# Patient Record
Sex: Female | Born: 1945 | Race: White | Hispanic: No | Smoking: Former smoker
Health system: Southern US, Community
[De-identification: ages and names within clinical notes are randomized; demographics above are authoritative.]

## PROBLEM LIST (undated history)

## (undated) DIAGNOSIS — I10 Essential (primary) hypertension: Secondary | ICD-10-CM

## (undated) DIAGNOSIS — J309 Allergic rhinitis, unspecified: Secondary | ICD-10-CM

## (undated) DIAGNOSIS — Z9889 Other specified postprocedural states: Secondary | ICD-10-CM

## (undated) DIAGNOSIS — R112 Nausea with vomiting, unspecified: Secondary | ICD-10-CM

## (undated) DIAGNOSIS — E785 Hyperlipidemia, unspecified: Secondary | ICD-10-CM

## (undated) DIAGNOSIS — F419 Anxiety disorder, unspecified: Secondary | ICD-10-CM

## (undated) DIAGNOSIS — T8859XA Other complications of anesthesia, initial encounter: Secondary | ICD-10-CM

## (undated) DIAGNOSIS — G629 Polyneuropathy, unspecified: Secondary | ICD-10-CM

## (undated) DIAGNOSIS — M1990 Unspecified osteoarthritis, unspecified site: Secondary | ICD-10-CM

## (undated) DIAGNOSIS — T7840XA Allergy, unspecified, initial encounter: Secondary | ICD-10-CM

## (undated) DIAGNOSIS — T4145XA Adverse effect of unspecified anesthetic, initial encounter: Secondary | ICD-10-CM

## (undated) HISTORY — PX: COLONOSCOPY: SHX174

## (undated) HISTORY — PX: TUBAL LIGATION: SHX77

---

## 2005-09-07 ENCOUNTER — Ambulatory Visit: Payer: Self-pay | Admitting: Internal Medicine

## 2006-09-19 ENCOUNTER — Ambulatory Visit: Payer: Self-pay | Admitting: Internal Medicine

## 2009-01-05 ENCOUNTER — Ambulatory Visit: Payer: Self-pay | Admitting: Internal Medicine

## 2009-05-30 ENCOUNTER — Ambulatory Visit: Payer: Self-pay | Admitting: Gastroenterology

## 2010-01-06 ENCOUNTER — Ambulatory Visit: Payer: Self-pay | Admitting: Internal Medicine

## 2011-02-28 ENCOUNTER — Ambulatory Visit: Payer: Self-pay | Admitting: Internal Medicine

## 2012-03-03 ENCOUNTER — Ambulatory Visit: Payer: Self-pay | Admitting: Internal Medicine

## 2012-03-31 ENCOUNTER — Encounter: Payer: Self-pay | Admitting: Rheumatology

## 2012-04-12 ENCOUNTER — Encounter: Payer: Self-pay | Admitting: Rheumatology

## 2013-03-23 ENCOUNTER — Ambulatory Visit: Payer: Self-pay | Admitting: Internal Medicine

## 2014-04-16 DIAGNOSIS — F411 Generalized anxiety disorder: Secondary | ICD-10-CM | POA: Insufficient documentation

## 2014-04-16 DIAGNOSIS — I10 Essential (primary) hypertension: Secondary | ICD-10-CM | POA: Insufficient documentation

## 2014-04-16 DIAGNOSIS — J302 Other seasonal allergic rhinitis: Secondary | ICD-10-CM | POA: Insufficient documentation

## 2014-05-20 ENCOUNTER — Ambulatory Visit: Payer: Self-pay | Admitting: Family Medicine

## 2015-07-12 ENCOUNTER — Other Ambulatory Visit: Payer: Self-pay | Admitting: Family Medicine

## 2015-07-12 DIAGNOSIS — Z1231 Encounter for screening mammogram for malignant neoplasm of breast: Secondary | ICD-10-CM

## 2015-07-26 ENCOUNTER — Ambulatory Visit
Admission: RE | Admit: 2015-07-26 | Discharge: 2015-07-26 | Disposition: A | Discharge status: Home / Self Care | Payer: PPO | Source: Ambulatory Visit | Attending: Family Medicine | Admitting: Family Medicine

## 2015-07-26 DIAGNOSIS — Z1231 Encounter for screening mammogram for malignant neoplasm of breast: Secondary | ICD-10-CM | POA: Diagnosis not present

## 2015-10-25 ENCOUNTER — Other Ambulatory Visit: Payer: Self-pay | Admitting: Family Medicine

## 2015-10-25 DIAGNOSIS — R1011 Right upper quadrant pain: Secondary | ICD-10-CM

## 2015-10-26 ENCOUNTER — Ambulatory Visit
Admission: RE | Admit: 2015-10-26 | Discharge: 2015-10-26 | Disposition: A | Discharge status: Home / Self Care | Payer: PPO | Source: Ambulatory Visit | Attending: Family Medicine | Admitting: Family Medicine

## 2015-10-26 DIAGNOSIS — R1011 Right upper quadrant pain: Secondary | ICD-10-CM | POA: Diagnosis not present

## 2015-11-02 ENCOUNTER — Emergency Department: Payer: PPO

## 2015-11-02 ENCOUNTER — Encounter: Payer: Self-pay | Admitting: *Deleted

## 2015-11-02 ENCOUNTER — Inpatient Hospital Stay
Admission: EM | Admit: 2015-11-02 | Discharge: 2015-11-04 | DRG: 641 | Disposition: A | Discharge status: Home / Self Care | Payer: PPO | Attending: Specialist | Admitting: Specialist

## 2015-11-02 DIAGNOSIS — E869 Volume depletion, unspecified: Secondary | ICD-10-CM | POA: Diagnosis present

## 2015-11-02 DIAGNOSIS — R634 Abnormal weight loss: Secondary | ICD-10-CM | POA: Diagnosis present

## 2015-11-02 DIAGNOSIS — R1084 Generalized abdominal pain: Secondary | ICD-10-CM

## 2015-11-02 DIAGNOSIS — J309 Allergic rhinitis, unspecified: Secondary | ICD-10-CM | POA: Diagnosis present

## 2015-11-02 DIAGNOSIS — E876 Hypokalemia: Secondary | ICD-10-CM | POA: Diagnosis present

## 2015-11-02 DIAGNOSIS — E871 Hypo-osmolality and hyponatremia: Secondary | ICD-10-CM | POA: Diagnosis present

## 2015-11-02 DIAGNOSIS — F419 Anxiety disorder, unspecified: Secondary | ICD-10-CM | POA: Diagnosis present

## 2015-11-02 DIAGNOSIS — R197 Diarrhea, unspecified: Secondary | ICD-10-CM

## 2015-11-02 DIAGNOSIS — I10 Essential (primary) hypertension: Secondary | ICD-10-CM | POA: Diagnosis present

## 2015-11-02 DIAGNOSIS — F329 Major depressive disorder, single episode, unspecified: Secondary | ICD-10-CM | POA: Diagnosis present

## 2015-11-02 DIAGNOSIS — Z881 Allergy status to other antibiotic agents status: Secondary | ICD-10-CM | POA: Diagnosis not present

## 2015-11-02 DIAGNOSIS — R6881 Early satiety: Secondary | ICD-10-CM | POA: Diagnosis present

## 2015-11-02 DIAGNOSIS — E785 Hyperlipidemia, unspecified: Secondary | ICD-10-CM | POA: Diagnosis present

## 2015-11-02 DIAGNOSIS — Z87891 Personal history of nicotine dependence: Secondary | ICD-10-CM

## 2015-11-02 DIAGNOSIS — T3695XA Adverse effect of unspecified systemic antibiotic, initial encounter: Secondary | ICD-10-CM | POA: Diagnosis present

## 2015-11-02 DIAGNOSIS — K219 Gastro-esophageal reflux disease without esophagitis: Secondary | ICD-10-CM | POA: Diagnosis present

## 2015-11-02 DIAGNOSIS — R112 Nausea with vomiting, unspecified: Secondary | ICD-10-CM | POA: Diagnosis present

## 2015-11-02 DIAGNOSIS — Z79899 Other long term (current) drug therapy: Secondary | ICD-10-CM

## 2015-11-02 DIAGNOSIS — R1110 Vomiting, unspecified: Secondary | ICD-10-CM

## 2015-11-02 DIAGNOSIS — Z6821 Body mass index (BMI) 21.0-21.9, adult: Secondary | ICD-10-CM

## 2015-11-02 DIAGNOSIS — R630 Anorexia: Secondary | ICD-10-CM | POA: Diagnosis present

## 2015-11-02 DIAGNOSIS — R1310 Dysphagia, unspecified: Secondary | ICD-10-CM | POA: Diagnosis present

## 2015-11-02 HISTORY — DX: Hyperlipidemia, unspecified: E78.5

## 2015-11-02 HISTORY — DX: Essential (primary) hypertension: I10

## 2015-11-02 HISTORY — DX: Allergy, unspecified, initial encounter: T78.40XA

## 2015-11-02 HISTORY — DX: Anxiety disorder, unspecified: F41.9

## 2015-11-02 HISTORY — DX: Unspecified osteoarthritis, unspecified site: M19.90

## 2015-11-02 HISTORY — DX: Allergic rhinitis, unspecified: J30.9

## 2015-11-02 LAB — URINALYSIS COMPLETE WITH MICROSCOPIC (ARMC ONLY)
Bilirubin Urine: NEGATIVE
Glucose, UA: NEGATIVE mg/dL
Ketones, ur: NEGATIVE mg/dL
NITRITE: NEGATIVE
PH: 6 (ref 5.0–8.0)
Protein, ur: NEGATIVE mg/dL
Specific Gravity, Urine: 1.008 (ref 1.005–1.030)

## 2015-11-02 LAB — COMPREHENSIVE METABOLIC PANEL
ALBUMIN: 4.2 g/dL (ref 3.5–5.0)
ALT: 26 U/L (ref 14–54)
ANION GAP: 10 (ref 5–15)
AST: 29 U/L (ref 15–41)
Alkaline Phosphatase: 43 U/L (ref 38–126)
BUN: 9 mg/dL (ref 6–20)
CHLORIDE: 87 mmol/L — AB (ref 101–111)
CO2: 23 mmol/L (ref 22–32)
Calcium: 8.7 mg/dL — ABNORMAL LOW (ref 8.9–10.3)
Creatinine, Ser: 0.78 mg/dL (ref 0.44–1.00)
GFR calc non Af Amer: >60 mL/min (ref >60)
GLUCOSE: 144 mg/dL — AB (ref 65–99)
POTASSIUM: 3 mmol/L — AB (ref 3.5–5.1)
SODIUM: 120 mmol/L — AB (ref 135–145)
Total Bilirubin: 0.5 mg/dL (ref 0.3–1.2)
Total Protein: 6.8 g/dL (ref 6.5–8.1)

## 2015-11-02 LAB — CBC WITH DIFFERENTIAL/PLATELET
BASOS PCT: 0 %
Basophils Absolute: 0 10*3/uL (ref 0–0.1)
EOS ABS: 0 10*3/uL (ref 0–0.7)
EOS PCT: 0 %
HCT: 36.6 % (ref 35.0–47.0)
HEMOGLOBIN: 12.4 g/dL (ref 12.0–16.0)
Lymphocytes Relative: 22 %
Lymphs Abs: 1 10*3/uL (ref 1.0–3.6)
MCH: 31.4 pg (ref 26.0–34.0)
MCHC: 33.9 g/dL (ref 32.0–36.0)
MCV: 92.6 fL (ref 80.0–100.0)
MONO ABS: 0.5 10*3/uL (ref 0.2–0.9)
MONOS PCT: 11 %
NEUTROS PCT: 67 %
Neutro Abs: 3.2 10*3/uL (ref 1.4–6.5)
PLATELETS: 335 10*3/uL (ref 150–440)
RBC: 3.95 MIL/uL (ref 3.80–5.20)
RDW: 13.2 % (ref 11.5–14.5)
WBC: 4.8 10*3/uL (ref 3.6–11.0)

## 2015-11-02 LAB — LIPASE, BLOOD: Lipase: 51 U/L (ref 11–51)

## 2015-11-02 LAB — MAGNESIUM: MAGNESIUM: 1.8 mg/dL (ref 1.7–2.4)

## 2015-11-02 LAB — TROPONIN I

## 2015-11-02 MED ORDER — FLUTICASONE PROPIONATE 50 MCG/ACT NA SUSP
1.0000 | Freq: Every day | NASAL | Status: DC
  Administered 2015-11-03: 21:00:00 2 via NASAL
  Filled 2015-11-02 (×2): qty 16

## 2015-11-02 MED ORDER — TRAMADOL HCL 50 MG PO TABS
50.0000 mg | ORAL_TABLET | Freq: Two times a day (BID) | ORAL | Status: DC | PRN
  Administered 2015-11-03: 50 mg via ORAL
  Filled 2015-11-02: qty 1

## 2015-11-02 MED ORDER — ONDANSETRON HCL 4 MG/2ML IJ SOLN: 4.0000 mg | Freq: Four times a day (QID) | INTRAMUSCULAR | Status: DC | PRN

## 2015-11-02 MED ORDER — ONDANSETRON HCL 4 MG PO TABS: 4.0000 mg | ORAL_TABLET | Freq: Four times a day (QID) | ORAL | Status: DC | PRN

## 2015-11-02 MED ORDER — LORAZEPAM 0.5 MG PO TABS: 0.5000 mg | ORAL_TABLET | Freq: Every day | ORAL | Status: DC

## 2015-11-02 MED ORDER — LORAZEPAM 0.5 MG PO TABS
0.5000 mg | ORAL_TABLET | Freq: Every day | ORAL | Status: DC
  Administered 2015-11-02 – 2015-11-03 (×2): 0.5 mg via ORAL
  Filled 2015-11-02 (×2): qty 1

## 2015-11-02 MED ORDER — ONDANSETRON HCL 4 MG/2ML IJ SOLN: 4.0000 mg | Freq: Once | INTRAMUSCULAR | Status: DC

## 2015-11-02 MED ORDER — SIMVASTATIN 20 MG PO TABS: 20.0000 mg | ORAL_TABLET | Freq: Every day | ORAL | Status: DC

## 2015-11-02 MED ORDER — CITALOPRAM HYDROBROMIDE 20 MG PO TABS
20.0000 mg | ORAL_TABLET | Freq: Every day | ORAL | Status: DC
  Administered 2015-11-03: 09:00:00 20 mg via ORAL
  Filled 2015-11-02: qty 1

## 2015-11-02 MED ORDER — ENOXAPARIN SODIUM 40 MG/0.4ML ~~LOC~~ SOLN
40.0000 mg | SUBCUTANEOUS | Status: DC
  Administered 2015-11-02: 22:00:00 40 mg via SUBCUTANEOUS
  Filled 2015-11-02: qty 0.4

## 2015-11-02 MED ORDER — ACETAMINOPHEN 650 MG RE SUPP: 650.0000 mg | Freq: Four times a day (QID) | RECTAL | Status: DC | PRN

## 2015-11-02 MED ORDER — LOSARTAN POTASSIUM 50 MG PO TABS
50.0000 mg | ORAL_TABLET | Freq: Every day | ORAL | Status: DC
  Administered 2015-11-02 – 2015-11-03 (×2): 50 mg via ORAL
  Filled 2015-11-02: qty 1
  Filled 2015-11-02: qty 2

## 2015-11-02 MED ORDER — ACETAMINOPHEN 325 MG PO TABS: 650.0000 mg | ORAL_TABLET | Freq: Four times a day (QID) | ORAL | Status: DC | PRN

## 2015-11-02 MED ORDER — SIMVASTATIN 20 MG PO TABS
20.0000 mg | ORAL_TABLET | Freq: Every day | ORAL | Status: DC
  Administered 2015-11-02 – 2015-11-03 (×2): 20 mg via ORAL
  Filled 2015-11-02 (×2): qty 1

## 2015-11-02 MED ORDER — POTASSIUM CHLORIDE IN NACL 40-0.9 MEQ/L-% IV SOLN
INTRAVENOUS | Status: DC
  Administered 2015-11-02 – 2015-11-04 (×4): 100 mL/h via INTRAVENOUS
  Filled 2015-11-02 (×6): qty 1000

## 2015-11-02 MED ORDER — PANTOPRAZOLE SODIUM 40 MG PO TBEC
40.0000 mg | DELAYED_RELEASE_TABLET | Freq: Two times a day (BID) | ORAL | Status: DC
  Administered 2015-11-02 – 2015-11-03 (×3): 40 mg via ORAL
  Filled 2015-11-02 (×3): qty 1

## 2015-11-02 MED ORDER — MONTELUKAST SODIUM 10 MG PO TABS
10.0000 mg | ORAL_TABLET | Freq: Every day | ORAL | Status: DC
  Administered 2015-11-03: 10 mg via ORAL
  Filled 2015-11-02: qty 1

## 2015-11-02 MED ORDER — SODIUM CHLORIDE 0.9 % IV BOLUS (SEPSIS)
1000.0000 mL | Freq: Once | INTRAVENOUS | Status: AC
  Administered 2015-11-02: 1000 mL via INTRAVENOUS

## 2015-11-02 NOTE — ED Notes (Signed)
Pt arrives via EMS with nausea, vomiting, and diaherra, states last Tuesday she was diagnoised with h.pylori at [facility] clinic, EMS gave 8 mg IV zofran in route

## 2015-11-02 NOTE — H&P (Signed)
[facility] at [place] NAME: Brittney Ramsey    MR#:  [id number]  DATE OF BIRTH:  [birth year]  DATE OF ADMISSION:  11/02/2015  PRIMARY CARE PHYSICIAN: BABAOFF, Caryl Bis, MD   REQUESTING/REFERRING PHYSICIAN: Dr Clearnce Hasten  CHIEF COMPLAINT:  Nausea vomiting diarrhea for 3-4 days.  HISTORY OF PRESENT ILLNESS:  Brittney Ramsey  is a 69 y.o. female with a known history of hyperlipidemia, anxiety, hypertension, allergic rhinitis who was recently diagnosed with H. pylori as outpatient. Patient was started on amoxicillin, Flagyl and Prevacid by her primary care physician on 10/25/2015. Patient thereafter started having some nausea vomiting intermittently along with diarrhea. She came to the emergency room feeling very weak was found to have sodium of 120 and potassium of 3.0. She clinically appears dehydrated she is being admitted for further evaluation and management  PAST MEDICAL HISTORY:  Hypertension History of hemorrhoids Hyperlipidemia Osteoarthritis  PAST SURGICAL HISTOIRY:  History reviewed. No pertinent past surgical history.  SOCIAL HISTORY:   Social History  Substance Use Topics  . Smoking status: Not on file  . Smokeless tobacco: Not on file  . Alcohol Use: Not on file    FAMILY HISTORY:  Hypertension  DRUG ALLERGIES:   Allergies  Allergen Reactions  . Biaxin [Clarithromycin]     Reaction: stomach cramps  . Erythromycin     Reaction: stomach cramps    REVIEW OF SYSTEMS:  Review of Systems  Constitutional: Positive for malaise/fatigue. Negative for fever, chills and weight loss.  HENT: Negative for ear discharge, ear pain and nosebleeds.   Eyes: Negative for blurred vision, pain and discharge.  Respiratory: Negative for sputum production, shortness of breath, wheezing and stridor.   Cardiovascular: Negative for chest pain, palpitations, orthopnea and PND.  Gastrointestinal: Positive for nausea, vomiting and diarrhea.  Negative for abdominal pain.  Genitourinary: Negative for urgency and frequency.  Musculoskeletal: Negative for back pain and joint pain.  Neurological: Positive for weakness. Negative for sensory change, speech change and focal weakness.  Psychiatric/Behavioral: Negative for depression and hallucinations. The patient is not nervous/anxious.   All other systems reviewed and are negative.    MEDICATIONS AT HOME:   Prior to Admission medications   Medication Sig Start Date End Date Taking? Authorizing Provider  amoxicillin (AMOXIL) 500 MG capsule Take 1,000 mg by mouth 2 (two) times daily. For 14 days 10/26/15  Yes Historical Provider, MD  citalopram (CELEXA) 20 MG tablet Take 20 mg by mouth daily.   Yes Historical Provider, MD  fluticasone (FLONASE) 50 MCG/ACT nasal spray Place 1-2 sprays into both nostrils daily at 8 pm.   Yes Historical Provider, MD  LORazepam (ATIVAN) 0.5 MG tablet Take 0.5 mg by mouth daily.   Yes Historical Provider, MD  losartan-hydrochlorothiazide (HYZAAR) 50-12.5 MG tablet Take 1 tablet by mouth daily.   Yes Historical Provider, MD  meloxicam (MOBIC) 7.5 MG tablet Take 7.5 mg by mouth daily at 8 pm.   Yes Historical Provider, MD  metroNIDAZOLE (FLAGYL) 500 MG tablet Take 500 mg by mouth 2 (two) times daily. For 14 days 10/26/15  Yes Historical Provider, MD  montelukast (SINGULAIR) 10 MG tablet Take 10 mg by mouth daily.   Yes Historical Provider, MD  pantoprazole (PROTONIX) 40 MG tablet Take 40 mg by mouth 2 (two) times daily.   Yes Historical Provider, MD  simvastatin (ZOCOR) 20 MG tablet Take 20 mg by mouth daily at 6 PM.   Yes Historical Provider, MD  traMADol (ULTRAM) 50 MG tablet Take 50 mg by mouth 2 (two) times daily as needed.   Yes Historical Provider, MD      VITAL SIGNS:  Blood pressure 156/61, pulse 78, temperature 97.7 F (36.5 C), temperature source Oral, resp. rate 12, height 5\' 3"  (1.6 m), weight 62.596 kg (138 lb), SpO2 97 %.  PHYSICAL  EXAMINATION:  GENERAL:  69 y.o.-year-old patient lying in the bed with no acute distress.  EYES: Pupils equal, round, reactive to light and accommodation. No scleral icterus. Extraocular muscles intact. Dry oral mucosa HEENT: Head atraumatic, normocephalic. Oropharynx and nasopharynx clear.  NECK:  Supple, no jugular venous distention. No thyroid enlargement, no tenderness.  LUNGS: Normal breath sounds bilaterally, no wheezing, rales,rhonchi or crepitation. No use of accessory muscles of respiration.  CARDIOVASCULAR: S1, S2 normal. No murmurs, rubs, or gallops.  ABDOMEN: Soft, nontender, nondistended. Bowel sounds present. No organomegaly or mass.  EXTREMITIES: No pedal edema, cyanosis, or clubbing.  NEUROLOGIC: Cranial nerves II through XII are intact. Muscle strength 5/5 in all extremities. Sensation intact. Gait not checked. Appears weak but PSYCHIATRIC: The patient is alert and oriented x 3.  SKIN: No obvious rash, lesion, or ulcer.   LABORATORY PANEL:   CBC  Recent Labs Lab 11/02/15 1456  WBC 4.8  HGB 12.4  HCT 36.6  PLT 335   ------------------------------------------------------------------------------------------------------------------  Chemistries   Recent Labs Lab 11/02/15 1456  NA 120*  K 3.0*  CL 87*  CO2 23  GLUCOSE 144*  BUN 9  CREATININE 0.78  CALCIUM 8.7*  MG 1.8  AST 29  ALT 26  ALKPHOS 43  BILITOT 0.5   ------------------------------------------------------------------------------------------------------------------  Cardiac Enzymes  Recent Labs Lab 11/02/15 1456  TROPONINI <0.03   ------------------------------------------------------------------------------------------------------------------  RADIOLOGY:  Dg Chest 1 View  11/02/2015  CLINICAL DATA:  Nausea vomiting and diarrhea; history of recent diagnosis of Helicobacter pylori. EXAM: CHEST 1 VIEW COMPARISON:  None in PACs FINDINGS: The lungs are well-expanded and clear. The heart and  pulmonary vascularity are normal. The mediastinum is normal in width. There is no pleural effusion. The bony thorax exhibits no acute abnormality. IMPRESSION: There is no active cardiopulmonary disease. Electronically Signed   By: David  Martinique M.D.   On: 11/02/2015 16:02    EKG:   Sinus rhythm IMPRESSION AND PLAN:  Brittney Ramsey  is a 69 y.o. female with a known history of hyperlipidemia, anxiety, hypertension, allergic rhinitis who was recently diagnosed with H. pylori as outpatient. Patient was started on amoxicillin, Flagyl and Prevacid by her primary care physician on 10/25/2015 Patient is being admitted with  1. Acute hyponatremia, hypokalemia, hypochloremia secondary to GI losses likely from side effect of treatment of H. pylori medications -Patient was started on amoxicillin, Flagyl, Prevacid 10/25/2015 by PCP. Patient internally started having nausea vomiting diarrhea. -Admit to medical floor -Clear liquid diet -IV normal saline with KCl. Check labs in the morning. Check magnesium. -Advance diet as tolerated  2. Hyperlipidemia continue simvastatin  3. Hypertension We'll continue losartan. I'll hold off on hydrochlorothiazide given dehydration and hyponatremia for now  4. H. pylori recent diagnosis Consider prescribing different regimen prior to discharge.   5. DVT prophylaxis Lovenox    Above was discussed with patient and patient's daughter in the ER.   All the records are reviewed and case discussed with ED provider. Management plans discussed with the patient, family and they are in agreement.  CODE STATUS: Full code   TOTAL TIME TAKING CARE OF THIS PATIENT: 68*  minutes.    Brittney Ramsey M.D on 11/02/2015 at 5:57 PM  Between 7am to 6pm - Pager - [phone]  After 6pm go to [email or web address] - password [email or web address] [place]  Office  [phone]  CC: Primary care physician; BABAOFF, Caryl Bis, MD

## 2015-11-02 NOTE — ED Provider Notes (Signed)
[facility] Emergency Department Provider Note  ____________________________________________  Time seen: Approximately 315 PM  I have reviewed the triage vital signs and the nursing notes.   HISTORY  Chief Complaint Nausea; Emesis; and Diarrhea    HPI Brittney Ramsey is a 69 y.o. female with a recent diagnosis of H. pylori and is presenting today with nausea vomiting and diarrhea. She says that she started taking amoxicillin and Flagyl last Tuesday and then almost immediately began having the nausea vomiting and diarrhea. She says she may have up to 4 episodes of diarrhea per day. Says she had none yesterday. However, resumed today. Denies any blood in the stool or vomitus. Denies any bilious vomiting. Says that she also has diffuse abdominal cramping. Denies any shortness of breath or chest pain. She also had a recent, and negative, right upper quadrant ultrasound.Says she decided to come in to the emergency department day because she became so weak over the last 2 weeks that she wasn't able to get up from her commode today without assistance. Also notes 17 pound weight loss over the past 4 months that was unintentional.   History reviewed. No pertinent past medical history.  There are no active problems to display for this patient.   History reviewed. No pertinent past surgical history.  Current Outpatient Rx  Name  Route  Sig  Dispense  Refill  . amoxicillin (AMOXIL) 500 MG capsule   Oral   Take 1,000 mg by mouth 2 (two) times daily. For 14 days      0   . citalopram (CELEXA) 20 MG tablet   Oral   Take 20 mg by mouth daily.         . fluticasone (FLONASE) 50 MCG/ACT nasal spray   Each Nare   Place 1-2 sprays into both nostrils daily at 8 pm.      5   . LORazepam (ATIVAN) 0.5 MG tablet   Oral   Take 0.5 mg by mouth daily.      5   . losartan-hydrochlorothiazide (HYZAAR) 50-12.5 MG tablet   Oral   Take 1 tablet by mouth daily.      1   . meloxicam (MOBIC) 7.5 MG tablet   Oral   Take 7.5 mg by mouth daily at 8 pm.      5   . metroNIDAZOLE (FLAGYL) 500 MG tablet   Oral   Take 500 mg by mouth 2 (two) times daily. For 14 days      0   . montelukast (SINGULAIR) 10 MG tablet   Oral   Take 10 mg by mouth daily.      3   . pantoprazole (PROTONIX) 40 MG tablet   Oral   Take 40 mg by mouth 2 (two) times daily.      3   . simvastatin (ZOCOR) 20 MG tablet   Oral   Take 20 mg by mouth daily at 6 PM.      3   . traMADol (ULTRAM) 50 MG tablet   Oral   Take 50 mg by mouth 2 (two) times daily as needed.      0     Allergies Biaxin and Erythromycin  History reviewed. No pertinent family history.  Social History Social History  Substance Use Topics  . Smoking status: None  . Smokeless tobacco: None  . Alcohol Use: None    Review of Systems Constitutional: No fever/chills Eyes: No visual changes. ENT: No sore throat.  Cardiovascular: Denies chest pain. Respiratory: Denies shortness of breath. Gastrointestinal: No constipation. Genitourinary: Negative for dysuria. Musculoskeletal: Negative for back pain. Skin: Negative for rash. Neurological: Negative for headaches, focal weakness or numbness.  10-point ROS otherwise negative.  ____________________________________________   PHYSICAL EXAM:  VITAL SIGNS: ED Triage Vitals  Enc Vitals Group     BP 11/02/15 1454 199/77 mmHg     Pulse Rate 11/02/15 1454 74     Resp 11/02/15 1454 18     Temp 11/02/15 1454 97.7 F (36.5 C)     Temp Source 11/02/15 1454 Oral     SpO2 11/02/15 1451 98 %     Weight 11/02/15 1454 138 lb (62.596 kg)     Height 11/02/15 1454 5\' 3"  (1.6 m)     Head Cir --      Peak Flow --      Pain Score 11/02/15 1454 9     Pain Loc --      Pain Edu? --      Excl. in [place]? --     Constitutional: Alert and oriented. Well appearing and in no acute distress. Eyes: Conjunctivae are normal. PERRL. EOMI. Head: Atraumatic. Nose: No  congestion/rhinnorhea. Mouth/Throat: Mucous membranes are moist.  Oropharynx non-erythematous. Neck: No stridor.   Cardiovascular: Normal rate, regular rhythm. Grossly normal heart sounds.  Good peripheral circulation. Respiratory: Normal respiratory effort.  No retractions. Lungs CTAB. Gastrointestinal: Soft with diffuse tenderness palpation. No distention. No CVA tenderness. Musculoskeletal: No lower extremity tenderness nor edema.  No joint effusions. Neurologic:  Normal speech and language. No gross focal neurologic deficits are appreciated. Patient is tremulous to her bilateral upper extremities. Skin:  Skin is warm, dry and intact. No rash noted. Psychiatric: Mood and affect are normal. Speech and behavior are normal.  ____________________________________________   LABS (all labs ordered are listed, but only abnormal results are displayed)  Labs Reviewed  COMPREHENSIVE METABOLIC PANEL - Abnormal; Notable for the following:    Sodium 120 (*)    Potassium 3.0 (*)    Chloride 87 (*)    Glucose, Bld 144 (*)    Calcium 8.7 (*)    All other components within normal limits  URINALYSIS COMPLETEWITH MICROSCOPIC ([facility] ONLY) - Abnormal; Notable for the following:    Color, Urine YELLOW (*)    APPearance HAZY (*)    Hgb urine dipstick 2+ (*)    Leukocytes, UA TRACE (*)    Bacteria, UA MANY (*)    Squamous Epithelial / LPF 0-5 (*)    All other components within normal limits  CBC WITH DIFFERENTIAL/PLATELET  LIPASE, BLOOD  TROPONIN I  MAGNESIUM   ____________________________________________  EKG  ED ECG REPORT I, Doran Stabler, the attending physician, personally viewed and interpreted this ECG.   Date: 11/02/2015  EKG Time: 1454  Rate: 73  Rhythm: normal EKG, normal sinus rhythm  Axis: Normal axis  Intervals:none  ST&T Change: No ST segment elevation or depression. No abnormal T-wave inversion.  ____________________________________________  RADIOLOGY  No acute  findings on the chest x-ray. ____________________________________________   PROCEDURES  CRITICAL CARE Performed by: Doran Stabler   Total critical care time: 35 minutes  Critical care time was exclusive of separately billable procedures and treating other patients.  Critical care was necessary to treat or prevent imminent or life-threatening deterioration.  Critical care was time spent personally by me on the following activities: development of treatment plan with patient and/or surrogate as well as nursing, discussions  with consultants, evaluation of patient's response to treatment, examination of patient, obtaining history from patient or surrogate, ordering and performing treatments and interventions, ordering and review of laboratory studies, ordering and review of radiographic studies, pulse oximetry and re-evaluation of patient's condition.   ____________________________________________   INITIAL IMPRESSION / ASSESSMENT AND PLAN / ED COURSE  Pertinent labs & imaging results that were available during my care of the patient were reviewed by me and considered in my medical decision making (see chart for details).  ----------------------------------------- 5:32 PM on 11/02/2015 -----------------------------------------  Patient without any vomiting throughout the ED visit after Zofran. Found to have sodium of 120 likely secondary to the vomiting and diarrhea. We'll admit to the hospital for further electrolyte correction. Feel that the diarrhea and vomiting are likely secondary to antibiotic side effects. The time when does not fit with C. difficile. Patient and family aware of need for admission. Signed out to Dr. Posey Pronto. ____________________________________________   FINAL CLINICAL IMPRESSION(S) / ED DIAGNOSES  Nausea vomiting and diarrhea with diffuse abdominal pain. Hyponatremia.    Orbie Pyo, MD 11/02/15 [phone]

## 2015-11-02 NOTE — ED Notes (Signed)
Admitting at bedside 

## 2015-11-03 LAB — TSH: TSH: 1.258 u[IU]/mL (ref 0.350–4.500)

## 2015-11-03 LAB — BASIC METABOLIC PANEL
Anion gap: 8 (ref 5–15)
BUN: 6 mg/dL (ref 6–20)
CO2: 26 mmol/L (ref 22–32)
CREATININE: 0.7 mg/dL (ref 0.44–1.00)
Calcium: 8.6 mg/dL — ABNORMAL LOW (ref 8.9–10.3)
Chloride: 94 mmol/L — ABNORMAL LOW (ref 101–111)
GFR calc Af Amer: >60 mL/min (ref >60)
Glucose, Bld: 106 mg/dL — ABNORMAL HIGH (ref 65–99)
POTASSIUM: 3.5 mmol/L (ref 3.5–5.1)
SODIUM: 128 mmol/L — AB (ref 135–145)

## 2015-11-03 MED ORDER — BOOST / RESOURCE BREEZE PO LIQD
1.0000 | Freq: Three times a day (TID) | ORAL | Status: DC
  Administered 2015-11-03 (×2): 1 via ORAL

## 2015-11-03 NOTE — Progress Notes (Signed)
[facility] at [place] NAME: Marnee Wilshusen    MR#:  [id number]  DATE OF BIRTH:  06/02/1946  SUBJECTIVE:   Patient here due to nausea and vomiting and noted to be hypokalemic and also hyponatremic. Feels a bit better today and electrolytes have improved.  REVIEW OF SYSTEMS:    Review of Systems  Constitutional: Negative for fever and chills.  HENT: Negative for congestion and tinnitus.   Eyes: Negative for blurred vision and double vision.  Respiratory: Negative for cough, shortness of breath and wheezing.   Cardiovascular: Negative for chest pain, orthopnea and PND.  Gastrointestinal: Negative for nausea, vomiting, abdominal pain and diarrhea.  Genitourinary: Negative for dysuria and hematuria.  Neurological: Negative for dizziness, sensory change and focal weakness.  All other systems reviewed and are negative.   Nutrition: Clear liquids Tolerating Diet: Yes Tolerating PT: Ambulatory.      DRUG ALLERGIES:   Allergies  Allergen Reactions  . Biaxin [Clarithromycin]     Reaction: stomach cramps  . Erythromycin     Reaction: stomach cramps    VITALS:  Blood pressure 156/60, pulse 69, temperature 98.5 F (36.9 C), temperature source Oral, resp. rate 20, height 5\' 3"  (1.6 m), weight 55.475 kg (122 lb 4.8 oz), SpO2 100 %.  PHYSICAL EXAMINATION:   Physical Exam  GENERAL:  69 y.o.-year-old patient lying in the bed with no acute distress.  EYES: Pupils equal, round, reactive to light and accommodation. No scleral icterus. Extraocular muscles intact.  HEENT: Head atraumatic, normocephalic. Oropharynx and nasopharynx clear.  NECK:  Supple, no jugular venous distention. No thyroid enlargement, no tenderness.  LUNGS: Normal breath sounds bilaterally, no wheezing, rales, rhonchi. No use of accessory muscles of respiration.  CARDIOVASCULAR: S1, S2 normal. No murmurs, rubs, or gallops.  ABDOMEN: Soft, nontender, nondistended.  Bowel sounds present. No organomegaly or mass.  EXTREMITIES: No cyanosis, clubbing or edema b/l.    NEUROLOGIC: Cranial nerves II through XII are intact. No focal Motor or sensory deficits b/l.   PSYCHIATRIC: The patient is alert and oriented x 3.  SKIN: No obvious rash, lesion, or ulcer.    LABORATORY PANEL:   CBC  Recent Labs Lab 11/02/15 1456  WBC 4.8  HGB 12.4  HCT 36.6  PLT 335   ------------------------------------------------------------------------------------------------------------------  Chemistries   Recent Labs Lab 11/02/15 1456 11/03/15 0729  NA 120* 128*  K 3.0* 3.5  CL 87* 94*  CO2 23 26  GLUCOSE 144* 106*  BUN 9 6  CREATININE 0.78 0.70  CALCIUM 8.7* 8.6*  MG 1.8  --   AST 29  --   ALT 26  --   ALKPHOS 43  --   BILITOT 0.5  --    ------------------------------------------------------------------------------------------------------------------  Cardiac Enzymes  Recent Labs Lab 11/02/15 1456  TROPONINI <0.03   ------------------------------------------------------------------------------------------------------------------  RADIOLOGY:  Dg Chest 1 View  11/02/2015  CLINICAL DATA:  Nausea vomiting and diarrhea; history of recent diagnosis of Helicobacter pylori. EXAM: CHEST 1 VIEW COMPARISON:  None in PACs FINDINGS: The lungs are well-expanded and clear. The heart and pulmonary vascularity are normal. The mediastinum is normal in width. There is no pleural effusion. The bony thorax exhibits no acute abnormality. IMPRESSION: There is no active cardiopulmonary disease. Electronically Signed   By: David  Martinique M.D.   On: 11/02/2015 16:02     ASSESSMENT AND PLAN:   69 year old female with past medical history of hypertension, anxiety, hyperlipidemia, history of allergic rhinitis who presented  to the hospital with nausea vomiting and noted to be hypokalemic and hyponatremic.  #1 hyponatremia-this was likely secondary to volume loss from nausea  and vomiting. -Continue IV fluids and it has improved.  #2 hypokalemia-also due to the intractable nausea vomiting. -It has been supplemented and is improving and we will monitor.  #3 intractable nausea and vomiting-due to the antibiotics she was taking to treat her H. pylori infection -hold Flagyl and amoxicillin. Continue supportive care with IV fluids, antiemetics. -Tolerating clear liquid diet well. We'll advance to soft diet. -I will get a gastroenterology consult and discussed the case with Dr. Candace Cruise and he plans on possibly doing an endoscopy tomorrow. I will make her nothing by mouth after midnight.  #4 hyperlipidemia-continue simvastatin.  #5 depression-continue Celexa.  #6 anxiety-continue Ativan.  #7 hypertension-continue losartan.   All the records are reviewed and case discussed with Care Management/Social Workerr. Management plans discussed with the patient, family and they are in agreement.  CODE STATUS: Full  DVT Prophylaxis: Lovenox  TOTAL TIME TAKING CARE OF THIS PATIENT: 30 minutes.   POSSIBLE D/C IN 1-2 DAYS, DEPENDING ON CLINICAL CONDITION.   Henreitta Leber M.D on 11/03/2015 at 2:28 PM  Between 7am to 6pm - Pager - [phone]  After 6pm go to [email or web address] - password [email or web address] [place]  Office  [phone]  CC: Primary care physician; BABAOFF, Caryl Bis, MD

## 2015-11-03 NOTE — Progress Notes (Signed)
Initial Nutrition Assessment   INTERVENTION:   Coordination of Care: await diet progression as medically able Medical Food Supplement Therapy: will recommend Boost Breeze po TID, each supplement provides 250 kcal and 9 grams of protein and will advance to chocolate Ensure as pt prefers once diet order advanced   NUTRITION DIAGNOSIS:   Inadequate oral intake related to poor appetite as evidenced by per patient/family report.  GOAL:   Patient will meet greater than or equal to 90% of their needs  MONITOR:    (Energy Intake, Electrolyte and Renal Profile, Digestive System,  Anthropometrics)  REASON FOR ASSESSMENT:   Malnutrition Screening Tool    ASSESSMENT:   Pt admitted with 3-4 days n/v and intermittent diarrhea per MD note with recent h. pylori. Pt with hyponatremia and hypokalemia likely secondary to GI losses from h.pylori medications per MD note.  Past Medical History  Diagnosis Date  . Anxiety   . Hyperlipemia   . Hypertension   . Allergic rhinitis   . Allergy   . Arthritis      Diet Order:  Diet clear liquid Room service appropriate?: Yes; Fluid consistency:: Thin   Current Nutrition: Pt ate 50-70% of CL this am and tolerated well.   Food/Nutrition-Related History: Pt reports poor appetite slowly started back in June 2016. More recently pt c/o dry mouth and reports was not able to even get a cracker down yesterday. Pt reports trying to eat a couple of meals a day usually bland foods. Pt gave the example of 1/2 sandwich and a few chips for lunch or a bowl of potato soup. Pt reports drinking Ensure daily however an outpatient RN recommended stopping while pt was having n/v/d.    Scheduled Medications:  . citalopram  20 mg Oral Daily  . enoxaparin (LOVENOX) injection  40 mg Subcutaneous Q24H  . feeding supplement  1 Container Oral TID WC  . fluticasone  1-2 spray Each Nare Q2000  . LORazepam  0.5 mg Oral QHS  . losartan  50 mg Oral Daily  . montelukast  10  mg Oral Daily  . pantoprazole  40 mg Oral BID  . simvastatin  20 mg Oral QHS    Continuous Medications:  . 0.9 % NaCl with KCl 40 mEq / L 100 mL/hr (11/03/15 0839)     Electrolyte/Renal Profile and Glucose Profile:   Recent Labs Lab 11/02/15 1456 11/03/15 0729  NA 120* 128*  K 3.0* 3.5  CL 87* 94*  CO2 23 26  BUN 9 6  CREATININE 0.78 0.70  CALCIUM 8.7* 8.6*  MG 1.8  --   GLUCOSE 144* 106*   Protein Profile:  Recent Labs Lab 11/02/15 1456  ALBUMIN 4.2    Gastrointestinal Profile: Last BM:  11/02/2015   Nutrition-Focused Physical Exam Findings: Nutrition-Focused physical exam completed. Findings are WDL for fat depletion, muscle depletion, and edema.    Weight Change: Pt reports weight of 138lbs last February-March (11% weight loss in 9-10 months)   Skin:  Reviewed, no issues   Height:   Ht Readings from Last 1 Encounters:  11/02/15 5\' 3"  (1.6 m)    Weight:   Wt Readings from Last 1 Encounters:  11/02/15 122 lb 4.8 oz (55.475 kg)    Wt Readings from Last 10 Encounters:  11/02/15 122 lb 4.8 oz (55.475 kg)    BMI:  Body mass index is 21.67 kg/(m^2).  Estimated Nutritional Needs:   Kcal:  BEE: 1040kcals, TEE: (IF 1.0-1.2)(AF 1.3) 1352-1623kcals  Protein:  44-55g protein (0.8-1.0g/kg)  Fluid:  1388-1658mL of fluid (25-12mL/kg)  EDUCATION NEEDS:   No education needs identified at this time   Merkel, RD, LDN Pager [phone] Weekend/On-Call Pager [phone]

## 2015-11-03 NOTE — Plan of Care (Signed)
Problem: Safety: Goal: Ability to remain free from injury will improve Outcome: Progressing Ambulates with assist. Call bell within reach.  Problem: Pain Managment: Goal: General experience of comfort will improve Outcome: Progressing No complaints of pain.

## 2015-11-03 NOTE — Plan of Care (Signed)
Problem: Education: Goal: Knowledge of [facility] Education information/materials will improve Outcome: Progressing Has received General Education Handout. Oriented to Oncology unit. Instructed and Demonstrated how to use phone to call RN and CNA for Assistance. Verbalized and Demonstrated Understanding.  Problem: Safety: Goal: Ability to remain free from injury will improve Outcome: Progressing Moderate fall risk. Instructed and Demonstrated how to use phone to call RN and CNA for Assistance. Verbalized and Demonstrated Understanding. Remained free of injury of fall this shift.  Problem: Pain Managment: Goal: General experience of comfort will improve Outcome: Progressing Denies Pain.  Problem: Physical Regulation: Goal: Ability to maintain clinical measurements within normal limits will improve Outcome: Progressing Remains on 0.9 % NaCl with KCl 40 mEq / L  infusion at 166mL/hr. Potassium WDL's. Sodium improving. AM Labs.  Problem: Tissue Perfusion: Goal: Risk factors for ineffective tissue perfusion will decrease Outcome: Progressing Refuses Teds. Refuases SCD's. Encouraged Ambulation.  Problem: Activity: Goal: Risk for activity intolerance will decrease Outcome: Progressing Activity as Tolerated.  Problem: Fluid Volume: Goal: Ability to maintain a balanced intake and output will improve Outcome: Progressing Remains on 0.9 % NaCl with KCl 40 mEq / L infusion at 181mL/hr. Encouraged Oral intake

## 2015-11-03 NOTE — Consult Note (Signed)
GI Inpatient Consult Note  Reason for Consult: + H. Pylori, N/V    Attending Requesting Consult: Dr. Verdell Carmine   History of Present Illness:  Brittney Ramsey is a 69 y.o. female seen for evaluation of + h. Pylori, nausea, vomiting at the request of Dr. Verdell Carmine. PMhx of HTN, HLD, anxiety. Daughter-in-law at bedside and helps w/ history.   She reports about 4 months ago developing anorexia and epigastric pain. Describes lack of tastes with foods, no appetite. She has burning epigastric pain not clearly worse w/ PO intake. She feels foods, pills, etc sit in lower esophagus will eventually pass. Also having early satiety. Weight is down total of #15lbs unintentionally. She has reflux and frequent belching despite protonix, takes qd some days and BID other days. She takes mobic and aspirin 81mg  but denies use of other NSAIDs.   Chronic history of alternating bowel habits, more predominately constipation with up to 3-4 days between BM. Will take stool softeners & miralax PRN. Has trouble with laxatives cause diarrhea at times.   Work up for UGI symptoms at PCP included RUQ US unremarkable. H. Pylori AB positive and started on amoxicillin, flagyl, and PPI. Nausea worse w/ ABX and developed severe vomiting and diarrhea after 4-5 days of ABX which prompted admission. Last dose of ABX yesterday AM. Vomiting resolved since admission. 2 soft BM today.    Last Colonoscopy: Dr. Gustavo Lah - 05/2009 - normal   Last Endoscopy: None prior.    Past Medical History:  Past Medical History  Diagnosis Date  . Anxiety   . Hyperlipemia   . Hypertension   . Allergic rhinitis   . Allergy   . Arthritis     Problem List: Patient Active Problem List   Diagnosis Date Noted  . Hyponatremia 11/02/2015    Past Surgical History: History reviewed. No pertinent past surgical history.  Allergies: Allergies  Allergen Reactions  . Biaxin [Clarithromycin]     Reaction: stomach cramps  . Erythromycin     Reaction:  stomach cramps    Home Medications: Prescriptions prior to admission  Medication Sig Dispense Refill Last Dose  . amoxicillin (AMOXIL) 500 MG capsule Take 1,000 mg by mouth 2 (two) times daily. For 14 days  0 11/02/2015 at Unknown time  . citalopram (CELEXA) 20 MG tablet Take 20 mg by mouth daily.   11/02/2015 at Unknown time  . fluticasone (FLONASE) 50 MCG/ACT nasal spray Place 1-2 sprays into both nostrils daily at 8 pm.  5 11/01/2015 at Unknown time  . LORazepam (ATIVAN) 0.5 MG tablet Take 0.5 mg by mouth daily.  5 11/01/2015 at Unknown time  . losartan-hydrochlorothiazide (HYZAAR) 50-12.5 MG tablet Take 1 tablet by mouth daily.  1 11/01/2015 at Unknown time  . meloxicam (MOBIC) 7.5 MG tablet Take 7.5 mg by mouth daily at 8 pm.  5 11/02/2015 at Unknown time  . metroNIDAZOLE (FLAGYL) 500 MG tablet Take 500 mg by mouth 2 (two) times daily. For 14 days  0 11/02/2015 at Unknown time  . montelukast (SINGULAIR) 10 MG tablet Take 10 mg by mouth daily.  3 11/01/2015 at Unknown time  . pantoprazole (PROTONIX) 40 MG tablet Take 40 mg by mouth 2 (two) times daily.  3 11/02/2015 at Unknown time  . simvastatin (ZOCOR) 20 MG tablet Take 20 mg by mouth daily at 6 PM.  3 11/01/2015 at Unknown time  . traMADol (ULTRAM) 50 MG tablet Take 50 mg by mouth 2 (two) times daily as needed.  0  11/02/2015 at Unknown time   Home medication reconciliation was completed with the patient.   Scheduled Inpatient Medications:   . citalopram  20 mg Oral Daily  . feeding supplement  1 Container Oral TID WC  . fluticasone  1-2 spray Each Nare Q2000  . LORazepam  0.5 mg Oral QHS  . losartan  50 mg Oral Daily  . montelukast  10 mg Oral Daily  . pantoprazole  40 mg Oral BID  . simvastatin  20 mg Oral QHS    Continuous Inpatient Infusions:   . 0.9 % NaCl with KCl 40 mEq / L 100 mL/hr at 11/03/15 1354    PRN Inpatient Medications:  acetaminophen **OR** acetaminophen, ondansetron **OR** ondansetron (ZOFRAN) IV,  traMADol  Family History: Denies family history of colon polyps, CRC, ulcers.   Social History:  Former smoker years ago, no alcohol or illicit drug use.   Review of Systems: Constitutional: + weight loss   Eyes: No changes in vision. ENT: No oral lesions, sore throat. + dry mouth and altered taste GI: see HPI.  Heme/Lymph: No easy bruising.  CV: No chest pain.  GU: No hematuria.  Integumentary: No rashes.  Neuro: No headaches.  Psych: + depression/anxiety. (controlled w/ meds) Endocrine: No heat/cold intolerance.  Allergic/Immunologic: No urticaria.  Resp: No cough, SOB.  Musculoskeletal: No joint swelling.    Physical Examination: BP 156/60 mmHg  Pulse 69  Temp(Src) 98.5 F (36.9 C) (Oral)  Resp 20  Ht 5\' 3"  (1.6 m)  Wt 122 lb 4.8 oz (55.475 kg)  BMI 21.67 kg/m2  SpO2 100% Gen: NAD, alert and oriented x 4 HEENT: PEERLA, EOMI, Neck: supple, no JVD or thyromegaly Chest: CTA bilaterally, no wheezes, crackles, or other adventitious sounds CV: RRR, no m/g/c/r Abd: soft, NT, ND, +BS in all four quadrants; no HSM, guarding, ridigity, or rebound tenderness Ext: no edema, well perfused with 2+ pulses, Skin: no rash or lesions noted Lymph: no LAD  Data: Lab Results  Component Value Date   WBC 4.8 11/02/2015   HGB 12.4 11/02/2015   HCT 36.6 11/02/2015   MCV 92.6 11/02/2015   PLT 335 11/02/2015    Recent Labs Lab 11/02/15 1456  HGB 12.4   Lab Results  Component Value Date   NA 128* 11/03/2015   K 3.5 11/03/2015   CL 94* 11/03/2015   CO2 26 11/03/2015   BUN 6 11/03/2015   CREATININE 0.70 11/03/2015   Lab Results  Component Value Date   ALT 26 11/02/2015   AST 29 11/02/2015   ALKPHOS 43 11/02/2015   BILITOT 0.5 11/02/2015   No results for input(s): APTT, INR, PTT in the last 168 hours.   Normal RUQ [place] on 10/26/15.  10/2015: + H. Pylori Ab, normal lipase, CMP normal (except Na 133)   Assessment/Plan: Brittney Ramsey is a 69 y.o. female with acute N/V  diarrhea while on treatment for HP.   1. Epigastric pain - x 4 months, work up with normal RUQ [place]. On mobic and ASA. Consider PUD, gastritis. She is on PPI BID. Needs EGD for evaluation and to rule out malignancy particularly given her unintentional weight loss. H. Pylori serologies may have been positive due to prior exposure-will bx for H. Pylori tomorrow on EGD and can treat if active infection.   2. Dysphagia - solids, pills. EGD with possible dilation.   3. Weight loss - unintentional #15lbs. EGD to exclude UGI abnormalities. Check TSH. If EGD negative consider repeat colonoscopy as outpatient  or imaging.   4. N/V/D - acutely exacerbated by ABX (flagyl and amoxicillin), improved since stopped ABX. Continue anti-emetics PRN. Electrolytes improving.   I reviewed the risks (including bleeding, perforation, infection, anesthesia complications, cardiac/respiratory complications), benefits and alternatives of EGD. Patient consents to proceed. No CP/SOB within past 6 months.    Recommendations: 1. EGD 2. Clear liquids today, NPO at midnight  3. PPI BID 4. Check TSH.   Case discussed w/ Dr. Candace Cruise. Thank you for the consult. Please call with questions or concerns.  Ronney Asters, PA-C [place]

## 2015-11-04 ENCOUNTER — Inpatient Hospital Stay: Payer: PPO | Admitting: Anesthesiology

## 2015-11-04 ENCOUNTER — Encounter
Admission: EM | Disposition: A | Discharge status: Home / Self Care | Payer: Self-pay | Source: Home / Self Care | Attending: Specialist

## 2015-11-04 ENCOUNTER — Encounter: Payer: Self-pay | Admitting: *Deleted

## 2015-11-04 HISTORY — PX: ESOPHAGOGASTRODUODENOSCOPY (EGD) WITH PROPOFOL: SHX5813

## 2015-11-04 LAB — BASIC METABOLIC PANEL
Anion gap: 4 — ABNORMAL LOW (ref 5–15)
BUN: 8 mg/dL (ref 6–20)
CHLORIDE: 103 mmol/L (ref 101–111)
CO2: 25 mmol/L (ref 22–32)
CREATININE: 0.65 mg/dL (ref 0.44–1.00)
Calcium: 8.6 mg/dL — ABNORMAL LOW (ref 8.9–10.3)
GFR calc Af Amer: >60 mL/min (ref >60)
Glucose, Bld: 92 mg/dL (ref 65–99)
Potassium: 4.6 mmol/L (ref 3.5–5.1)
SODIUM: 132 mmol/L — AB (ref 135–145)

## 2015-11-04 SURGERY — ESOPHAGOGASTRODUODENOSCOPY (EGD) WITH PROPOFOL
Anesthesia: General

## 2015-11-04 MED ORDER — PROPOFOL 500 MG/50ML IV EMUL
INTRAVENOUS | Status: DC | PRN
  Administered 2015-11-04: 125 ug/kg/min via INTRAVENOUS

## 2015-11-04 MED ORDER — PROPOFOL 10 MG/ML IV BOLUS
INTRAVENOUS | Status: DC | PRN
  Administered 2015-11-04: 40 mg via INTRAVENOUS

## 2015-11-04 MED ORDER — SODIUM CHLORIDE 0.9 % IV SOLN
INTRAVENOUS | Status: DC
  Administered 2015-11-04: 1000 mL via INTRAVENOUS

## 2015-11-04 MED ORDER — PANTOPRAZOLE SODIUM 40 MG PO TBEC: 40.0000 mg | DELAYED_RELEASE_TABLET | Freq: Every day | ORAL | Status: DC

## 2015-11-04 NOTE — Transfer of Care (Signed)
Immediate Anesthesia Transfer of Care Note  Patient: Brittney Ramsey  Procedure(s) Performed: Procedure(s): ESOPHAGOGASTRODUODENOSCOPY (EGD) WITH PROPOFOL (N/A)  Patient Location: PACU  Anesthesia Type:General  Level of Consciousness: awake, alert  and oriented  Airway & Oxygen Therapy: Patient Spontanous Breathing and Patient connected to nasal cannula oxygen  Post-op Assessment: Report given to RN and Post -op Vital signs reviewed and stable  Post vital signs: Reviewed and stable  Last Vitals:  Filed Vitals:   11/04/15 0517 11/04/15 1123  BP: 137/57 171/63  Pulse: 69 75  Temp: 36.7 C 37.4 C  Resp: 18 20    Complications: No apparent anesthesia complications

## 2015-11-04 NOTE — Plan of Care (Signed)
Problem: Education: Goal: Knowledge of [facility] Education information/materials will improve Outcome: Progressing Pt is alert and oriented. C/o pain in bilateral hands. Tramadol given with improvement. Pt is NPO after midnight for procedure.  Problem: Safety: Goal: Ability to remain free from injury will improve Outcome: Progressing Moderate fall risk, steady gait.  Standby assist, calls for assistance when needed.

## 2015-11-04 NOTE — Discharge Summary (Signed)
[facility] NAME: Catessa Knox    MR#:  [id number]  DATE OF BIRTH:  11-17-1945  DATE OF ADMISSION:  11/02/2015 ADMITTING PHYSICIAN: Fritzi Mandes, MD  DATE OF DISCHARGE: 11/04/2015  3:33 PM  PRIMARY CARE PHYSICIAN: BABAOFF, MARC E, MD    ADMISSION DIAGNOSIS:  Hyponatremia [E87.1] Generalized abdominal pain [R10.84] Vomiting and diarrhea [R11.10, R19.7]  DISCHARGE DIAGNOSIS:  Active Problems:   Hyponatremia   SECONDARY DIAGNOSIS:   Past Medical History  Diagnosis Date  . Anxiety   . Hyperlipemia   . Hypertension   . Allergic rhinitis   . Allergy   . Arthritis     HOSPITAL COURSE:   69 year old female with past medical history of hypertension, anxiety, hyperlipidemia, history of allergic rhinitis who presented to the hospital with nausea vomiting and noted to be hypokalemic and hyponatremic.  #1 hyponatremia-this was secondary to volume loss from nausea and vomiting. -Patient was given IV fluids and her sodium has normalized now.  #2 hypokalemia-also due to the intractable nausea vomiting. -It was supplemented and has resolved now.   #3 intractable nausea and vomiting-due to the antibiotics she was taking to treat her H. pylori infection -A gastroenterology consult was obtained. Patient was seen by Dr. Candace Cruise. She underwent a upper GI endoscopy which did not show any evidence of acute ulcers. She had H pylori biopsies done. -As per gastroenterology she is going to be discharged on PPI daily and hold off on taking her amoxicillin and Flagyl until the biopsy results come back. -She will follow-up with gastroenterology next 1-2 weeks. - she was tolerating a soft diet prior to discharge.   #4 hyperlipidemia- she will continue simvastatin.  #5 depression- she will continue Celexa.  #6 anxiety- she will continue Ativan.  #7 hypertension- she will continue losartan.  DISCHARGE CONDITIONS:   Stable  CONSULTS  OBTAINED:  Treatment Team:  Hulen Luster, MD  DRUG ALLERGIES:   Allergies  Allergen Reactions  . Biaxin [Clarithromycin]     Reaction: stomach cramps  . Erythromycin     Reaction: stomach cramps    DISCHARGE MEDICATIONS:   Discharge Medication List as of 11/04/2015  3:30 PM    CONTINUE these medications which have CHANGED   Details  pantoprazole (PROTONIX) 40 MG tablet Take 1 tablet (40 mg total) by mouth daily., Starting 11/04/2015, Until Discontinued, No Print      CONTINUE these medications which have NOT CHANGED   Details  citalopram (CELEXA) 20 MG tablet Take 20 mg by mouth daily., Until Discontinued, Historical Med    fluticasone (FLONASE) 50 MCG/ACT nasal spray Place 1-2 sprays into both nostrils daily at 8 pm., Until Discontinued, Historical Med    LORazepam (ATIVAN) 0.5 MG tablet Take 0.5 mg by mouth daily., Until Discontinued, Historical Med    losartan-hydrochlorothiazide (HYZAAR) 50-12.5 MG tablet Take 1 tablet by mouth daily., Until Discontinued, Historical Med    meloxicam (MOBIC) 7.5 MG tablet Take 7.5 mg by mouth daily at 8 pm., Until Discontinued, Historical Med    montelukast (SINGULAIR) 10 MG tablet Take 10 mg by mouth daily., Until Discontinued, Historical Med    simvastatin (ZOCOR) 20 MG tablet Take 20 mg by mouth daily at 6 PM., Until Discontinued, Historical Med    traMADol (ULTRAM) 50 MG tablet Take 50 mg by mouth 2 (two) times daily as needed., Until Discontinued, Historical Med      STOP taking these medications  amoxicillin (AMOXIL) 500 MG capsule      metroNIDAZOLE (FLAGYL) 500 MG tablet          DISCHARGE INSTRUCTIONS:   DIET:  Cardiac diet  DISCHARGE CONDITION:  Stable  ACTIVITY:  Activity as tolerated  OXYGEN:  Home Oxygen: No.   Oxygen Delivery: room air  DISCHARGE LOCATION:  home   If you experience worsening of your admission symptoms, develop shortness of breath, life threatening emergency, suicidal or homicidal  thoughts you must seek medical attention immediately by calling 911 or calling your MD immediately  if symptoms less severe.  You Must read complete instructions/literature along with all the possible adverse reactions/side effects for all the Medicines you take and that have been prescribed to you. Take any new Medicines after you have completely understood and accpet all the possible adverse reactions/side effects.   Please note  You were cared for by a hospitalist during your hospital stay. If you have any questions about your discharge medications or the care you received while you were in the hospital after you are discharged, you can call the unit and asked to speak with the hospitalist on call if the hospitalist that took care of you is not available. Once you are discharged, your primary care physician will handle any further medical issues. Please note that NO REFILLS for any discharge medications will be authorized once you are discharged, as it is imperative that you return to your primary care physician (or establish a relationship with a primary care physician if you do not have one) for your aftercare needs so that they can reassess your need for medications and monitor your lab values.     Today   No abdominal pain, nausea, vomiting overnight. Tolerated a soft diet well. Going for endoscopy this morning.  VITAL SIGNS:  Blood pressure 143/56, pulse 70, temperature 98.1 F (36.7 C), temperature source Tympanic, resp. rate 17, height 5\' 3"  (1.6 m), weight 55.475 kg (122 lb 4.8 oz), SpO2 100 %.  I/O:   Intake/Output Summary (Last 24 hours) at 11/04/15 1621 Last data filed at 11/03/15 1756  Gross per 24 hour  Intake 643.33 ml  Output      0 ml  Net 643.33 ml    PHYSICAL EXAMINATION:  GENERAL:  69 y.o.-year-old patient lying in the bed in no acute distress.  EYES: Pupils equal, round, reactive to light and accommodation. No scleral icterus. Extraocular muscles intact.  HEENT:  Head atraumatic, normocephalic. Oropharynx and nasopharynx clear.  NECK:  Supple, no jugular venous distention. No thyroid enlargement, no tenderness.  LUNGS: Normal breath sounds bilaterally, no wheezing, rales,rhonchi. No use of accessory muscles of respiration.  CARDIOVASCULAR: S1, S2 normal. No murmurs, rubs, or gallops.  ABDOMEN: Soft, non-tender, non-distended. Bowel sounds present. No organomegaly or mass.  EXTREMITIES: No pedal edema, cyanosis, or clubbing.  NEUROLOGIC: Cranial nerves II through XII are intact. No focal motor or sensory defecits b/l.  PSYCHIATRIC: The patient is alert and oriented x 3. Good affect.  SKIN: No obvious rash, lesion, or ulcer.   DATA REVIEW:   CBC  Recent Labs Lab 11/02/15 1456  WBC 4.8  HGB 12.4  HCT 36.6  PLT 335    Chemistries   Recent Labs Lab 11/02/15 1456  11/04/15 0608  NA 120*  < > 132*  K 3.0*  < > 4.6  CL 87*  < > 103  CO2 23  < > 25  GLUCOSE 144*  < > 92  BUN 9  < >  8  CREATININE 0.78  < > 0.65  CALCIUM 8.7*  < > 8.6*  MG 1.8  --   --   AST 29  --   --   ALT 26  --   --   ALKPHOS 43  --   --   BILITOT 0.5  --   --   < > = values in this interval not displayed.  Cardiac Enzymes  Recent Labs Lab 11/02/15 1456  TROPONINI <0.03    Microbiology Results  No results found for this or any previous visit.  RADIOLOGY:  No results found.    Management plans discussed with the patient, family and they are in agreement.  CODE STATUS:     Code Status Orders        Start     Ordered   11/02/15 2045  Full code   Continuous     11/02/15 2044      TOTAL TIME TAKING CARE OF THIS PATIENT: 40 minutes.    Henreitta Leber M.D on 11/04/2015 at 4:21 PM  Between 7am to 6pm - Pager - [phone]  After 6pm go to [email or web address] - password [email or web address] [facility]  Office  [phone]  CC: Primary care physician; BABAOFF, Caryl Bis, MD

## 2015-11-04 NOTE — Progress Notes (Signed)
Pt discharged, discharge instructions reviewed with patient and family, they verified understanding. IV removed, pt rolled out in wheelchair by staff.

## 2015-11-04 NOTE — Anesthesia Preprocedure Evaluation (Signed)
Anesthesia Evaluation  Patient identified by MRN, date of birth, ID band Patient awake    Reviewed: Allergy & Precautions, NPO status , Patient's Chart, lab work & pertinent test results, reviewed documented beta blocker date and time   Airway Mallampati: II  TM Distance: >3 FB     Dental  (+) Chipped   Pulmonary former smoker,           Cardiovascular hypertension,      Neuro/Psych Anxiety    GI/Hepatic   Endo/Other    Renal/GU      Musculoskeletal  (+) Arthritis ,   Abdominal   Peds  Hematology   Anesthesia Other Findings   Reproductive/Obstetrics                             Anesthesia Physical Anesthesia Plan  ASA: II  Anesthesia Plan: General   Post-op Pain Management:    Induction: Intravenous  Airway Management Planned: Nasal Cannula  Additional Equipment:   Intra-op Plan:   Post-operative Plan:   Informed Consent: I have reviewed the patients History and Physical, chart, labs and discussed the procedure including the risks, benefits and alternatives for the proposed anesthesia with the patient or authorized representative who has indicated his/her understanding and acceptance.     Plan Discussed with: CRNA  Anesthesia Plan Comments:         Anesthesia Quick Evaluation

## 2015-11-04 NOTE — Anesthesia Postprocedure Evaluation (Signed)
Anesthesia Post Note  Patient: Brittney Ramsey  Procedure(s) Performed: Procedure(s) (LRB): ESOPHAGOGASTRODUODENOSCOPY (EGD) WITH PROPOFOL (N/A)  Patient location during evaluation: Endoscopy Anesthesia Type: General Pain management: pain level controlled Vital Signs Assessment: post-procedure vital signs reviewed and stable Respiratory status: spontaneous breathing Cardiovascular status: blood pressure returned to baseline Anesthetic complications: no    Last Vitals:  Filed Vitals:   11/04/15 1123 11/04/15 1226  BP: 171/63 143/56  Pulse: 75 70  Temp: 37.4 C 36.7 C  Resp: 20 17    Last Pain:  Filed Vitals:   11/04/15 1252  PainSc: 0-No pain                 Santanna Olenik S

## 2015-11-04 NOTE — Care Management Important Message (Signed)
Important Message  Patient Details  Name: Brittney Ramsey MRN: [id number] Date of Birth: 1946-09-26   Medicare Important Message Given:  Yes    Juliann Pulse A Dominyk Law 11/04/2015, 9:28 AM

## 2015-11-04 NOTE — Consult Note (Signed)
  GI Inpatient Follow-up Note  Patient Identification: Brittney Ramsey is a 69 y.o. female with epigastric pain, nausea, and vomiting. Please see Rushie Chestnut' notes from yesterday.  Subjective: Feels much better since admission. H.pylori regimen made her sicker. Some "dark" stools last night. Scheduled Inpatient Medications:  . citalopram  20 mg Oral Daily  . feeding supplement  1 Container Oral TID WC  . fluticasone  1-2 spray Each Nare Q2000  . LORazepam  0.5 mg Oral QHS  . losartan  50 mg Oral Daily  . montelukast  10 mg Oral Daily  . pantoprazole  40 mg Oral BID  . simvastatin  20 mg Oral QHS    Continuous Inpatient Infusions:   . 0.9 % NaCl with KCl 40 mEq / L 100 mL/hr (11/04/15 0239)    PRN Inpatient Medications:  acetaminophen **OR** acetaminophen, ondansetron **OR** ondansetron (ZOFRAN) IV, traMADol  Review of Systems: Constitutional: Weight is stable.  Eyes: No changes in vision. ENT: No oral lesions, sore throat.  GI: see HPI.  Heme/Lymph: No easy bruising.  CV: No chest pain.  GU: No hematuria.  Integumentary: No rashes.  Neuro: No headaches.  Psych: No depression/anxiety.  Endocrine: No heat/cold intolerance.  Allergic/Immunologic: No urticaria.  Resp: No cough, SOB.  Musculoskeletal: No joint swelling.    Physical Examination: BP 137/57 mmHg  Pulse 69  Temp(Src) 98.1 F (36.7 C) (Oral)  Resp 18  Ht 5\' 3"  (1.6 m)  Wt 55.475 kg (122 lb 4.8 oz)  BMI 21.67 kg/m2  SpO2 99% Gen: NAD, alert and oriented x 4 HEENT: PEERLA, EOMI, Neck: supple, no JVD or thyromegaly Chest: CTA bilaterally, no wheezes, crackles, or other adventitious sounds CV: RRR, no m/g/c/r Abd: soft, mild epigastric tenderness, ND, +BS in all four quadrants; no HSM, guarding, ridigity, or rebound tenderness Ext: no edema, well perfused with 2+ pulses, Skin: no rash or lesions noted Lymph: no LAD  Data: Lab Results  Component Value Date   WBC 4.8 11/02/2015   HGB 12.4 11/02/2015    HCT 36.6 11/02/2015   MCV 92.6 11/02/2015   PLT 335 11/02/2015    Recent Labs Lab 11/02/15 1456  HGB 12.4   Lab Results  Component Value Date   NA 132* 11/04/2015   K 4.6 11/04/2015   CL 103 11/04/2015   CO2 25 11/04/2015   BUN 8 11/04/2015   CREATININE 0.65 11/04/2015   Lab Results  Component Value Date   ALT 26 11/02/2015   AST 29 11/02/2015   ALKPHOS 43 11/02/2015   BILITOT 0.5 11/02/2015   No results for input(s): APTT, INR, PTT in the last 168 hours. Assessment/Plan: Ms. Adi is a 69 y.o. female with likely PUD.  Recommendations: Continue PPI BID. Plan for EGD later today. Bx for H.pylori. Thanks. Please call with questions or concerns.  Kameko Hukill, Lupita Dawn, MD

## 2015-11-04 NOTE — Op Note (Signed)
EGD looks normal. Bx taken for H.pylori. Do not resume H.pylori treatment until H. Pylori bx come back. Reg diet ordered. Discharge pt home on daily PPI. Have pt f/u in GI in 1-2 weeks.

## 2015-11-04 NOTE — Op Note (Signed)
[facility] Patient Name: Brittney Ramsey Procedure Date: 11/04/2015 12:12 PM MRN: [id number] Account #: [id number] Date of Birth: 04-Jul-1946 Admit Type: Inpatient Age: 69 Room: [facility] ENDO ROOM 4 Gender: Female Note Status: Finalized Procedure:         Upper GI endoscopy Indications:       Epigastric abdominal pain, Nausea Providers:         Lupita Dawn. Candace Cruise, MD Referring MD:      Caprice Renshaw (Referring MD) Medicines:         Monitored Anesthesia Care Complications:     No immediate complications. Procedure:         Pre-Anesthesia Assessment:                    - Prior to the procedure, a History and Physical was                     performed, and patient medications, allergies and                     sensitivities were reviewed. The patient's tolerance of                     previous anesthesia was reviewed.                    - The risks and benefits of the procedure and the sedation                     options and risks were discussed with the patient. All                     questions were answered and informed consent was obtained.                    - After reviewing the risks and benefits, the patient was                     deemed in satisfactory condition to undergo the procedure.                    After obtaining informed consent, the endoscope was passed                     under direct vision. Throughout the procedure, the                     patient's blood pressure, pulse, and oxygen saturations                     were monitored continuously. The Endoscope was introduced                     through the mouth, and advanced to the second part of                     duodenum. The upper GI endoscopy was accomplished without                     difficulty. The patient tolerated the procedure well. Findings:      The examined esophagus was normal.      The entire examined stomach was normal. Biopsies were taken with a cold       forceps  for Helicobacter pylori testing.  The examined duodenum was normal. Impression:        - Normal esophagus.                    - Normal stomach. Biopsied.                    - Normal examined duodenum. Recommendation:    - Observe patient's clinical course.                    - Continue present medications.                    - Await pathology results.                    - The findings and recommendations were discussed with the                     patient. Procedure Code(s): --- Professional ---                    [phone], Esophagogastroduodenoscopy, flexible, transoral;                     with biopsy, single or multiple Diagnosis Code(s): --- Professional ---                    R10.13, Epigastric pain                    R11.0, Nausea CPT copyright 2014 American Medical Association. All rights reserved. The codes documented in this report are preliminary and upon coder review may  be revised to meet current compliance requirements. Hulen Luster, MD 11/04/2015 12:22:36 PM This report has been signed electronically. Number of Addenda: 0 Note Initiated On: 11/04/2015 12:12 PM      [facility]

## 2015-11-08 ENCOUNTER — Encounter: Payer: Self-pay | Admitting: Gastroenterology

## 2015-11-08 LAB — SURGICAL PATHOLOGY

## 2015-11-15 DIAGNOSIS — R1310 Dysphagia, unspecified: Secondary | ICD-10-CM | POA: Diagnosis not present

## 2015-11-15 DIAGNOSIS — R112 Nausea with vomiting, unspecified: Secondary | ICD-10-CM | POA: Diagnosis not present

## 2015-11-15 DIAGNOSIS — K2950 Unspecified chronic gastritis without bleeding: Secondary | ICD-10-CM | POA: Diagnosis not present

## 2015-11-15 DIAGNOSIS — A048 Other specified bacterial intestinal infections: Secondary | ICD-10-CM | POA: Diagnosis not present

## 2015-11-15 DIAGNOSIS — R197 Diarrhea, unspecified: Secondary | ICD-10-CM | POA: Diagnosis not present

## 2015-11-15 DIAGNOSIS — K5900 Constipation, unspecified: Secondary | ICD-10-CM | POA: Diagnosis not present

## 2015-12-13 DIAGNOSIS — R1310 Dysphagia, unspecified: Secondary | ICD-10-CM | POA: Diagnosis not present

## 2015-12-13 DIAGNOSIS — A048 Other specified bacterial intestinal infections: Secondary | ICD-10-CM | POA: Diagnosis not present

## 2015-12-13 DIAGNOSIS — K219 Gastro-esophageal reflux disease without esophagitis: Secondary | ICD-10-CM | POA: Diagnosis not present

## 2015-12-13 DIAGNOSIS — K5900 Constipation, unspecified: Secondary | ICD-10-CM | POA: Diagnosis not present

## 2015-12-21 ENCOUNTER — Ambulatory Visit
Admission: RE | Admit: 2015-12-21 | Discharge: 2015-12-21 | Disposition: A | Discharge status: Home / Self Care | Payer: PPO | Source: Ambulatory Visit | Attending: Gastroenterology | Admitting: Gastroenterology

## 2015-12-21 ENCOUNTER — Encounter
Admission: RE | Disposition: A | Discharge status: Home / Self Care | Payer: Self-pay | Source: Ambulatory Visit | Attending: Gastroenterology

## 2015-12-21 DIAGNOSIS — R1310 Dysphagia, unspecified: Secondary | ICD-10-CM | POA: Diagnosis not present

## 2015-12-21 DIAGNOSIS — K222 Esophageal obstruction: Secondary | ICD-10-CM | POA: Insufficient documentation

## 2015-12-21 DIAGNOSIS — Z791 Long term (current) use of non-steroidal anti-inflammatories (NSAID): Secondary | ICD-10-CM | POA: Insufficient documentation

## 2015-12-21 DIAGNOSIS — Z7951 Long term (current) use of inhaled steroids: Secondary | ICD-10-CM | POA: Insufficient documentation

## 2015-12-21 DIAGNOSIS — Z79899 Other long term (current) drug therapy: Secondary | ICD-10-CM | POA: Insufficient documentation

## 2015-12-21 HISTORY — PX: ESOPHAGEAL MANOMETRY: SHX5429

## 2015-12-21 SURGERY — MANOMETRY, ESOPHAGUS

## 2015-12-21 MED ORDER — BUTAMBEN-TETRACAINE-BENZOCAINE 2-2-14 % EX AERO
1.0000 | INHALATION_SPRAY | Freq: Once | CUTANEOUS | Status: AC
  Administered 2015-12-21: 1 via TOPICAL
  Filled 2015-12-21: qty 20

## 2015-12-21 MED ORDER — LIDOCAINE HCL 2 % EX GEL
1.0000 "application " | Freq: Once | CUTANEOUS | Status: AC
  Administered 2015-12-21: 3
  Filled 2015-12-21: qty 5

## 2015-12-21 SURGICAL SUPPLY — 2 items
FACESHIELD LNG OPTICON STERILE (SAFETY) IMPLANT
GLOVE BIO SURGEON STRL SZ8 (GLOVE) ×4 IMPLANT

## 2015-12-22 ENCOUNTER — Encounter: Payer: Self-pay | Admitting: Gastroenterology

## 2015-12-29 ENCOUNTER — Other Ambulatory Visit: Payer: Self-pay | Admitting: Gastroenterology

## 2015-12-29 DIAGNOSIS — R933 Abnormal findings on diagnostic imaging of other parts of digestive tract: Secondary | ICD-10-CM

## 2016-01-03 ENCOUNTER — Ambulatory Visit: Admission: RE | Admit: 2016-01-03 | Payer: PPO | Source: Ambulatory Visit

## 2016-01-25 DIAGNOSIS — R5381 Other malaise: Secondary | ICD-10-CM | POA: Diagnosis not present

## 2016-01-25 DIAGNOSIS — M25511 Pain in right shoulder: Secondary | ICD-10-CM | POA: Diagnosis not present

## 2016-01-25 DIAGNOSIS — M25512 Pain in left shoulder: Secondary | ICD-10-CM | POA: Diagnosis not present

## 2016-01-25 DIAGNOSIS — M25551 Pain in right hip: Secondary | ICD-10-CM | POA: Insufficient documentation

## 2016-01-25 DIAGNOSIS — I10 Essential (primary) hypertension: Secondary | ICD-10-CM | POA: Diagnosis not present

## 2016-01-25 DIAGNOSIS — R5383 Other fatigue: Secondary | ICD-10-CM | POA: Diagnosis not present

## 2016-01-25 DIAGNOSIS — G8929 Other chronic pain: Secondary | ICD-10-CM | POA: Insufficient documentation

## 2016-01-25 DIAGNOSIS — M25552 Pain in left hip: Secondary | ICD-10-CM | POA: Diagnosis not present

## 2016-01-25 DIAGNOSIS — M1990 Unspecified osteoarthritis, unspecified site: Secondary | ICD-10-CM | POA: Insufficient documentation

## 2016-01-25 DIAGNOSIS — M810 Age-related osteoporosis without current pathological fracture: Secondary | ICD-10-CM | POA: Diagnosis not present

## 2016-01-25 DIAGNOSIS — M150 Primary generalized (osteo)arthritis: Secondary | ICD-10-CM | POA: Diagnosis not present

## 2016-01-27 DIAGNOSIS — A048 Other specified bacterial intestinal infections: Secondary | ICD-10-CM | POA: Diagnosis not present

## 2016-02-06 ENCOUNTER — Ambulatory Visit
Admission: RE | Admit: 2016-02-06 | Discharge: 2016-02-06 | Disposition: A | Discharge status: Home / Self Care | Payer: PPO | Source: Ambulatory Visit | Attending: Gastroenterology | Admitting: Gastroenterology

## 2016-02-06 DIAGNOSIS — K449 Diaphragmatic hernia without obstruction or gangrene: Secondary | ICD-10-CM | POA: Insufficient documentation

## 2016-02-06 DIAGNOSIS — I700 Atherosclerosis of aorta: Secondary | ICD-10-CM | POA: Diagnosis not present

## 2016-02-06 DIAGNOSIS — R933 Abnormal findings on diagnostic imaging of other parts of digestive tract: Secondary | ICD-10-CM

## 2016-02-06 LAB — POCT I-STAT CREATININE: Creatinine, Ser: 1 mg/dL (ref 0.44–1.00)

## 2016-02-06 MED ORDER — IOPAMIDOL (ISOVUE-300) INJECTION 61%
75.0000 mL | Freq: Once | INTRAVENOUS | Status: AC | PRN
  Administered 2016-02-06: 75 mL via INTRAVENOUS

## 2016-02-08 DIAGNOSIS — M25512 Pain in left shoulder: Secondary | ICD-10-CM | POA: Diagnosis not present

## 2016-02-08 DIAGNOSIS — M25551 Pain in right hip: Secondary | ICD-10-CM | POA: Diagnosis not present

## 2016-02-08 DIAGNOSIS — M150 Primary generalized (osteo)arthritis: Secondary | ICD-10-CM | POA: Diagnosis not present

## 2016-02-08 DIAGNOSIS — M25511 Pain in right shoulder: Secondary | ICD-10-CM | POA: Diagnosis not present

## 2016-02-08 DIAGNOSIS — M25552 Pain in left hip: Secondary | ICD-10-CM | POA: Diagnosis not present

## 2016-02-08 DIAGNOSIS — G8929 Other chronic pain: Secondary | ICD-10-CM | POA: Diagnosis not present

## 2016-02-15 DIAGNOSIS — J019 Acute sinusitis, unspecified: Secondary | ICD-10-CM | POA: Diagnosis not present

## 2016-02-15 DIAGNOSIS — B9689 Other specified bacterial agents as the cause of diseases classified elsewhere: Secondary | ICD-10-CM | POA: Diagnosis not present

## 2016-02-20 DIAGNOSIS — B0230 Zoster ocular disease, unspecified: Secondary | ICD-10-CM | POA: Diagnosis not present

## 2016-03-05 DIAGNOSIS — B0230 Zoster ocular disease, unspecified: Secondary | ICD-10-CM | POA: Diagnosis not present

## 2016-04-12 DIAGNOSIS — Z79899 Other long term (current) drug therapy: Secondary | ICD-10-CM | POA: Diagnosis not present

## 2016-04-12 DIAGNOSIS — E7800 Pure hypercholesterolemia, unspecified: Secondary | ICD-10-CM | POA: Diagnosis not present

## 2016-04-19 DIAGNOSIS — E7800 Pure hypercholesterolemia, unspecified: Secondary | ICD-10-CM | POA: Diagnosis not present

## 2016-04-19 DIAGNOSIS — G894 Chronic pain syndrome: Secondary | ICD-10-CM | POA: Diagnosis not present

## 2016-04-19 DIAGNOSIS — I10 Essential (primary) hypertension: Secondary | ICD-10-CM | POA: Diagnosis not present

## 2016-04-19 DIAGNOSIS — F419 Anxiety disorder, unspecified: Secondary | ICD-10-CM | POA: Diagnosis not present

## 2016-04-19 DIAGNOSIS — Z79899 Other long term (current) drug therapy: Secondary | ICD-10-CM | POA: Diagnosis not present

## 2016-06-08 DIAGNOSIS — K219 Gastro-esophageal reflux disease without esophagitis: Secondary | ICD-10-CM | POA: Diagnosis not present

## 2016-06-08 DIAGNOSIS — R1319 Other dysphagia: Secondary | ICD-10-CM | POA: Diagnosis not present

## 2016-06-08 DIAGNOSIS — K5900 Constipation, unspecified: Secondary | ICD-10-CM | POA: Diagnosis not present

## 2016-07-03 DIAGNOSIS — G894 Chronic pain syndrome: Secondary | ICD-10-CM | POA: Diagnosis not present

## 2016-07-10 DIAGNOSIS — G8929 Other chronic pain: Secondary | ICD-10-CM | POA: Insufficient documentation

## 2016-07-10 DIAGNOSIS — M545 Low back pain: Secondary | ICD-10-CM | POA: Diagnosis not present

## 2016-07-10 DIAGNOSIS — I10 Essential (primary) hypertension: Secondary | ICD-10-CM | POA: Diagnosis not present

## 2016-07-10 DIAGNOSIS — F411 Generalized anxiety disorder: Secondary | ICD-10-CM | POA: Diagnosis not present

## 2016-07-10 DIAGNOSIS — M150 Primary generalized (osteo)arthritis: Secondary | ICD-10-CM | POA: Diagnosis not present

## 2016-08-15 DIAGNOSIS — M7062 Trochanteric bursitis, left hip: Secondary | ICD-10-CM | POA: Diagnosis not present

## 2016-08-15 DIAGNOSIS — M1612 Unilateral primary osteoarthritis, left hip: Secondary | ICD-10-CM | POA: Diagnosis not present

## 2016-08-28 ENCOUNTER — Emergency Department
Admission: EM | Admit: 2016-08-28 | Discharge: 2016-08-29 | Disposition: A | Discharge status: Home / Self Care | Payer: PPO | Attending: Emergency Medicine | Admitting: Emergency Medicine

## 2016-08-28 ENCOUNTER — Encounter: Payer: Self-pay | Admitting: Emergency Medicine

## 2016-08-28 DIAGNOSIS — I10 Essential (primary) hypertension: Secondary | ICD-10-CM | POA: Insufficient documentation

## 2016-08-28 DIAGNOSIS — Z23 Encounter for immunization: Secondary | ICD-10-CM | POA: Diagnosis not present

## 2016-08-28 DIAGNOSIS — Z87891 Personal history of nicotine dependence: Secondary | ICD-10-CM | POA: Diagnosis not present

## 2016-08-28 DIAGNOSIS — Y999 Unspecified external cause status: Secondary | ICD-10-CM | POA: Diagnosis not present

## 2016-08-28 DIAGNOSIS — Y929 Unspecified place or not applicable: Secondary | ICD-10-CM | POA: Diagnosis not present

## 2016-08-28 DIAGNOSIS — Y9389 Activity, other specified: Secondary | ICD-10-CM | POA: Insufficient documentation

## 2016-08-28 DIAGNOSIS — S61211A Laceration without foreign body of left index finger without damage to nail, initial encounter: Secondary | ICD-10-CM

## 2016-08-28 DIAGNOSIS — Z79899 Other long term (current) drug therapy: Secondary | ICD-10-CM | POA: Insufficient documentation

## 2016-08-28 DIAGNOSIS — W25XXXA Contact with sharp glass, initial encounter: Secondary | ICD-10-CM | POA: Diagnosis not present

## 2016-08-28 DIAGNOSIS — Z791 Long term (current) use of non-steroidal anti-inflammatories (NSAID): Secondary | ICD-10-CM | POA: Insufficient documentation

## 2016-08-28 MED ORDER — TETANUS-DIPHTH-ACELL PERTUSSIS 5-2.5-18.5 LF-MCG/0.5 IM SUSP
0.5000 mL | Freq: Once | INTRAMUSCULAR | Status: AC
  Administered 2016-08-29: 0.5 mL via INTRAMUSCULAR
  Filled 2016-08-28: qty 0.5

## 2016-08-28 MED ORDER — LIDOCAINE HCL (PF) 1 % IJ SOLN
10.0000 mL | Freq: Once | INTRAMUSCULAR | Status: DC
  Filled 2016-08-28: qty 10

## 2016-08-28 NOTE — ED Provider Notes (Signed)
[facility] Emergency Department Provider Note  ____________________________________________  Time seen: Approximately 11:27 PM  I have reviewed the triage vital signs and the nursing notes.   HISTORY  Chief Complaint Extremity Laceration    HPI Brittney Ramsey is a 70 y.o. female who presents emergency department complaining of a laceration to the base of the second digit left hand. Patient states that she was going outside when she tripped over her steps falling and cutting her hand. Patient states that she was carrying a glass vase when she fell and this lacerated her finger. Patient reports having full range of motion of her finger. She was out of control bleeding with direct pressure. She is unsure of her last tetanus shot. No other injury or complaint. No medications prior to arrival.   Past Medical History:  Diagnosis Date  . Allergic rhinitis   . Allergy   . Anxiety   . Arthritis   . Hyperlipemia   . Hypertension     Patient Active Problem List   Diagnosis Date Noted  . Hyponatremia 11/02/2015    Past Surgical History:  Procedure Laterality Date  . ESOPHAGEAL MANOMETRY N/A 12/21/2015   Procedure: ESOPHAGEAL MANOMETRY (EM);  Surgeon: Josefine Class, MD;  Location: [facility] ENDOSCOPY;  Service: Endoscopy;  Laterality: N/A;  . ESOPHAGOGASTRODUODENOSCOPY (EGD) WITH PROPOFOL N/A 11/04/2015   Procedure: ESOPHAGOGASTRODUODENOSCOPY (EGD) WITH PROPOFOL;  Surgeon: Hulen Luster, MD;  Location: [facility] ENDOSCOPY;  Service: Endoscopy;  Laterality: N/A;    Prior to Admission medications   Medication Sig Start Date End Date Taking? Authorizing Provider  citalopram (CELEXA) 20 MG tablet Take 20 mg by mouth daily.    Historical Provider, MD  fluticasone (FLONASE) 50 MCG/ACT nasal spray Place 1-2 sprays into both nostrils daily at 8 pm.    Historical Provider, MD  LORazepam (ATIVAN) 0.5 MG tablet Take 0.5 mg by mouth daily.    Historical Provider, MD   losartan-hydrochlorothiazide (HYZAAR) 50-12.5 MG tablet Take 1 tablet by mouth daily.    Historical Provider, MD  meloxicam (MOBIC) 7.5 MG tablet Take 7.5 mg by mouth daily at 8 pm.    Historical Provider, MD  montelukast (SINGULAIR) 10 MG tablet Take 10 mg by mouth daily.    Historical Provider, MD  pantoprazole (PROTONIX) 40 MG tablet Take 1 tablet (40 mg total) by mouth daily. 11/04/15   Henreitta Leber, MD  simvastatin (ZOCOR) 20 MG tablet Take 20 mg by mouth daily at 6 PM.    Historical Provider, MD  traMADol (ULTRAM) 50 MG tablet Take 50 mg by mouth 2 (two) times daily as needed.    Historical Provider, MD    Allergies Biaxin [clarithromycin] and Erythromycin  History reviewed. No pertinent family history.  Social History Social History  Substance Use Topics  . Smoking status: Former Smoker    Types: Cigarettes  . Smokeless tobacco: Never Used  . Alcohol use No     Review of Systems  Constitutional: No fever/chills Cardiovascular: no chest pain. Respiratory: no cough. No SOB. Musculoskeletal: Negative for musculoskeletal pain. Skin: Laceration to the second digit of the left hand Neurological: Negative for headaches, focal weakness or numbness. 10-point ROS otherwise negative.  ____________________________________________   PHYSICAL EXAM:  VITAL SIGNS: ED Triage Vitals  Enc Vitals Group     BP 08/28/16 2116 (!) 169/69     Pulse Rate 08/28/16 2116 72     Resp 08/28/16 2116 18     Temp 08/28/16 2116 98.1 F (  36.7 C)     Temp Source 08/28/16 2116 Oral     SpO2 08/28/16 2116 99 %     Weight --      Height --      Head Circumference --      Peak Flow --      Pain Score 08/28/16 2247 4     Pain Loc --      Pain Edu? --      Excl. in [place]? --      Constitutional: Alert and oriented. Well appearing and in no acute distress. Eyes: Conjunctivae are normal. PERRL. EOMI. Head: Atraumatic Neck: No stridor.    Cardiovascular: Normal rate, regular rhythm. Normal  S1 and S2.  Good peripheral circulation. Respiratory: Normal respiratory effort without tachypnea or retractions. Lungs CTAB. Good air entry to the bases with no decreased or absent breath sounds. Musculoskeletal: Full range of motion to all extremities. No gross deformities appreciated. Neurologic:  Normal speech and language. No gross focal neurologic deficits are appreciated.  Skin:  Laceration is noted to the base of the second digit left hand. This is on the palmar aspect right over the MCP joint. Full range of motion finger. Full laceration is explored with no foreign body. Bleeding is controlled. Sensation and cap refill intact distally Psychiatric: Mood and affect are normal. Speech and behavior are normal. Patient exhibits appropriate insight and judgement.   ____________________________________________   LABS (all labs ordered are listed, but only abnormal results are displayed)  Labs Reviewed - No data to display ____________________________________________  EKG   ____________________________________________  RADIOLOGY   No results found.  ____________________________________________    PROCEDURES  Procedure(s) performed:    Marland KitchenMarland KitchenLaceration Repair Date/Time: 08/29/2016 12:12 AM Performed by: Betha Loa D Authorized by: Betha Loa D   Consent:    Consent obtained:  Verbal   Consent given by:  Patient   Risks discussed:  Pain Anesthesia (see MAR for exact dosages):    Anesthesia method:  Local infiltration   Local anesthetic:  Lidocaine 1% w/o epi Laceration details:    Location:  Finger   Finger location:  L index finger   Length (cm):  2.5 Repair type:    Repair type:  Simple Exploration:    Hemostasis achieved with:  Direct pressure   Wound exploration: wound explored through full range of motion and entire depth of wound probed and visualized     Contaminated: no   Treatment:    Area cleansed with:  Betadine   Amount of cleaning:   Standard   Irrigation solution:  Sterile saline   Irrigation method:  Syringe Skin repair:    Repair method:  Sutures   Suture size:  4-0   Suture material:  Nylon   Number of sutures:  9 Approximation:    Approximation:  Close Post-procedure details:    Dressing:  Open (no dressing)   Patient tolerance of procedure:  Tolerated well, no immediate complications      Medications  lidocaine (PF) (XYLOCAINE) 1 % injection 10 mL (not administered)  Tdap (BOOSTRIX) injection 0.5 mL (0.5 mLs Intramuscular Given 08/29/16 0006)     ____________________________________________   INITIAL IMPRESSION / ASSESSMENT AND PLAN / ED COURSE  Pertinent labs & imaging results that were available during my care of the patient were reviewed by me and considered in my medical decision making (see chart for details).  Review of the [place] CSRS was performed in accordance of the [place] prior to dispensing any controlled  drugs.  Clinical Course    Patient's diagnosis is consistent with Finger laceration. This is close as described above. Patient is given wound care structures. She will follow up with primary care in 7 days for suture removal. Patient to take Tylenol or Motrin as needed for any pain. Patient is given ED precautions to return to the ED for any worsening or new symptoms.     ____________________________________________  FINAL CLINICAL IMPRESSION(S) / ED DIAGNOSES  Final diagnoses:  Laceration of left index finger without foreign body without damage to nail, initial encounter      NEW MEDICATIONS STARTED DURING THIS VISIT:  Discharge Medication List as of 08/29/2016 12:02 AM          This chart was dictated using voice recognition software/Dragon. Despite best efforts to proofread, errors can occur which can change the meaning. Any change was purely unintentional.    Darletta Moll, PA-C 08/29/16 0013    Rudene Re, MD 08/29/16 [phone]

## 2016-08-28 NOTE — ED Triage Notes (Addendum)
Pt ambulatory to triage with steady gait, no distress noted. Pt reports falling while carrying a glass vase. Pt cut the base of the index finger with broken glass vase. Bleeding controled at this time. On assessment there is a 1 inch laceration noted at the base of the index finger. Denies numbness.

## 2016-08-28 NOTE — ED Notes (Addendum)
Pt. States broken vase cut finger when fell carrying vase

## 2016-08-29 NOTE — ED Notes (Signed)
Pt. Verbalizes understanding of d/c instructions, and follow-up. VS stable and pain controlled per pt.  Pt. In NAD at time of d/c and denies further concerns regarding this visit. Pt. Stable at the time of departure from the unit, departing unit by the safest and most appropriate manner per that pt condition and limitations. Pt advised to return to the ED at any time for emergent concerns, or for new/worsening symptoms.   

## 2016-09-05 DIAGNOSIS — L03012 Cellulitis of left finger: Secondary | ICD-10-CM | POA: Diagnosis not present

## 2016-09-05 DIAGNOSIS — S61211D Laceration without foreign body of left index finger without damage to nail, subsequent encounter: Secondary | ICD-10-CM | POA: Diagnosis not present

## 2016-09-10 DIAGNOSIS — Z4802 Encounter for removal of sutures: Secondary | ICD-10-CM | POA: Diagnosis not present

## 2016-09-10 DIAGNOSIS — S61211D Laceration without foreign body of left index finger without damage to nail, subsequent encounter: Secondary | ICD-10-CM | POA: Diagnosis not present

## 2016-09-26 DIAGNOSIS — H2513 Age-related nuclear cataract, bilateral: Secondary | ICD-10-CM | POA: Diagnosis not present

## 2016-10-09 ENCOUNTER — Other Ambulatory Visit: Payer: Self-pay | Admitting: Family Medicine

## 2016-10-09 DIAGNOSIS — Z1231 Encounter for screening mammogram for malignant neoplasm of breast: Secondary | ICD-10-CM

## 2016-10-19 DIAGNOSIS — E7800 Pure hypercholesterolemia, unspecified: Secondary | ICD-10-CM | POA: Diagnosis not present

## 2016-10-19 DIAGNOSIS — Z79899 Other long term (current) drug therapy: Secondary | ICD-10-CM | POA: Diagnosis not present

## 2016-10-26 DIAGNOSIS — Z0000 Encounter for general adult medical examination without abnormal findings: Secondary | ICD-10-CM | POA: Diagnosis not present

## 2016-11-15 ENCOUNTER — Ambulatory Visit: Payer: PPO

## 2016-11-16 DIAGNOSIS — M1612 Unilateral primary osteoarthritis, left hip: Secondary | ICD-10-CM | POA: Diagnosis not present

## 2016-11-16 DIAGNOSIS — M7062 Trochanteric bursitis, left hip: Secondary | ICD-10-CM | POA: Diagnosis not present

## 2016-12-10 DIAGNOSIS — K5909 Other constipation: Secondary | ICD-10-CM | POA: Diagnosis not present

## 2016-12-10 DIAGNOSIS — K219 Gastro-esophageal reflux disease without esophagitis: Secondary | ICD-10-CM | POA: Diagnosis not present

## 2016-12-14 ENCOUNTER — Ambulatory Visit: Payer: PPO

## 2016-12-27 DIAGNOSIS — M1612 Unilateral primary osteoarthritis, left hip: Secondary | ICD-10-CM | POA: Diagnosis not present

## 2017-01-01 ENCOUNTER — Ambulatory Visit
Admission: RE | Admit: 2017-01-01 | Discharge: 2017-01-01 | Disposition: A | Discharge status: Home / Self Care | Payer: PPO | Source: Ambulatory Visit | Attending: Family Medicine | Admitting: Family Medicine

## 2017-01-01 DIAGNOSIS — Z1231 Encounter for screening mammogram for malignant neoplasm of breast: Secondary | ICD-10-CM

## 2017-01-08 DIAGNOSIS — M150 Primary generalized (osteo)arthritis: Secondary | ICD-10-CM | POA: Diagnosis not present

## 2017-01-08 DIAGNOSIS — M545 Low back pain: Secondary | ICD-10-CM | POA: Diagnosis not present

## 2017-01-08 DIAGNOSIS — M1612 Unilateral primary osteoarthritis, left hip: Secondary | ICD-10-CM | POA: Diagnosis not present

## 2017-01-08 DIAGNOSIS — G8929 Other chronic pain: Secondary | ICD-10-CM | POA: Diagnosis not present

## 2017-01-23 DIAGNOSIS — M1612 Unilateral primary osteoarthritis, left hip: Secondary | ICD-10-CM | POA: Diagnosis not present

## 2017-01-23 DIAGNOSIS — M25552 Pain in left hip: Secondary | ICD-10-CM | POA: Diagnosis not present

## 2017-02-28 DIAGNOSIS — M79675 Pain in left toe(s): Secondary | ICD-10-CM | POA: Diagnosis not present

## 2017-02-28 DIAGNOSIS — M2041 Other hammer toe(s) (acquired), right foot: Secondary | ICD-10-CM | POA: Diagnosis not present

## 2017-02-28 DIAGNOSIS — B351 Tinea unguium: Secondary | ICD-10-CM | POA: Diagnosis not present

## 2017-02-28 DIAGNOSIS — M79674 Pain in right toe(s): Secondary | ICD-10-CM | POA: Diagnosis not present

## 2017-02-28 DIAGNOSIS — M2011 Hallux valgus (acquired), right foot: Secondary | ICD-10-CM | POA: Diagnosis not present

## 2017-03-20 ENCOUNTER — Other Ambulatory Visit: Payer: Self-pay | Admitting: Orthopedic Surgery

## 2017-03-20 DIAGNOSIS — M1612 Unilateral primary osteoarthritis, left hip: Secondary | ICD-10-CM

## 2017-04-03 ENCOUNTER — Ambulatory Visit
Admission: RE | Admit: 2017-04-03 | Discharge: 2017-04-03 | Disposition: A | Discharge status: Home / Self Care | Payer: PPO | Source: Ambulatory Visit | Attending: Orthopedic Surgery | Admitting: Orthopedic Surgery

## 2017-04-03 DIAGNOSIS — S76012A Strain of muscle, fascia and tendon of left hip, initial encounter: Secondary | ICD-10-CM | POA: Diagnosis not present

## 2017-04-03 DIAGNOSIS — S76812A Strain of other specified muscles, fascia and tendons at thigh level, left thigh, initial encounter: Secondary | ICD-10-CM | POA: Diagnosis not present

## 2017-04-03 DIAGNOSIS — X58XXXA Exposure to other specified factors, initial encounter: Secondary | ICD-10-CM | POA: Diagnosis not present

## 2017-04-03 DIAGNOSIS — M47816 Spondylosis without myelopathy or radiculopathy, lumbar region: Secondary | ICD-10-CM | POA: Insufficient documentation

## 2017-04-03 DIAGNOSIS — M1612 Unilateral primary osteoarthritis, left hip: Secondary | ICD-10-CM

## 2017-04-03 DIAGNOSIS — M25552 Pain in left hip: Secondary | ICD-10-CM | POA: Diagnosis not present

## 2017-04-03 DIAGNOSIS — M160 Bilateral primary osteoarthritis of hip: Secondary | ICD-10-CM | POA: Diagnosis not present

## 2017-04-22 DIAGNOSIS — M5136 Other intervertebral disc degeneration, lumbar region: Secondary | ICD-10-CM | POA: Diagnosis not present

## 2017-04-22 DIAGNOSIS — M1612 Unilateral primary osteoarthritis, left hip: Secondary | ICD-10-CM | POA: Diagnosis not present

## 2017-04-26 DIAGNOSIS — Z79899 Other long term (current) drug therapy: Secondary | ICD-10-CM | POA: Diagnosis not present

## 2017-04-26 DIAGNOSIS — G8929 Other chronic pain: Secondary | ICD-10-CM | POA: Diagnosis not present

## 2017-04-26 DIAGNOSIS — M5136 Other intervertebral disc degeneration, lumbar region: Secondary | ICD-10-CM | POA: Diagnosis not present

## 2017-04-26 DIAGNOSIS — M25551 Pain in right hip: Secondary | ICD-10-CM | POA: Diagnosis not present

## 2017-04-26 DIAGNOSIS — M25552 Pain in left hip: Secondary | ICD-10-CM | POA: Diagnosis not present

## 2017-04-26 DIAGNOSIS — E7800 Pure hypercholesterolemia, unspecified: Secondary | ICD-10-CM | POA: Diagnosis not present

## 2017-04-26 DIAGNOSIS — M545 Low back pain: Secondary | ICD-10-CM | POA: Diagnosis not present

## 2017-04-26 DIAGNOSIS — R531 Weakness: Secondary | ICD-10-CM | POA: Diagnosis not present

## 2017-04-29 DIAGNOSIS — M5136 Other intervertebral disc degeneration, lumbar region: Secondary | ICD-10-CM | POA: Diagnosis not present

## 2017-04-29 DIAGNOSIS — M25551 Pain in right hip: Secondary | ICD-10-CM | POA: Diagnosis not present

## 2017-04-29 DIAGNOSIS — R531 Weakness: Secondary | ICD-10-CM | POA: Diagnosis not present

## 2017-04-29 DIAGNOSIS — M25552 Pain in left hip: Secondary | ICD-10-CM | POA: Diagnosis not present

## 2017-05-01 DIAGNOSIS — R531 Weakness: Secondary | ICD-10-CM | POA: Diagnosis not present

## 2017-05-01 DIAGNOSIS — M5136 Other intervertebral disc degeneration, lumbar region: Secondary | ICD-10-CM | POA: Diagnosis not present

## 2017-05-01 DIAGNOSIS — M25551 Pain in right hip: Secondary | ICD-10-CM | POA: Diagnosis not present

## 2017-05-01 DIAGNOSIS — M25552 Pain in left hip: Secondary | ICD-10-CM | POA: Diagnosis not present

## 2017-05-03 DIAGNOSIS — Z79899 Other long term (current) drug therapy: Secondary | ICD-10-CM | POA: Diagnosis not present

## 2017-05-03 DIAGNOSIS — G894 Chronic pain syndrome: Secondary | ICD-10-CM | POA: Diagnosis not present

## 2017-05-03 DIAGNOSIS — I10 Essential (primary) hypertension: Secondary | ICD-10-CM | POA: Diagnosis not present

## 2017-05-03 DIAGNOSIS — E7800 Pure hypercholesterolemia, unspecified: Secondary | ICD-10-CM | POA: Diagnosis not present

## 2017-05-03 DIAGNOSIS — F411 Generalized anxiety disorder: Secondary | ICD-10-CM | POA: Diagnosis not present

## 2017-05-06 DIAGNOSIS — R531 Weakness: Secondary | ICD-10-CM | POA: Diagnosis not present

## 2017-05-06 DIAGNOSIS — M5136 Other intervertebral disc degeneration, lumbar region: Secondary | ICD-10-CM | POA: Diagnosis not present

## 2017-05-06 DIAGNOSIS — M1612 Unilateral primary osteoarthritis, left hip: Secondary | ICD-10-CM | POA: Diagnosis not present

## 2017-05-08 DIAGNOSIS — M5136 Other intervertebral disc degeneration, lumbar region: Secondary | ICD-10-CM | POA: Diagnosis not present

## 2017-05-08 DIAGNOSIS — R531 Weakness: Secondary | ICD-10-CM | POA: Diagnosis not present

## 2017-05-13 DIAGNOSIS — M5136 Other intervertebral disc degeneration, lumbar region: Secondary | ICD-10-CM | POA: Diagnosis not present

## 2017-05-13 DIAGNOSIS — R531 Weakness: Secondary | ICD-10-CM | POA: Diagnosis not present

## 2017-05-17 DIAGNOSIS — M25552 Pain in left hip: Secondary | ICD-10-CM | POA: Diagnosis not present

## 2017-05-17 DIAGNOSIS — M5136 Other intervertebral disc degeneration, lumbar region: Secondary | ICD-10-CM | POA: Diagnosis not present

## 2017-05-17 DIAGNOSIS — M25551 Pain in right hip: Secondary | ICD-10-CM | POA: Diagnosis not present

## 2017-05-17 DIAGNOSIS — M545 Low back pain: Secondary | ICD-10-CM | POA: Diagnosis not present

## 2017-05-17 DIAGNOSIS — R531 Weakness: Secondary | ICD-10-CM | POA: Diagnosis not present

## 2017-05-17 DIAGNOSIS — G8929 Other chronic pain: Secondary | ICD-10-CM | POA: Diagnosis not present

## 2017-05-27 DIAGNOSIS — M545 Low back pain: Secondary | ICD-10-CM | POA: Diagnosis not present

## 2017-05-27 DIAGNOSIS — R531 Weakness: Secondary | ICD-10-CM | POA: Diagnosis not present

## 2017-05-27 DIAGNOSIS — M25552 Pain in left hip: Secondary | ICD-10-CM | POA: Diagnosis not present

## 2017-05-27 DIAGNOSIS — M5136 Other intervertebral disc degeneration, lumbar region: Secondary | ICD-10-CM | POA: Diagnosis not present

## 2017-05-27 DIAGNOSIS — M25551 Pain in right hip: Secondary | ICD-10-CM | POA: Diagnosis not present

## 2017-05-27 DIAGNOSIS — G8929 Other chronic pain: Secondary | ICD-10-CM | POA: Diagnosis not present

## 2017-05-29 DIAGNOSIS — M25551 Pain in right hip: Secondary | ICD-10-CM | POA: Diagnosis not present

## 2017-05-29 DIAGNOSIS — M5136 Other intervertebral disc degeneration, lumbar region: Secondary | ICD-10-CM | POA: Diagnosis not present

## 2017-05-29 DIAGNOSIS — R531 Weakness: Secondary | ICD-10-CM | POA: Diagnosis not present

## 2017-05-29 DIAGNOSIS — M25552 Pain in left hip: Secondary | ICD-10-CM | POA: Diagnosis not present

## 2017-06-03 DIAGNOSIS — M25552 Pain in left hip: Secondary | ICD-10-CM | POA: Diagnosis not present

## 2017-06-03 DIAGNOSIS — M25551 Pain in right hip: Secondary | ICD-10-CM | POA: Diagnosis not present

## 2017-06-03 DIAGNOSIS — M1612 Unilateral primary osteoarthritis, left hip: Secondary | ICD-10-CM | POA: Diagnosis not present

## 2017-06-03 DIAGNOSIS — R531 Weakness: Secondary | ICD-10-CM | POA: Diagnosis not present

## 2017-06-03 DIAGNOSIS — M5136 Other intervertebral disc degeneration, lumbar region: Secondary | ICD-10-CM | POA: Diagnosis not present

## 2017-06-05 DIAGNOSIS — M25552 Pain in left hip: Secondary | ICD-10-CM | POA: Diagnosis not present

## 2017-06-05 DIAGNOSIS — R531 Weakness: Secondary | ICD-10-CM | POA: Diagnosis not present

## 2017-06-05 DIAGNOSIS — M25551 Pain in right hip: Secondary | ICD-10-CM | POA: Diagnosis not present

## 2017-06-05 DIAGNOSIS — M5136 Other intervertebral disc degeneration, lumbar region: Secondary | ICD-10-CM | POA: Diagnosis not present

## 2017-07-05 ENCOUNTER — Other Ambulatory Visit: Payer: Self-pay | Admitting: Orthopedic Surgery

## 2017-07-05 DIAGNOSIS — M9963 Osseous and subluxation stenosis of intervertebral foramina of lumbar region: Secondary | ICD-10-CM

## 2017-07-16 ENCOUNTER — Ambulatory Visit
Admission: RE | Admit: 2017-07-16 | Discharge: 2017-07-16 | Disposition: A | Discharge status: Home / Self Care | Payer: PPO | Source: Ambulatory Visit | Attending: Orthopedic Surgery | Admitting: Orthopedic Surgery

## 2017-07-16 DIAGNOSIS — M5126 Other intervertebral disc displacement, lumbar region: Secondary | ICD-10-CM | POA: Insufficient documentation

## 2017-07-16 DIAGNOSIS — M7138 Other bursal cyst, other site: Secondary | ICD-10-CM | POA: Insufficient documentation

## 2017-07-16 DIAGNOSIS — M5137 Other intervertebral disc degeneration, lumbosacral region: Secondary | ICD-10-CM | POA: Insufficient documentation

## 2017-07-16 DIAGNOSIS — M47816 Spondylosis without myelopathy or radiculopathy, lumbar region: Secondary | ICD-10-CM | POA: Diagnosis not present

## 2017-07-16 DIAGNOSIS — D1809 Hemangioma of other sites: Secondary | ICD-10-CM | POA: Diagnosis not present

## 2017-07-16 DIAGNOSIS — M545 Low back pain: Secondary | ICD-10-CM | POA: Diagnosis not present

## 2017-07-16 DIAGNOSIS — M4826 Kissing spine, lumbar region: Secondary | ICD-10-CM | POA: Diagnosis not present

## 2017-07-16 DIAGNOSIS — M48061 Spinal stenosis, lumbar region without neurogenic claudication: Secondary | ICD-10-CM | POA: Diagnosis not present

## 2017-07-16 DIAGNOSIS — M5136 Other intervertebral disc degeneration, lumbar region: Secondary | ICD-10-CM | POA: Insufficient documentation

## 2017-07-16 DIAGNOSIS — M9963 Osseous and subluxation stenosis of intervertebral foramina of lumbar region: Secondary | ICD-10-CM | POA: Insufficient documentation

## 2017-09-13 DIAGNOSIS — M5136 Other intervertebral disc degeneration, lumbar region: Secondary | ICD-10-CM | POA: Diagnosis not present

## 2017-09-13 DIAGNOSIS — M5416 Radiculopathy, lumbar region: Secondary | ICD-10-CM | POA: Diagnosis not present

## 2017-09-13 DIAGNOSIS — M48062 Spinal stenosis, lumbar region with neurogenic claudication: Secondary | ICD-10-CM | POA: Diagnosis not present

## 2017-11-01 DIAGNOSIS — M5416 Radiculopathy, lumbar region: Secondary | ICD-10-CM | POA: Diagnosis not present

## 2017-11-01 DIAGNOSIS — M5136 Other intervertebral disc degeneration, lumbar region: Secondary | ICD-10-CM | POA: Diagnosis not present

## 2017-11-01 DIAGNOSIS — M48062 Spinal stenosis, lumbar region with neurogenic claudication: Secondary | ICD-10-CM | POA: Diagnosis not present

## 2017-11-08 DIAGNOSIS — Z79899 Other long term (current) drug therapy: Secondary | ICD-10-CM | POA: Diagnosis not present

## 2017-11-08 DIAGNOSIS — E7800 Pure hypercholesterolemia, unspecified: Secondary | ICD-10-CM | POA: Diagnosis not present

## 2017-11-14 DIAGNOSIS — Z79899 Other long term (current) drug therapy: Secondary | ICD-10-CM | POA: Diagnosis not present

## 2017-11-14 DIAGNOSIS — M5441 Lumbago with sciatica, right side: Secondary | ICD-10-CM | POA: Diagnosis not present

## 2017-11-14 DIAGNOSIS — G8929 Other chronic pain: Secondary | ICD-10-CM | POA: Diagnosis not present

## 2017-11-14 DIAGNOSIS — Z0000 Encounter for general adult medical examination without abnormal findings: Secondary | ICD-10-CM | POA: Diagnosis not present

## 2017-11-14 DIAGNOSIS — I10 Essential (primary) hypertension: Secondary | ICD-10-CM | POA: Diagnosis not present

## 2017-11-14 DIAGNOSIS — E7800 Pure hypercholesterolemia, unspecified: Secondary | ICD-10-CM | POA: Diagnosis not present

## 2017-12-09 DIAGNOSIS — K5909 Other constipation: Secondary | ICD-10-CM | POA: Diagnosis not present

## 2017-12-09 DIAGNOSIS — K219 Gastro-esophageal reflux disease without esophagitis: Secondary | ICD-10-CM | POA: Diagnosis not present

## 2017-12-09 DIAGNOSIS — Z1211 Encounter for screening for malignant neoplasm of colon: Secondary | ICD-10-CM | POA: Diagnosis not present

## 2018-01-06 ENCOUNTER — Other Ambulatory Visit: Payer: Self-pay | Admitting: Family Medicine

## 2018-01-06 DIAGNOSIS — Z1231 Encounter for screening mammogram for malignant neoplasm of breast: Secondary | ICD-10-CM

## 2018-01-17 ENCOUNTER — Ambulatory Visit
Admission: RE | Admit: 2018-01-17 | Discharge: 2018-01-17 | Disposition: A | Discharge status: Home / Self Care | Payer: PPO | Source: Ambulatory Visit | Attending: Family Medicine | Admitting: Family Medicine

## 2018-01-17 DIAGNOSIS — Z1231 Encounter for screening mammogram for malignant neoplasm of breast: Secondary | ICD-10-CM | POA: Diagnosis not present

## 2018-05-09 DIAGNOSIS — Z79899 Other long term (current) drug therapy: Secondary | ICD-10-CM | POA: Diagnosis not present

## 2018-05-09 DIAGNOSIS — E7800 Pure hypercholesterolemia, unspecified: Secondary | ICD-10-CM | POA: Diagnosis not present

## 2018-05-16 DIAGNOSIS — K219 Gastro-esophageal reflux disease without esophagitis: Secondary | ICD-10-CM | POA: Diagnosis not present

## 2018-05-16 DIAGNOSIS — M545 Low back pain: Secondary | ICD-10-CM | POA: Diagnosis not present

## 2018-05-16 DIAGNOSIS — I10 Essential (primary) hypertension: Secondary | ICD-10-CM | POA: Diagnosis not present

## 2018-05-16 DIAGNOSIS — J309 Allergic rhinitis, unspecified: Secondary | ICD-10-CM | POA: Diagnosis not present

## 2018-05-16 DIAGNOSIS — Z79899 Other long term (current) drug therapy: Secondary | ICD-10-CM | POA: Diagnosis not present

## 2018-05-16 DIAGNOSIS — E7800 Pure hypercholesterolemia, unspecified: Secondary | ICD-10-CM | POA: Diagnosis not present

## 2018-05-16 DIAGNOSIS — G8929 Other chronic pain: Secondary | ICD-10-CM | POA: Diagnosis not present

## 2018-05-19 DIAGNOSIS — M9963 Osseous and subluxation stenosis of intervertebral foramina of lumbar region: Secondary | ICD-10-CM | POA: Diagnosis not present

## 2018-05-23 DIAGNOSIS — M48062 Spinal stenosis, lumbar region with neurogenic claudication: Secondary | ICD-10-CM | POA: Diagnosis not present

## 2018-05-23 DIAGNOSIS — M5416 Radiculopathy, lumbar region: Secondary | ICD-10-CM | POA: Diagnosis not present

## 2018-05-23 DIAGNOSIS — M5136 Other intervertebral disc degeneration, lumbar region: Secondary | ICD-10-CM | POA: Diagnosis not present

## 2018-06-20 DIAGNOSIS — M5416 Radiculopathy, lumbar region: Secondary | ICD-10-CM | POA: Diagnosis not present

## 2018-06-20 DIAGNOSIS — M48062 Spinal stenosis, lumbar region with neurogenic claudication: Secondary | ICD-10-CM | POA: Diagnosis not present

## 2018-06-20 DIAGNOSIS — M5136 Other intervertebral disc degeneration, lumbar region: Secondary | ICD-10-CM | POA: Diagnosis not present

## 2018-07-25 DIAGNOSIS — M5441 Lumbago with sciatica, right side: Secondary | ICD-10-CM | POA: Diagnosis not present

## 2018-07-25 DIAGNOSIS — M542 Cervicalgia: Secondary | ICD-10-CM | POA: Diagnosis not present

## 2018-07-25 DIAGNOSIS — M5442 Lumbago with sciatica, left side: Secondary | ICD-10-CM | POA: Diagnosis not present

## 2018-07-25 DIAGNOSIS — G8929 Other chronic pain: Secondary | ICD-10-CM | POA: Diagnosis not present

## 2018-07-25 DIAGNOSIS — M4316 Spondylolisthesis, lumbar region: Secondary | ICD-10-CM | POA: Diagnosis not present

## 2018-07-29 ENCOUNTER — Other Ambulatory Visit: Payer: Self-pay | Admitting: Neurosurgery

## 2018-07-29 DIAGNOSIS — M5441 Lumbago with sciatica, right side: Secondary | ICD-10-CM

## 2018-07-29 DIAGNOSIS — R292 Abnormal reflex: Secondary | ICD-10-CM

## 2018-07-29 DIAGNOSIS — M5442 Lumbago with sciatica, left side: Principal | ICD-10-CM

## 2018-08-10 ENCOUNTER — Ambulatory Visit
Admission: RE | Admit: 2018-08-10 | Discharge: 2018-08-10 | Disposition: A | Discharge status: Home / Self Care | Payer: PPO | Source: Ambulatory Visit | Attending: Neurosurgery | Admitting: Neurosurgery

## 2018-08-10 DIAGNOSIS — M5441 Lumbago with sciatica, right side: Secondary | ICD-10-CM | POA: Diagnosis not present

## 2018-08-10 DIAGNOSIS — M4802 Spinal stenosis, cervical region: Secondary | ICD-10-CM | POA: Insufficient documentation

## 2018-08-10 DIAGNOSIS — M48061 Spinal stenosis, lumbar region without neurogenic claudication: Secondary | ICD-10-CM | POA: Insufficient documentation

## 2018-08-10 DIAGNOSIS — M5030 Other cervical disc degeneration, unspecified cervical region: Secondary | ICD-10-CM | POA: Insufficient documentation

## 2018-08-10 DIAGNOSIS — M5442 Lumbago with sciatica, left side: Secondary | ICD-10-CM | POA: Insufficient documentation

## 2018-08-10 DIAGNOSIS — M7138 Other bursal cyst, other site: Secondary | ICD-10-CM | POA: Diagnosis not present

## 2018-08-10 DIAGNOSIS — M4316 Spondylolisthesis, lumbar region: Secondary | ICD-10-CM | POA: Insufficient documentation

## 2018-08-10 DIAGNOSIS — R292 Abnormal reflex: Secondary | ICD-10-CM | POA: Insufficient documentation

## 2018-08-19 DIAGNOSIS — M4316 Spondylolisthesis, lumbar region: Secondary | ICD-10-CM | POA: Diagnosis not present

## 2018-08-19 DIAGNOSIS — Z6823 Body mass index (BMI) 23.0-23.9, adult: Secondary | ICD-10-CM | POA: Diagnosis not present

## 2018-08-25 ENCOUNTER — Other Ambulatory Visit: Payer: Self-pay | Admitting: Neurosurgery

## 2018-09-15 DIAGNOSIS — J0100 Acute maxillary sinusitis, unspecified: Secondary | ICD-10-CM | POA: Diagnosis not present

## 2018-10-10 NOTE — Pre-Procedure Instructions (Signed)
Brittney Ramsey  10/10/2018      [facility] - [place], [place] - [address] [place] [address] Phone: [phone] Fax: [phone]    Your procedure is scheduled on Mon., Dec. 9, 2019 from 7:30AM-12:41PM  Report to [facility] Admitting Entrance "A" at 5:30AM  Call this number if you have problems the morning of surgery:  [phone]   Remember:  Do not eat or drink after midnight on Dec. 8th    Take these medicines the morning of surgery with A SIP OF WATER: DULoxetine (CYMBALTA), Montelukast (SINGULAIR), and Pantoprazole (PROTONIX)  If needed: Acetaminophen (TYLENOL), Fluticasone (FLONASE), and TraMADol (ULTRAM)   Follow your surgeon's instructions on when to stop Aspirin.  If no instructions were given by your surgeon then you will need to call the office to get those instructions.    As of today, stop taking all Other Aspirin Products, Vitamins, Fish oils, and Herbal medications. Also stop all NSAIDS i.e. Advil, Ibuprofen, Motrin, Aleve, Anaprox, Naproxen, BC, Goody Powders, and all Supplements. Including: Meloxicam (MOBIC) and Misc Natural Products (OSTEO BI-FLEX ADV TRIPLE ST)   Do not wear jewelry, make-up or nail polish.  Do not wear lotions, powders, or perfumes, or deodorant.  Do not shave 48 hours prior to surgery.    Do not bring valuables to the hospital.  [facility] is not responsible for any belongings or valuables.  Contacts, dentures or bridgework may not be worn into surgery.  Leave your suitcase in the car.  After surgery it may be brought to your room.  For patients admitted to the hospital, discharge time will be determined by your treatment team.  Patients discharged the day of surgery will not be allowed to drive home.   Special instructions:  [facility]- Preparing For Surgery  Before surgery, you can play an important role. Because skin is not sterile, your skin needs to be as free of germs  as possible. You can reduce the number of germs on your skin by washing with CHG (chlorahexidine gluconate) Soap before surgery.  CHG is an antiseptic cleaner which kills germs and bonds with the skin to continue killing germs even after washing.    Oral Hygiene is also important to reduce your risk of infection.  Remember - BRUSH YOUR TEETH THE MORNING OF SURGERY WITH YOUR REGULAR TOOTHPASTE  Please do not use if you have an allergy to CHG or antibacterial soaps. If your skin becomes reddened/irritated stop using the CHG.  Do not shave (including legs and underarms) for at least 48 hours prior to first CHG shower. It is OK to shave your face.  Please follow these instructions carefully.   1. Shower the NIGHT BEFORE SURGERY and the MORNING OF SURGERY with CHG.   2. If you chose to wash your hair, wash your hair first as usual with your normal shampoo.  3. After you shampoo, rinse your hair and body thoroughly to remove the shampoo.  4. Use CHG as you would any other liquid soap. You can apply CHG directly to the skin and wash gently with a scrungie or a clean washcloth.   5. Apply the CHG Soap to your body ONLY FROM THE NECK DOWN.  Do not use on open wounds or open sores. Avoid contact with your eyes, ears, mouth and genitals (private parts). Wash Face and genitals (private parts)  with your normal soap.  6. Wash thoroughly, paying special attention to the area where  your surgery will be performed.  7. Thoroughly rinse your body with warm water from the neck down.  8. DO NOT shower/wash with your normal soap after using and rinsing off the CHG Soap.  9. Pat yourself dry with a CLEAN TOWEL.  10. Wear CLEAN PAJAMAS to bed the night before surgery, wear comfortable clothes the morning of surgery  11. Place CLEAN SHEETS on your bed the night of your first shower and DO NOT SLEEP WITH PETS.  Day of Surgery:  Do not apply any deodorants/lotions.  Please wear clean clothes to the  hospital/surgery center.   Remember to brush your teeth WITH YOUR REGULAR TOOTHPASTE.  Please read over the following fact sheets that you were given. Pain Booklet, Coughing and Deep Breathing, MRSA Information and Surgical Site Infection Prevention

## 2018-10-13 ENCOUNTER — Encounter (HOSPITAL_COMMUNITY): Payer: Self-pay

## 2018-10-13 ENCOUNTER — Other Ambulatory Visit: Payer: Self-pay

## 2018-10-13 ENCOUNTER — Encounter (HOSPITAL_COMMUNITY)
Admission: RE | Admit: 2018-10-13 | Discharge: 2018-10-13 | Disposition: A | Discharge status: Home / Self Care | Payer: PPO | Source: Ambulatory Visit | Attending: Neurosurgery | Admitting: Neurosurgery

## 2018-10-13 DIAGNOSIS — Z01818 Encounter for other preprocedural examination: Secondary | ICD-10-CM | POA: Diagnosis not present

## 2018-10-13 HISTORY — DX: Adverse effect of unspecified anesthetic, initial encounter: T41.45XA

## 2018-10-13 HISTORY — DX: Other specified postprocedural states: R11.2

## 2018-10-13 HISTORY — DX: Nausea with vomiting, unspecified: Z98.890

## 2018-10-13 HISTORY — DX: Other complications of anesthesia, initial encounter: T88.59XA

## 2018-10-13 LAB — CBC
HCT: 35.4 % — ABNORMAL LOW (ref 36.0–46.0)
Hemoglobin: 11 g/dL — ABNORMAL LOW (ref 12.0–15.0)
MCH: 30.6 pg (ref 26.0–34.0)
MCHC: 31.1 g/dL (ref 30.0–36.0)
MCV: 98.6 fL (ref 80.0–100.0)
NRBC: 0 % (ref 0.0–0.2)
PLATELETS: 322 10*3/uL (ref 150–400)
RBC: 3.59 MIL/uL — AB (ref 3.87–5.11)
RDW: 13.7 % (ref 11.5–15.5)
WBC: 5.4 10*3/uL (ref 4.0–10.5)

## 2018-10-13 LAB — SURGICAL PCR SCREEN
MRSA, PCR: NEGATIVE
Staphylococcus aureus: NEGATIVE

## 2018-10-13 LAB — TYPE AND SCREEN
ABO/RH(D): A POS
Antibody Screen: NEGATIVE

## 2018-10-13 LAB — BASIC METABOLIC PANEL
ANION GAP: 10 (ref 5–15)
BUN: 18 mg/dL (ref 8–23)
CALCIUM: 9.5 mg/dL (ref 8.9–10.3)
CO2: 28 mmol/L (ref 22–32)
CREATININE: 1.05 mg/dL — AB (ref 0.44–1.00)
Chloride: 99 mmol/L (ref 98–111)
GFR calc non Af Amer: 52 mL/min — ABNORMAL LOW (ref >60)
Glucose, Bld: 111 mg/dL — ABNORMAL HIGH (ref 70–99)
Potassium: 3.5 mmol/L (ref 3.5–5.1)
Sodium: 137 mmol/L (ref 135–145)

## 2018-10-13 LAB — ABO/RH: ABO/RH(D): A POS

## 2018-10-13 NOTE — Progress Notes (Signed)
PCP - Dr. Doug Sou   Cardiologist - Denies  Chest x-ray - Deneis  EKG - 10/13/18  Stress Test - Denies  ECHO - Denies  Cardiac Cath - Denies  AICD- na PM- na LOOP- na  Sleep Study - Denies CPAP - None  LABS- 10/13/18: CBC, BMP, T/S, PCR  ASA- LD- 11/23  Anesthesia- No  Pt denies having chest pain, sob, or fever at this time. All instructions explained to the pt, with a verbal understanding of the material. Pt agrees to go over the instructions while at home for a better understanding. The opportunity to ask questions was provided.

## 2018-10-15 ENCOUNTER — Other Ambulatory Visit: Payer: Self-pay | Admitting: Neurosurgery

## 2018-10-19 NOTE — Anesthesia Preprocedure Evaluation (Addendum)
Anesthesia Evaluation  Patient identified by MRN, date of birth, ID band Patient awake    Reviewed: Allergy & Precautions, NPO status , Patient's Chart, lab work & pertinent test results  History of Anesthesia Complications (+) PONV  Airway Mallampati: II  TM Distance: >3 FB     Dental  (+) Teeth Intact, Dental Advisory Given,    Pulmonary former smoker,    breath sounds clear to auscultation       Cardiovascular hypertension,  Rhythm:Regular Rate:Normal     Neuro/Psych    GI/Hepatic   Endo/Other    Renal/GU      Musculoskeletal  (+) Arthritis ,   Abdominal   Peds  Hematology   Anesthesia Other Findings   Reproductive/Obstetrics                           Anesthesia Physical Anesthesia Plan  ASA: III  Anesthesia Plan: General   Post-op Pain Management:    Induction: Intravenous  PONV Risk Score and Plan: Treatment may vary due to age or medical condition, Ondansetron, Dexamethasone and Midazolam  Airway Management Planned: Oral ETT  Additional Equipment:   Intra-op Plan:   Post-operative Plan: Possible Post-op intubation/ventilation  Informed Consent: I have reviewed the patients History and Physical, chart, labs and discussed the procedure including the risks, benefits and alternatives for the proposed anesthesia with the patient or authorized representative who has indicated his/her understanding and acceptance.   Dental advisory given  Plan Discussed with: CRNA and Anesthesiologist  Anesthesia Plan Comments:         Anesthesia Quick Evaluation

## 2018-10-20 ENCOUNTER — Encounter (HOSPITAL_COMMUNITY)
Admission: RE | Disposition: A | Discharge status: Home / Self Care | Payer: Self-pay | Source: Home / Self Care | Attending: Neurosurgery

## 2018-10-20 ENCOUNTER — Inpatient Hospital Stay (HOSPITAL_COMMUNITY)
Admission: RE | Admit: 2018-10-20 | Discharge: 2018-10-22 | DRG: 455 | Disposition: A | Discharge status: Home / Self Care | Payer: PPO | Attending: Neurosurgery | Admitting: Neurosurgery

## 2018-10-20 ENCOUNTER — Inpatient Hospital Stay (HOSPITAL_COMMUNITY): Payer: PPO | Admitting: Anesthesiology

## 2018-10-20 ENCOUNTER — Inpatient Hospital Stay (HOSPITAL_COMMUNITY): Payer: PPO

## 2018-10-20 ENCOUNTER — Encounter (HOSPITAL_COMMUNITY): Payer: Self-pay | Admitting: Certified Registered"

## 2018-10-20 DIAGNOSIS — Z79891 Long term (current) use of opiate analgesic: Secondary | ICD-10-CM | POA: Diagnosis not present

## 2018-10-20 DIAGNOSIS — I10 Essential (primary) hypertension: Secondary | ICD-10-CM | POA: Diagnosis not present

## 2018-10-20 DIAGNOSIS — Z7982 Long term (current) use of aspirin: Secondary | ICD-10-CM | POA: Diagnosis not present

## 2018-10-20 DIAGNOSIS — M48062 Spinal stenosis, lumbar region with neurogenic claudication: Secondary | ICD-10-CM | POA: Diagnosis not present

## 2018-10-20 DIAGNOSIS — M4316 Spondylolisthesis, lumbar region: Secondary | ICD-10-CM | POA: Diagnosis present

## 2018-10-20 DIAGNOSIS — Z881 Allergy status to other antibiotic agents status: Secondary | ICD-10-CM

## 2018-10-20 DIAGNOSIS — Z981 Arthrodesis status: Secondary | ICD-10-CM | POA: Diagnosis not present

## 2018-10-20 DIAGNOSIS — E785 Hyperlipidemia, unspecified: Secondary | ICD-10-CM | POA: Diagnosis not present

## 2018-10-20 DIAGNOSIS — Z791 Long term (current) use of non-steroidal anti-inflammatories (NSAID): Secondary | ICD-10-CM

## 2018-10-20 DIAGNOSIS — M1990 Unspecified osteoarthritis, unspecified site: Secondary | ICD-10-CM | POA: Diagnosis not present

## 2018-10-20 DIAGNOSIS — M5116 Intervertebral disc disorders with radiculopathy, lumbar region: Secondary | ICD-10-CM | POA: Diagnosis present

## 2018-10-20 DIAGNOSIS — Z87891 Personal history of nicotine dependence: Secondary | ICD-10-CM

## 2018-10-20 DIAGNOSIS — Z79899 Other long term (current) drug therapy: Secondary | ICD-10-CM

## 2018-10-20 DIAGNOSIS — M4326 Fusion of spine, lumbar region: Secondary | ICD-10-CM | POA: Diagnosis not present

## 2018-10-20 DIAGNOSIS — Z419 Encounter for procedure for purposes other than remedying health state, unspecified: Secondary | ICD-10-CM

## 2018-10-20 HISTORY — PX: OTHER SURGICAL HISTORY: SHX169

## 2018-10-20 SURGERY — POSTERIOR LUMBAR FUSION 2 LEVEL
Anesthesia: General | Site: Spine Lumbar

## 2018-10-20 MED ORDER — LOSARTAN POTASSIUM-HCTZ 50-12.5 MG PO TABS: 1.0000 | ORAL_TABLET | Freq: Every day | ORAL | Status: DC

## 2018-10-20 MED ORDER — LIDOCAINE 2% (20 MG/ML) 5 ML SYRINGE
INTRAMUSCULAR | Status: DC | PRN
  Administered 2018-10-20: 40 mg via INTRAVENOUS

## 2018-10-20 MED ORDER — SODIUM CHLORIDE 0.9 % IV SOLN: 250.0000 mL | INTRAVENOUS | Status: DC

## 2018-10-20 MED ORDER — LIDOCAINE 2% (20 MG/ML) 5 ML SYRINGE
INTRAMUSCULAR | Status: AC
  Filled 2018-10-20: qty 10

## 2018-10-20 MED ORDER — MORPHINE SULFATE (PF) 4 MG/ML IV SOLN: 4.0000 mg | INTRAVENOUS | Status: DC | PRN

## 2018-10-20 MED ORDER — LOSARTAN POTASSIUM 50 MG PO TABS
50.0000 mg | ORAL_TABLET | Freq: Every day | ORAL | Status: DC
  Administered 2018-10-21 – 2018-10-22 (×2): 50 mg via ORAL
  Filled 2018-10-20 (×2): qty 1

## 2018-10-20 MED ORDER — BUPIVACAINE-EPINEPHRINE (PF) 0.5% -1:200000 IJ SOLN: INTRAMUSCULAR | Status: DC | PRN

## 2018-10-20 MED ORDER — ZOLPIDEM TARTRATE 5 MG PO TABS: 5.0000 mg | ORAL_TABLET | Freq: Every evening | ORAL | Status: DC | PRN

## 2018-10-20 MED ORDER — MIDAZOLAM HCL 2 MG/2ML IJ SOLN
INTRAMUSCULAR | Status: AC
  Filled 2018-10-20: qty 2

## 2018-10-20 MED ORDER — BUPIVACAINE LIPOSOME 1.3 % IJ SUSP
20.0000 mL | INTRAMUSCULAR | Status: AC
  Administered 2018-10-20: 20 mL
  Filled 2018-10-20: qty 20

## 2018-10-20 MED ORDER — VANCOMYCIN HCL 1000 MG IV SOLR
INTRAVENOUS | Status: AC
  Filled 2018-10-20: qty 1000

## 2018-10-20 MED ORDER — FENTANYL CITRATE (PF) 100 MCG/2ML IJ SOLN: 25.0000 ug | INTRAMUSCULAR | Status: DC | PRN

## 2018-10-20 MED ORDER — ACETAMINOPHEN 650 MG RE SUPP: 650.0000 mg | RECTAL | Status: DC | PRN

## 2018-10-20 MED ORDER — ONDANSETRON HCL 4 MG PO TABS: 4.0000 mg | ORAL_TABLET | Freq: Four times a day (QID) | ORAL | Status: DC | PRN

## 2018-10-20 MED ORDER — ONDANSETRON HCL 4 MG/2ML IJ SOLN
INTRAMUSCULAR | Status: AC
  Filled 2018-10-20: qty 4

## 2018-10-20 MED ORDER — THROMBIN 5000 UNITS EX SOLR
OROMUCOSAL | Status: DC | PRN
  Administered 2018-10-20 (×2): via TOPICAL

## 2018-10-20 MED ORDER — BISACODYL 10 MG RE SUPP: 10.0000 mg | Freq: Every day | RECTAL | Status: DC | PRN

## 2018-10-20 MED ORDER — ACETAMINOPHEN 500 MG PO TABS
1000.0000 mg | ORAL_TABLET | Freq: Four times a day (QID) | ORAL | Status: AC
  Administered 2018-10-20 – 2018-10-21 (×3): 1000 mg via ORAL
  Filled 2018-10-20 (×3): qty 2

## 2018-10-20 MED ORDER — FENTANYL CITRATE (PF) 100 MCG/2ML IJ SOLN
INTRAMUSCULAR | Status: DC | PRN
  Administered 2018-10-20 (×5): 50 ug via INTRAVENOUS

## 2018-10-20 MED ORDER — PROPOFOL 10 MG/ML IV BOLUS
INTRAVENOUS | Status: DC | PRN
  Administered 2018-10-20: 150 mg via INTRAVENOUS

## 2018-10-20 MED ORDER — BACITRACIN ZINC 500 UNIT/GM EX OINT
TOPICAL_OINTMENT | CUTANEOUS | Status: DC | PRN
  Administered 2018-10-20: 1 via TOPICAL

## 2018-10-20 MED ORDER — CARBOXYMETHYLCELLUL-GLYCERIN 0.5-0.9 % OP SOLN: 1.0000 [drp] | OPHTHALMIC | Status: DC | PRN

## 2018-10-20 MED ORDER — TRAMADOL HCL 50 MG PO TABS
100.0000 mg | ORAL_TABLET | Freq: Four times a day (QID) | ORAL | Status: DC | PRN
  Administered 2018-10-21: 100 mg via ORAL
  Filled 2018-10-20: qty 2

## 2018-10-20 MED ORDER — VANCOMYCIN HCL 1 G IV SOLR
INTRAVENOUS | Status: DC | PRN
  Administered 2018-10-20: 1000 mg via TOPICAL

## 2018-10-20 MED ORDER — FENTANYL CITRATE (PF) 100 MCG/2ML IJ SOLN
INTRAMUSCULAR | Status: AC
  Administered 2018-10-20: 25 ug via INTRAVENOUS
  Filled 2018-10-20: qty 2

## 2018-10-20 MED ORDER — ONDANSETRON HCL 4 MG/2ML IJ SOLN
INTRAMUSCULAR | Status: DC | PRN
  Administered 2018-10-20: 4 mg via INTRAVENOUS

## 2018-10-20 MED ORDER — CYCLOBENZAPRINE HCL 5 MG PO TABS
5.0000 mg | ORAL_TABLET | Freq: Three times a day (TID) | ORAL | Status: DC | PRN
  Administered 2018-10-21 – 2018-10-22 (×3): 5 mg via ORAL
  Filled 2018-10-20 (×3): qty 1

## 2018-10-20 MED ORDER — MENTHOL 3 MG MT LOZG: 1.0000 | LOZENGE | OROMUCOSAL | Status: DC | PRN

## 2018-10-20 MED ORDER — FENTANYL CITRATE (PF) 250 MCG/5ML IJ SOLN
INTRAMUSCULAR | Status: AC
  Filled 2018-10-20: qty 5

## 2018-10-20 MED ORDER — DULOXETINE HCL 30 MG PO CPEP
60.0000 mg | ORAL_CAPSULE | Freq: Two times a day (BID) | ORAL | Status: DC
  Administered 2018-10-20 – 2018-10-22 (×4): 60 mg via ORAL
  Filled 2018-10-20 (×4): qty 2

## 2018-10-20 MED ORDER — DOCUSATE SODIUM 100 MG PO CAPS
100.0000 mg | ORAL_CAPSULE | Freq: Two times a day (BID) | ORAL | Status: DC
  Administered 2018-10-20 – 2018-10-22 (×4): 100 mg via ORAL
  Filled 2018-10-20 (×4): qty 1

## 2018-10-20 MED ORDER — DEXAMETHASONE SODIUM PHOSPHATE 10 MG/ML IJ SOLN
INTRAMUSCULAR | Status: AC
  Filled 2018-10-20: qty 2

## 2018-10-20 MED ORDER — BUPIVACAINE-EPINEPHRINE (PF) 0.25% -1:200000 IJ SOLN
INTRAMUSCULAR | Status: DC | PRN
  Administered 2018-10-20: 10 mL

## 2018-10-20 MED ORDER — FLUTICASONE PROPIONATE 50 MCG/ACT NA SUSP: 2.0000 | Freq: Every day | NASAL | Status: DC | PRN

## 2018-10-20 MED ORDER — HYDROCHLOROTHIAZIDE 12.5 MG PO CAPS
12.5000 mg | ORAL_CAPSULE | Freq: Every day | ORAL | Status: DC
  Administered 2018-10-21 – 2018-10-22 (×2): 12.5 mg via ORAL
  Filled 2018-10-20 (×2): qty 1

## 2018-10-20 MED ORDER — CYCLOBENZAPRINE HCL 10 MG PO TABS
10.0000 mg | ORAL_TABLET | Freq: Three times a day (TID) | ORAL | Status: DC | PRN
  Administered 2018-10-20: 10 mg via ORAL
  Filled 2018-10-20: qty 1

## 2018-10-20 MED ORDER — CHLORHEXIDINE GLUCONATE CLOTH 2 % EX PADS: 6.0000 | MEDICATED_PAD | Freq: Once | CUTANEOUS | Status: DC

## 2018-10-20 MED ORDER — PHENOL 1.4 % MT LIQD: 1.0000 | OROMUCOSAL | Status: DC | PRN

## 2018-10-20 MED ORDER — MIDAZOLAM HCL 5 MG/5ML IJ SOLN
INTRAMUSCULAR | Status: DC | PRN
  Administered 2018-10-20: 1 mg via INTRAVENOUS

## 2018-10-20 MED ORDER — ROCURONIUM BROMIDE 10 MG/ML (PF) SYRINGE
PREFILLED_SYRINGE | INTRAVENOUS | Status: DC | PRN
  Administered 2018-10-20: 50 mg via INTRAVENOUS
  Administered 2018-10-20 (×2): 20 mg via INTRAVENOUS
  Administered 2018-10-20: 10 mg via INTRAVENOUS

## 2018-10-20 MED ORDER — ALBUTEROL SULFATE HFA 108 (90 BASE) MCG/ACT IN AERS
INHALATION_SPRAY | RESPIRATORY_TRACT | Status: AC
  Filled 2018-10-20: qty 6.7

## 2018-10-20 MED ORDER — ACETAMINOPHEN 325 MG PO TABS
650.0000 mg | ORAL_TABLET | ORAL | Status: DC | PRN
  Administered 2018-10-21 – 2018-10-22 (×4): 650 mg via ORAL
  Filled 2018-10-20 (×4): qty 2

## 2018-10-20 MED ORDER — CEFAZOLIN SODIUM-DEXTROSE 2-4 GM/100ML-% IV SOLN
2.0000 g | INTRAVENOUS | Status: AC
  Administered 2018-10-20: 2 g via INTRAVENOUS
  Filled 2018-10-20: qty 100

## 2018-10-20 MED ORDER — PROPOFOL 10 MG/ML IV BOLUS
INTRAVENOUS | Status: AC
  Filled 2018-10-20: qty 20

## 2018-10-20 MED ORDER — SODIUM CHLORIDE 0.9% FLUSH
3.0000 mL | Freq: Two times a day (BID) | INTRAVENOUS | Status: DC
  Administered 2018-10-20 – 2018-10-21 (×3): 3 mL via INTRAVENOUS

## 2018-10-20 MED ORDER — SUGAMMADEX SODIUM 200 MG/2ML IV SOLN
INTRAVENOUS | Status: DC | PRN
  Administered 2018-10-20: 125 mg via INTRAVENOUS

## 2018-10-20 MED ORDER — THROMBIN 5000 UNITS EX SOLR
CUTANEOUS | Status: AC
  Filled 2018-10-20: qty 5000

## 2018-10-20 MED ORDER — OXYCODONE HCL 5 MG PO TABS
5.0000 mg | ORAL_TABLET | ORAL | Status: DC | PRN
  Administered 2018-10-20 – 2018-10-22 (×12): 5 mg via ORAL
  Filled 2018-10-20 (×12): qty 1

## 2018-10-20 MED ORDER — 0.9 % SODIUM CHLORIDE (POUR BTL) OPTIME
TOPICAL | Status: DC | PRN
  Administered 2018-10-20: 1000 mL

## 2018-10-20 MED ORDER — DEXAMETHASONE SODIUM PHOSPHATE 10 MG/ML IJ SOLN
INTRAMUSCULAR | Status: DC | PRN
  Administered 2018-10-20: 10 mg via INTRAVENOUS

## 2018-10-20 MED ORDER — ROCURONIUM BROMIDE 50 MG/5ML IV SOSY
PREFILLED_SYRINGE | INTRAVENOUS | Status: AC
  Filled 2018-10-20: qty 15

## 2018-10-20 MED ORDER — MONTELUKAST SODIUM 10 MG PO TABS
10.0000 mg | ORAL_TABLET | Freq: Every day | ORAL | Status: DC
  Administered 2018-10-21 – 2018-10-22 (×2): 10 mg via ORAL
  Filled 2018-10-20 (×2): qty 1

## 2018-10-20 MED ORDER — BACITRACIN ZINC 500 UNIT/GM EX OINT
TOPICAL_OINTMENT | CUTANEOUS | Status: AC
  Filled 2018-10-20: qty 28.35

## 2018-10-20 MED ORDER — PANTOPRAZOLE SODIUM 40 MG PO TBEC
40.0000 mg | DELAYED_RELEASE_TABLET | Freq: Every day | ORAL | Status: DC
  Administered 2018-10-21 – 2018-10-22 (×2): 40 mg via ORAL
  Filled 2018-10-20 (×2): qty 1

## 2018-10-20 MED ORDER — PROPOFOL 1000 MG/100ML IV EMUL
INTRAVENOUS | Status: AC
  Filled 2018-10-20: qty 100

## 2018-10-20 MED ORDER — ONDANSETRON HCL 4 MG/2ML IJ SOLN
4.0000 mg | Freq: Four times a day (QID) | INTRAMUSCULAR | Status: DC | PRN
  Administered 2018-10-20: 4 mg via INTRAVENOUS
  Filled 2018-10-20: qty 2

## 2018-10-20 MED ORDER — BUPIVACAINE-EPINEPHRINE (PF) 0.25% -1:200000 IJ SOLN
INTRAMUSCULAR | Status: AC
  Filled 2018-10-20: qty 30

## 2018-10-20 MED ORDER — SIMVASTATIN 20 MG PO TABS
20.0000 mg | ORAL_TABLET | Freq: Every day | ORAL | Status: DC
  Administered 2018-10-20 – 2018-10-21 (×2): 20 mg via ORAL
  Filled 2018-10-20 (×2): qty 1

## 2018-10-20 MED ORDER — LACTATED RINGERS IV SOLN
INTRAVENOUS | Status: DC | PRN
  Administered 2018-10-20 (×2): via INTRAVENOUS

## 2018-10-20 MED ORDER — OXYCODONE HCL 5 MG PO TABS
10.0000 mg | ORAL_TABLET | ORAL | Status: DC | PRN
  Administered 2018-10-20: 5 mg via ORAL
  Filled 2018-10-20: qty 2

## 2018-10-20 MED ORDER — SODIUM CHLORIDE 0.9 % IV SOLN
INTRAVENOUS | Status: DC | PRN
  Administered 2018-10-20: 07:00:00

## 2018-10-20 MED ORDER — SODIUM CHLORIDE 0.9% FLUSH: 3.0000 mL | INTRAVENOUS | Status: DC | PRN

## 2018-10-20 MED ORDER — FENTANYL CITRATE (PF) 100 MCG/2ML IJ SOLN
25.0000 ug | INTRAMUSCULAR | Status: DC | PRN
  Administered 2018-10-20: 50 ug via INTRAVENOUS
  Administered 2018-10-20 (×2): 25 ug via INTRAVENOUS

## 2018-10-20 MED ORDER — PHENYLEPHRINE 40 MCG/ML (10ML) SYRINGE FOR IV PUSH (FOR BLOOD PRESSURE SUPPORT)
PREFILLED_SYRINGE | INTRAVENOUS | Status: DC | PRN
  Administered 2018-10-20 (×2): 80 ug via INTRAVENOUS

## 2018-10-20 MED ORDER — SODIUM CHLORIDE 0.9 % IV SOLN
INTRAVENOUS | Status: DC | PRN
  Administered 2018-10-20: 25 ug/min via INTRAVENOUS

## 2018-10-20 MED ORDER — CEFAZOLIN SODIUM-DEXTROSE 2-4 GM/100ML-% IV SOLN
2.0000 g | Freq: Three times a day (TID) | INTRAVENOUS | Status: AC
  Administered 2018-10-20 (×2): 2 g via INTRAVENOUS
  Filled 2018-10-20 (×2): qty 100

## 2018-10-20 SURGICAL SUPPLY — 72 items
BAG DECANTER FOR FLEXI CONT (MISCELLANEOUS) ×2 IMPLANT
BASKET BONE COLLECTION (BASKET) ×2 IMPLANT
BENZOIN TINCTURE PRP APPL 2/3 (GAUZE/BANDAGES/DRESSINGS) ×2 IMPLANT
BLADE 15 SAFETY STRL DISP (BLADE) ×2 IMPLANT
BLADE CLIPPER SURG (BLADE) IMPLANT
BUR MATCHSTICK NEURO 3.0 LAGG (BURR) ×2 IMPLANT
BUR PRECISION FLUTE 6.0 (BURR) ×2 IMPLANT
CAGE ALTERA 10X31X9-13 15D (Cage) ×4 IMPLANT
CANISTER SUCT 3000ML PPV (MISCELLANEOUS) ×2 IMPLANT
CAP REVERE LOCKING (Cap) ×12 IMPLANT
CARTRIDGE OIL MAESTRO DRILL (MISCELLANEOUS) ×1 IMPLANT
CONT SPEC 4OZ CLIKSEAL STRL BL (MISCELLANEOUS) ×2 IMPLANT
COVER BACK TABLE 60X90IN (DRAPES) ×2 IMPLANT
COVER WAND RF STERILE (DRAPES) IMPLANT
DECANTER SPIKE VIAL GLASS SM (MISCELLANEOUS) ×2 IMPLANT
DERMABOND ADVANCED (GAUZE/BANDAGES/DRESSINGS)
DERMABOND ADVANCED .7 DNX12 (GAUZE/BANDAGES/DRESSINGS) IMPLANT
DIFFUSER DRILL AIR PNEUMATIC (MISCELLANEOUS) ×2 IMPLANT
DRAPE C-ARM 42X72 X-RAY (DRAPES) ×4 IMPLANT
DRAPE HALF SHEET 40X57 (DRAPES) ×4 IMPLANT
DRAPE LAPAROTOMY 100X72X124 (DRAPES) ×2 IMPLANT
DRAPE SURG 17X23 STRL (DRAPES) ×8 IMPLANT
DRSG OPSITE POSTOP 4X6 (GAUZE/BANDAGES/DRESSINGS) ×2 IMPLANT
ELECT BLADE 4.0 EZ CLEAN MEGAD (MISCELLANEOUS) ×2
ELECT REM PT RETURN 9FT ADLT (ELECTROSURGICAL) ×2
ELECTRODE BLDE 4.0 EZ CLN MEGD (MISCELLANEOUS) ×1 IMPLANT
ELECTRODE REM PT RTRN 9FT ADLT (ELECTROSURGICAL) ×1 IMPLANT
GAUZE 4X4 16PLY RFD (DISPOSABLE) ×2 IMPLANT
GAUZE SPONGE 4X4 12PLY STRL (GAUZE/BANDAGES/DRESSINGS) ×2 IMPLANT
GLOVE BIO SURGEON STRL SZ 6.5 (GLOVE) ×2 IMPLANT
GLOVE BIO SURGEON STRL SZ7 (GLOVE) ×2 IMPLANT
GLOVE BIO SURGEON STRL SZ8 (GLOVE) ×4 IMPLANT
GLOVE BIO SURGEON STRL SZ8.5 (GLOVE) ×4 IMPLANT
GLOVE BIOGEL PI IND STRL 8 (GLOVE) ×1 IMPLANT
GLOVE BIOGEL PI INDICATOR 8 (GLOVE) ×1
GLOVE ECLIPSE 7.5 STRL STRAW (GLOVE) ×12 IMPLANT
GLOVE EXAM NITRILE XL STR (GLOVE) IMPLANT
GLOVE SURG SS PI 7.0 STRL IVOR (GLOVE) ×6 IMPLANT
GLOVE SURG SS PI 7.5 STRL IVOR (GLOVE) ×2 IMPLANT
GLOVE SURG SS PI 8.0 STRL IVOR (GLOVE) ×2 IMPLANT
GOWN STRL REUS W/ TWL LRG LVL3 (GOWN DISPOSABLE) ×1 IMPLANT
GOWN STRL REUS W/ TWL XL LVL3 (GOWN DISPOSABLE) ×5 IMPLANT
GOWN STRL REUS W/TWL 2XL LVL3 (GOWN DISPOSABLE) IMPLANT
GOWN STRL REUS W/TWL LRG LVL3 (GOWN DISPOSABLE) ×1
GOWN STRL REUS W/TWL XL LVL3 (GOWN DISPOSABLE) ×5
HEMOSTAT POWDER KIT SURGIFOAM (HEMOSTASIS) ×4 IMPLANT
KIT BASIN OR (CUSTOM PROCEDURE TRAY) ×2 IMPLANT
KIT TURNOVER KIT B (KITS) ×2 IMPLANT
MILL MEDIUM DISP (BLADE) IMPLANT
NEEDLE HYPO 21X1.5 SAFETY (NEEDLE) ×2 IMPLANT
NEEDLE HYPO 22GX1.5 SAFETY (NEEDLE) ×2 IMPLANT
NS IRRIG 1000ML POUR BTL (IV SOLUTION) ×2 IMPLANT
OIL CARTRIDGE MAESTRO DRILL (MISCELLANEOUS) ×2
PACK LAMINECTOMY NEURO (CUSTOM PROCEDURE TRAY) ×2 IMPLANT
PAD ARMBOARD 7.5X6 YLW CONV (MISCELLANEOUS) ×6 IMPLANT
PATTIES SURGICAL .5 X1 (DISPOSABLE) IMPLANT
PATTIES SURGICAL 1X1 (DISPOSABLE) ×2 IMPLANT
ROD REVERE CURVED 65MM (Rod) ×4 IMPLANT
SCREW 7.5X50MM (Screw) ×12 IMPLANT
SPONGE LAP 4X18 RFD (DISPOSABLE) ×2 IMPLANT
SPONGE NEURO XRAY DETECT 1X3 (DISPOSABLE) IMPLANT
SPONGE SURGIFOAM ABS GEL 100 (HEMOSTASIS) IMPLANT
STRIP BIOACTIVE 20CC 25X100X8 (Miscellaneous) ×2 IMPLANT
STRIP CLOSURE SKIN 1/2X4 (GAUZE/BANDAGES/DRESSINGS) ×2 IMPLANT
SUT VIC AB 1 CT1 18XBRD ANBCTR (SUTURE) ×2 IMPLANT
SUT VIC AB 1 CT1 8-18 (SUTURE) ×2
SUT VIC AB 2-0 CP2 18 (SUTURE) ×4 IMPLANT
SYR 20CC LL (SYRINGE) ×2 IMPLANT
TOWEL GREEN STERILE (TOWEL DISPOSABLE) ×2 IMPLANT
TOWEL GREEN STERILE FF (TOWEL DISPOSABLE) ×2 IMPLANT
TRAY FOLEY MTR SLVR 16FR STAT (SET/KITS/TRAYS/PACK) ×2 IMPLANT
WATER STERILE IRR 1000ML POUR (IV SOLUTION) ×2 IMPLANT

## 2018-10-20 NOTE — H&P (Signed)
Subjective: The patient is a 72 year old white female who has complained of back and leg pain consistent with neurogenic claudication.  She has failed medical management.  She was worked up with lumbar x-rays and lumbar MRI which demonstrated spinal stenosis and a spondylolisthesis.  I discussed the various treatment options with her.  She has decided to proceed with surgery.  Past Medical History:  Diagnosis Date  . Allergic rhinitis   . Allergy   . Anxiety   . Arthritis   . Complication of anesthesia   . Hyperlipemia   . Hypertension   . PONV (postoperative nausea and vomiting)     Past Surgical History:  Procedure Laterality Date  . COLONOSCOPY    . ESOPHAGEAL MANOMETRY N/A 12/21/2015   Procedure: ESOPHAGEAL MANOMETRY (EM);  Surgeon: Josefine Class, MD;  Location: [facility] ENDOSCOPY;  Service: Endoscopy;  Laterality: N/A;  . ESOPHAGOGASTRODUODENOSCOPY (EGD) WITH PROPOFOL N/A 11/04/2015   Procedure: ESOPHAGOGASTRODUODENOSCOPY (EGD) WITH PROPOFOL;  Surgeon: Hulen Luster, MD;  Location: [facility] ENDOSCOPY;  Service: Endoscopy;  Laterality: N/A;  . TUBAL LIGATION      Allergies  Allergen Reactions  . Flagyl [Metronidazole] Nausea And Vomiting  . Biaxin [Clarithromycin]     Reaction: stomach cramps  . Erythromycin     Reaction: stomach cramps    Social History   Tobacco Use  . Smoking status: Former Smoker    Types: Cigarettes  . Smokeless tobacco: Never Used  Substance Use Topics  . Alcohol use: No    Family History  Problem Relation Age of Onset  . Breast cancer Neg Hx    Prior to Admission medications   Medication Sig Start Date End Date Taking? Authorizing Provider  acetaminophen (TYLENOL) 500 MG tablet Take 1,000 mg by mouth daily as needed for moderate pain or headache.   Yes [provider]  aspirin EC 81 MG tablet Take 81 mg by mouth daily.   Yes [provider]  Calcium Citrate-Vitamin D (CALCIUM + D PO) Take 1 tablet by mouth daily.   Yes [provider]  Carboxymethylcellul-Glycerin (LUBRICATING EYE DROPS OP) Place 1 drop into both eyes daily as needed (dry eyes).   Yes [provider]  DULoxetine (CYMBALTA) 60 MG capsule Take 60 mg by mouth 2 (two) times daily.   Yes [provider]  fluticasone (FLONASE) 50 MCG/ACT nasal spray Place 2 sprays into both nostrils daily as needed for allergies.    Yes [provider]  Lidocaine 4 % PTCH Apply 1 patch topically daily as needed (pain).   Yes [provider]  LORazepam (ATIVAN) 0.5 MG tablet Take 0.5 mg by mouth at bedtime.    Yes [provider]  losartan-hydrochlorothiazide (HYZAAR) 50-12.5 MG tablet Take 1 tablet by mouth daily.   Yes [provider]  meloxicam (MOBIC) 7.5 MG tablet Take 7.5 mg by mouth See admin instructions. Take 7.5 mg daily, may take a second 7.5 mg dose as need for pain   Yes [provider]  Menthol, Topical Analgesic, (ASPERCREME MAX ROLL-ON EX) Apply 1 application topically daily as needed (back pain).   Yes [provider]  Misc Natural Products (OSTEO BI-FLEX ADV TRIPLE ST) TABS Take 1 tablet by mouth daily.   Yes [provider]  montelukast (SINGULAIR) 10 MG tablet Take 10 mg by mouth daily.   Yes [provider]  pantoprazole (PROTONIX) 40 MG tablet Take 1 tablet (40 mg total) by mouth daily. 11/04/15  Yes Abel Presto  J, MD  Probiotic CAPS Take 1 capsule by mouth daily.   Yes [provider]  simvastatin (ZOCOR) 20 MG tablet Take 20 mg by mouth daily at 6 PM.   Yes [provider]  traMADol (ULTRAM) 50 MG tablet Take 50 mg by mouth See admin instructions. Take 50 mg daily, may take a second 50 mg dose as needed for pain   Yes [provider]  bismuth subsalicylate (PEPTO BISMOL) 262 MG/15ML suspension Take 30 mLs by mouth daily as needed for diarrhea or loose stools.    [provider]     Review of Systems  Positive ROS: As  above  All other systems have been reviewed and were otherwise negative with the exception of those mentioned in the HPI and as above.  Objective: Vital signs in last 24 hours: Temp:  [98.1 F (36.7 C)] 98.1 F (36.7 C) (12/09 0544) Pulse Rate:  [74] 74 (12/09 0544) Resp:  [20] 20 (12/09 0544) BP: (156)/(64) 156/64 (12/09 0544) SpO2:  [98 %] 98 % (12/09 0544) Weight:  [59 kg] 59 kg (12/09 0544) Estimated body mass index is 23.03 kg/m as calculated from the following:   Height as of this encounter: 5\' 3"  (1.6 m).   Weight as of this encounter: 59 kg.   General Appearance: Alert Head: Normocephalic, without obvious abnormality, atraumatic Eyes: PERRL, conjunctiva/corneas clear, EOM's intact,    Ears: Normal  Throat: Normal  Neck: Supple, Back: unremarkable Lungs: Clear to auscultation bilaterally, respirations unlabored Heart: Regular rate and rhythm, no murmur, rub or gallop Abdomen: Soft, non-tender Extremities: Extremities normal, atraumatic, no cyanosis or edema Skin: unremarkable  NEUROLOGIC:   Mental status: alert and oriented,Motor Exam - grossly normal Sensory Exam - grossly normal Reflexes:  Coordination - grossly normal Gait - grossly normal Balance - grossly normal Cranial Nerves: I: smell Not tested  II: visual acuity  OS: Normal  OD: Normal   II: visual fields Full to confrontation  II: pupils Equal, round, reactive to light  III,VII: ptosis None  III,IV,VI: extraocular muscles  Full ROM  V: mastication Normal  V: facial light touch sensation  Normal  V,VII: corneal reflex  Present  VII: facial muscle function - upper  Normal  VII: facial muscle function - lower Normal  VIII: hearing Not tested  IX: soft palate elevation  Normal  IX,X: gag reflex Present  XI: trapezius strength  5/5  XI: sternocleidomastoid strength 5/5  XI: neck flexion strength  5/5  XII: tongue strength  Normal    Data Review Lab Results  Component Value Date   WBC 5.4  10/13/2018   HGB 11.0 (L) 10/13/2018   HCT 35.4 (L) 10/13/2018   MCV 98.6 10/13/2018   PLT 322 10/13/2018   Lab Results  Component Value Date   NA 137 10/13/2018   K 3.5 10/13/2018   CL 99 10/13/2018   CO2 28 10/13/2018   BUN 18 10/13/2018   CREATININE 1.05 (H) 10/13/2018   GLUCOSE 111 (H) 10/13/2018   No results found for: INR, PROTIME  Assessment/Plan: L3-4 and L4-5 spondylolisthesis, spinal stenosis, lumbago, lumbar radiculopathy, neurogenic claudication: I have discussed the situation with the patient and reviewed her imaging studies with her.  We have discussed the various treatment options including surgery.  I have described the surgical treatment option of an L3-4 and L4-5 decompression, instrumentation and fusion.  I have shown her surgical models.  I have given her surgical pamphlet.  We have discussed the risks,  benefits, alternatives, expected postop course, and likelihood of achieving our goals with surgery.  I have answered all her questions.  She has decided to proceed with surgery.   Brittney Ramsey 10/20/2018 7:22 AM

## 2018-10-20 NOTE — Anesthesia Procedure Notes (Signed)
Procedure Name: Intubation Date/Time: 10/20/2018 7:37 AM Performed by: Gaylene Brooks, CRNA Pre-anesthesia Checklist: Patient identified, Emergency Drugs available, Suction available and Patient being monitored Patient Re-evaluated:Patient Re-evaluated prior to induction Oxygen Delivery Method: Circle System Utilized Preoxygenation: Pre-oxygenation with 100% oxygen Induction Type: IV induction Ventilation: Mask ventilation without difficulty Laryngoscope Size: Miller and 2 Grade View: Grade II Tube type: Oral Tube size: 7.0 mm Number of attempts: 1 Airway Equipment and Method: Stylet and Oral airway Placement Confirmation: ETT inserted through vocal cords under direct vision,  positive ETCO2 and breath sounds checked- equal and bilateral Secured at: 22 cm Tube secured with: Tape Dental Injury: Teeth and Oropharynx as per pre-operative assessment

## 2018-10-20 NOTE — Op Note (Signed)
Brief history:the patient is a 72 year old white female who has complained of back, buttock and leg pain consistent with neurogenic claudication. She has failed medical management. She was worked up with a lumbar MRI and lumbar x-rays which demonstrated an L3-4 and L4-5 spondylolisthesis and spinal stenosis. I discussed the various treatment options with the patient. She has weighed the risks, benefits, and alternatives surgery and decided to proceed with an L3-4 and L4-5 decompression, his rotation and fusion.  Preoperative diagnosis:L3-4 and L4-5 spondylolisthesis,Degenerative disc disease, spinal stenosis compressing  the L3, L4 and L5 nerve roots; lumbago; lumbar radiculopathy; neurogenic claudication  Postoperative diagnosis:the same  Procedure:bilateral L3-4 and L4-5Laminotomy/foraminotomies/medial facetectomy to decompress the bilateralL3, L4 and L5nerve roots(the work required to do this was in addition to the work required to do the posterior lumbar interbody fusion because of the patient's spinal stenosis, facet arthropathy. Etc. requiring a wide decompression of the nerve roots.);L3-4 and L4-5 transforaminal lumbar interbody fusion with local morselized autograft bone and Kinnex graft extender; insertion of interbody prosthesis atand L3-4 and L4-5(globus peek expandable interbody prosthesis); posteriorsegmentalinstrumentation fromL3 toL5with globus titanium pedicle screws and rods; posterior lateral arthrodesis atL3-4 and L4-5with local morselized autograft bone and Kinnex bone graft extender.  Surgeon: Dr. Earle Gell  Asst.:Megan Reinaldo Meeker nurse practitioner  Anesthesia: Gen. endotracheal  Estimated blood loss:300 mL  Drains: None  Complications: None  Description of procedure: The patient was brought to the operating room by the anesthesia team. General endotracheal anesthesia was induced. The patient was turned to the prone position on the Wilson frame. The patient's lumbosacral  region was then prepared with Betadine scrub and Betadine solution. Sterile drapes were applied.  I then injected the area to be incised with Marcaine with epinephrine solution. I then used the scalpel to make a linear midline incision over theL3-4 and L4-5interspace. I then used electrocautery to perform a bilateral subperiosteal dissection exposing the spinous process and lamina ofL3, L4 and L5. We then obtained intraoperative radiograph to confirm our location. We then inserted the Verstrac retractor to provide exposure.  I began the decompression by using the high speed drill to perform laminotomies atL3-4 and L4-5 bilaterally. We then used the Kerrison punches to widen the laminotomy and removed the ligamentum flavum atL3-4 and L4-5 bilaterally. We used the Kerrison punches to remove the medial facets atL3-4 and L4-5 bilaterally. We performed wide foraminotomies about the bilateralL3, L4 and L5nerve roots completing the decompression.  We now turned our attention to the posterior lumbar interbody fusion. I used a scalpel to incise the intervertebral disc atL3-4 and L4-5 bilaterally. I then performed a partial intervertebral discectomy atL3-4 and L4-5 bilaterallyusing the pituitary forceps. We prepared the vertebral endplates atL3-4 and Y7-7 bilaterallyfor the fusion by removing the soft tissues with the curettes. We then used the trial spacers to pick the appropriate sized interbody prosthesis. We prefilled his prosthesis with a combination of local morselized autograft bone that we obtained during the decompression as well as Kinnex bone graft extender. We inserted the prefilled prosthesis into the interspace atL3-4 and L4-5 from the left,, we then turned and expanded the prosthesis. There was a good snug fit of the prosthesis in the interspace. We then filled and the remainder of the intervertebral disc space with local morselized autograft bone and Kinnex. This completed the posterior lumbar interbody  arthrodesis.  We now turned attention to the instrumentation. Under fluoroscopic guidance we cannulated the bilateralL3, L4 and L5pedicles with the bone probe. We then removed the bone probe. We  then tapped the pedicle with a6.70millimeter tap. We then removed the tap. We probed inside the tapped pedicle with a ball probe to rule out cortical breaches. We then inserted a7.5 x 27millimeter pedicle screw into the L3, L4 and L5pedicles bilaterally under fluoroscopic guidance. We then palpated along the medial aspect of the pedicles to rule out cortical breaches. There were none. The nerve roots were not injured. We then connected the unilateral pedicle screws with a lordotic rod. We compressed the construct and secured the rod in place with the caps. We then tightened the caps appropriately. This completed the instrumentation fromL3-L5 bilaterally.  We now turned our attention to the posterior lateral arthrodesis atL3-4 and L4-5 bilaterally. We used the high-speed drill to decorticate the remainder of the facets, pars, transverse process atL3-4 and L4-5 bilaterally. We then applied a combination of local morselized autograft bone and Kinnex bone graft extender over these decorticated posterior lateral structures. This completed the posterior lateral arthrodesis.  We then obtained hemostasis using bipolar electrocautery. We irrigated the wound out with bacitracin solution. We inspected the thecal sac and nerve roots and noted they were well decompressed. We then removed the retractor. We placed vancomycin powder in the wound.  We injected Exparel . We reapproximated patient's thoracolumbar fascia with interrupted #1 Vicryl suture. We reapproximated patient's subcutaneous tissue with interrupted 2-0 Vicryl suture. The reapproximated patient's skin with Steri-Strips and benzoin. The wound was then coated with bacitracin ointment. A sterile dressing was applied. The drapes were removed. The patient was subsequently  returned to the supine position where they were extubated by the anesthesia team. He was then transported to the post anesthesia care unit in stable condition. All sponge instrument and needle counts were reportedly correct at the end of this case.

## 2018-10-20 NOTE — Transfer of Care (Signed)
Immediate Anesthesia Transfer of Care Note  Patient: Brittney Ramsey  Procedure(s) Performed: POSTERIOR LUMBAR INTERBODY FUSION, INTERBODY PROSTHESIS, POSTERIOR INSTRUMENTATION LUMBAR THREE- LUMBAR FOUR, LUMBAR FOUR- LUMBAR FIVE (N/A Spine Lumbar)  Patient Location: PACU  Anesthesia Type:General  Level of Consciousness: awake, alert , oriented and sedated  Airway & Oxygen Therapy: Patient Spontanous Breathing and Patient connected to nasal cannula oxygen  Post-op Assessment: Report given to RN, Post -op Vital signs reviewed and stable and Patient moving all extremities X 4  Post vital signs: Reviewed and stable  Last Vitals:  Vitals Value Taken Time  BP 170/135 10/20/2018 12:10 PM  Temp    Pulse 102 10/20/2018 12:10 PM  Resp 18 10/20/2018 12:10 PM  SpO2 100 % 10/20/2018 12:10 PM  Vitals shown include unvalidated device data.  Last Pain:  Vitals:   10/20/18 0622  TempSrc:   PainSc: 5          Complications: No apparent anesthesia complications

## 2018-10-20 NOTE — Anesthesia Postprocedure Evaluation (Signed)
Anesthesia Post Note  Patient: Brittney Ramsey  Procedure(s) Performed: POSTERIOR LUMBAR INTERBODY FUSION, INTERBODY PROSTHESIS, POSTERIOR INSTRUMENTATION LUMBAR THREE- LUMBAR FOUR, LUMBAR FOUR- LUMBAR FIVE (N/A Spine Lumbar)     Patient location during evaluation: PACU Anesthesia Type: General Level of consciousness: awake Pain management: pain level controlled Vital Signs Assessment: post-procedure vital signs reviewed and stable Respiratory status: spontaneous breathing Cardiovascular status: stable Postop Assessment: no apparent nausea or vomiting Anesthetic complications: no    Last Vitals:  Vitals:   10/20/18 1327 10/20/18 1354  BP: (!) 124/52 (!) 121/54  Pulse: 92 87  Resp: 13 16  Temp: (!) 36.3 C (!) 36.3 C  SpO2: 98% 97%    Last Pain:  Vitals:   10/20/18 1354  TempSrc: Oral  PainSc:                  [name]

## 2018-10-20 NOTE — Evaluation (Signed)
Physical Therapy Evaluation Patient Details Name: Brittney Ramsey MRN: [id number] DOB: 23-Oct-1946 Today's Date: 10/20/2018   History of Present Illness  72 year old white female who has complained of back and leg pain consistent with neurogenic claudication now s/p PLIF.  Clinical Impression  Patient seen for mobility assessment s/p spinal surgery. Mobilizing well but does require cues and assist at this time. Educated patient on precautions, mobility expectations, and safety. Feel patient would benefit from additional session to reinforce precautions and mobility prior to d/c home. Will see as indicated and progress as tolerated.     Follow Up Recommendations No PT follow up;Supervision/Assistance - 24 hour    Equipment Recommendations  None recommended by PT    Recommendations for Other Services       Precautions / Restrictions Precautions Precautions: Back Precaution Booklet Issued: Yes (comment) Precaution Comments: verbally reviewed with patient (son present) Required Braces or Orthoses: Spinal Brace Spinal Brace: Lumbar corset Restrictions Weight Bearing Restrictions: No      Mobility  Bed Mobility Overal bed mobility: Needs Assistance Bed Mobility: Rolling;Sit to Sidelying;Sidelying to Sit Rolling: Min guard Sidelying to sit: Min assist     Sit to sidelying: Min assist General bed mobility comments: Min assist to elevate trunk to upright and to elevate LEs upon return. Vcs for positioning and technique  Transfers Overall transfer level: Needs assistance Equipment used: Rolling walker (2 wheeled) Transfers: Sit to/from Stand Sit to Stand: Min guard         General transfer comment: min guard for safety and stability. VCs for hand placement and positioning  Ambulation/Gait Ambulation/Gait assistance: Min assist Gait Distance (Feet): 60 Feet Assistive device: Rolling walker (2 wheeled) Gait Pattern/deviations: Step-through pattern;Decreased stride  length;Drifts right/left Gait velocity: decreased Gait velocity interpretation: <1.31 ft/sec, indicative of household ambulator General Gait Details: patient required VCs for increased candence and hands on assist for balance and stability as well as pacing during ambulation.   Stairs            Wheelchair Mobility    Modified Rankin (Stroke Patients Only)       Balance Overall balance assessment: Mild deficits observed, not formally tested                                           Pertinent Vitals/Pain Pain Assessment: 0-10 Pain Score: 7  Pain Location: back Pain Descriptors / Indicators: Sore;Operative site guarding Pain Intervention(s): Monitored during session;Premedicated before session    [place] expects to be discharged to:: Private residence Living Arrangements: Spouse/significant other Available Help at Discharge: Family Type of Home: House Home Access: Ramped entrance     Home Layout: One level Home Equipment: Walker - standard;Cane - single point;Toilet riser      Prior Function Level of Independence: Independent with assistive device(s)               Hand Dominance   Dominant Hand: Right    Extremity/Trunk Assessment   Upper Extremity Assessment Upper Extremity Assessment: Generalized weakness    Lower Extremity Assessment Lower Extremity Assessment: Generalized weakness       Communication   Communication: HOH  Cognition Arousal/Alertness: Awake/alert Behavior During Therapy: WFL for tasks assessed/performed Overall Cognitive Status: Within Functional Limits for tasks assessed  General Comments      Exercises     Assessment/Plan    PT Assessment Patient needs continued PT services  PT Problem List Decreased strength;Decreased activity tolerance;Decreased balance;Decreased mobility;Decreased knowledge of precautions       PT  Treatment Interventions DME instruction;Gait training;Functional mobility training;Therapeutic activities;Therapeutic exercise;Balance training;Patient/family education    PT Goals (Current goals can be found in the Care Plan section)  Acute Rehab PT Goals Patient Stated Goal: to go home PT Goal Formulation: With patient/family Time For Goal Achievement: 11/03/18 Potential to Achieve Goals: Good    Frequency Min 5X/week   Barriers to discharge        Co-evaluation               AM-PAC PT "6 Clicks" Mobility  Outcome Measure Help needed turning from your back to your side while in a flat bed without using bedrails?: None Help needed moving from lying on your back to sitting on the side of a flat bed without using bedrails?: A Little Help needed moving to and from a bed to a chair (including a wheelchair)?: A Little Help needed standing up from a chair using your arms (e.g., wheelchair or bedside chair)?: A Little Help needed to walk in hospital room?: A Little Help needed climbing 3-5 steps with a railing? : A Lot 6 Click Score: 18    End of Session Equipment Utilized During Treatment: Gait belt;Back brace Activity Tolerance: Patient limited by fatigue Patient left: in bed;with call bell/phone within reach;with SCD's reapplied;with family/visitor present Nurse Communication: Mobility status PT Visit Diagnosis: Unsteadiness on feet (R26.81);Difficulty in walking, not elsewhere classified (R26.2)    Time: 6378-5885 PT Time Calculation (min) (ACUTE ONLY): 19 min   Charges:   PT Evaluation $PT Eval Moderate Complexity: 1 Mod          Alben Deeds, PT DPT  Board Certified Neurologic Specialist Acute Rehabilitation Services Pager [phone] Office [place] 10/20/2018, 5:02 PM

## 2018-10-21 LAB — BASIC METABOLIC PANEL
Anion gap: 10 (ref 5–15)
BUN: 11 mg/dL (ref 8–23)
CO2: 26 mmol/L (ref 22–32)
Calcium: 8.5 mg/dL — ABNORMAL LOW (ref 8.9–10.3)
Chloride: 101 mmol/L (ref 98–111)
Creatinine, Ser: 0.83 mg/dL (ref 0.44–1.00)
GFR calc Af Amer: >60 mL/min (ref >60)
Glucose, Bld: 110 mg/dL — ABNORMAL HIGH (ref 70–99)
Potassium: 3.4 mmol/L — ABNORMAL LOW (ref 3.5–5.1)
Sodium: 137 mmol/L (ref 135–145)

## 2018-10-21 LAB — CBC
HCT: 26 % — ABNORMAL LOW (ref 36.0–46.0)
Hemoglobin: 8.5 g/dL — ABNORMAL LOW (ref 12.0–15.0)
MCH: 31.3 pg (ref 26.0–34.0)
MCHC: 32.7 g/dL (ref 30.0–36.0)
MCV: 95.6 fL (ref 80.0–100.0)
PLATELETS: 266 10*3/uL (ref 150–400)
RBC: 2.72 MIL/uL — ABNORMAL LOW (ref 3.87–5.11)
RDW: 13.8 % (ref 11.5–15.5)
WBC: 7.5 10*3/uL (ref 4.0–10.5)
nRBC: 0 % (ref 0.0–0.2)

## 2018-10-21 MED ORDER — MAGNESIUM CITRATE PO SOLN
1.0000 | Freq: Once | ORAL | Status: AC
  Administered 2018-10-21: 1 via ORAL
  Filled 2018-10-21: qty 296

## 2018-10-21 MED FILL — Heparin Sodium (Porcine) Inj 1000 Unit/ML: INTRAMUSCULAR | Qty: 30 | Status: AC

## 2018-10-21 MED FILL — Sodium Chloride IV Soln 0.9%: INTRAVENOUS | Qty: 1000 | Status: AC

## 2018-10-21 NOTE — Evaluation (Signed)
Occupational Therapy Evaluation Patient Details Name: Brittney Ramsey MRN: [id number] DOB: Apr 07, 1946 Today's Date: 10/21/2018    History of Present Illness Pt is a 72 year old female with c/o back and leg pain consistent with neurogenic claudication now s/p PLIF. PMH HTN, HLD, arthritis, anxiety.   Clinical Impression   PTA, pt was living with her husband and was independent and acting as primary caregiver for her husband; pt's daughter and son plan on providing support at dc. Currently, pt requires Supervision-Min A for UB ADLs, Min Guard A for LB ADLs with AE, and Min Guard A for functional mobility using RW. Provided education and handout on back precautions, brace management, grooming, LB ADLs with AE, toileting, and shower transfer with shower seat; pt demonstrated understanding. Answered all pt questions. Recommend dc home once medically stable per physician. All acute OT needs met and will sign off. Thank you.     Follow Up Recommendations  No OT follow up;Supervision/Assistance - 24 hour    Equipment Recommendations  None recommended by OT    Recommendations for Other Services PT consult     Precautions / Restrictions Precautions Precautions: Back;Fall Precaution Booklet Issued: Yes (comment) Precaution Comments: Educating pt on no BLT and reviewed throughout session Required Braces or Orthoses: Spinal Brace Spinal Brace: Lumbar corset Restrictions Weight Bearing Restrictions: No      Mobility Bed Mobility Overal bed mobility: Needs Assistance Bed Mobility: Rolling;Sidelying to Sit Rolling: Min guard Sidelying to sit: Min guard       General bed mobility comments: Min Guard A for safety and cues for each step of sequence  Transfers Overall transfer level: Needs assistance Equipment used: Rolling walker (2 wheeled) Transfers: Sit to/from Stand Sit to Stand: Min guard         General transfer comment: min guard for safety, VC for hand placement      Balance Overall balance assessment: Needs assistance Sitting-balance support: No upper extremity supported;Feet supported Sitting balance-Leahy Scale: Good     Standing balance support: Bilateral upper extremity supported;During functional activity Standing balance-Leahy Scale: Fair Standing balance comment: can navigate uneven ground with RW, needs to slow down significantly and watch feet/RW                           ADL either performed or assessed with clinical judgement   ADL Overall ADL's : Needs assistance/impaired Eating/Feeding: Independent;Sitting   Grooming: Supervision/safety;Set up;Standing Grooming Details (indicate cue type and reason): Educating on compensatory techniques for grooming Upper Body Bathing: Set up;Supervision/ safety;Sitting   Lower Body Bathing: Min guard;With adaptive equipment;Sit to/from stand   Upper Body Dressing : Minimal assistance Upper Body Dressing Details (indicate cue type and reason): Able to don back brace with Min A for positioning. Pt and family vertablized understanding Lower Body Dressing: Min guard;Sit to/from stand;With adaptive equipment Lower Body Dressing Details (indicate cue type and reason): Providing education on use of AE for LB dressing. Pt demonstrating understanding by donning/doffing underwear and socks. Pt requiring Min Guard A for safety. Pt's daughter reporting she plans to assist pt with dressing as well. Toilet Transfer: Min guard;RW;Ambulation(simulated in room)     Toileting - Clothing Manipulation Details (indicate cue type and reason): Educated pt on compensatory techniques for toilet hygiene. Pt demonstrated understanding Tub/ Shower Transfer: Walk-in shower;Min guard;Ambulation;Shower Technical sales engineer Details (indicate cue type and reason): Educating pt on safe shower transfer and she performed with [employer] A for safety Functional  mobility during ADLs: Min guard;Rolling  walker General ADL Comments: Pt presenting with good motivation and performance post surgery. Reporting increased soreness this morning     Vision         Perception     Praxis      Pertinent Vitals/Pain Pain Assessment: 0-10 Pain Score: 4  Pain Location: incision site/back Pain Descriptors / Indicators: Sore;Operative site guarding Pain Intervention(s): Monitored during session;Limited activity within patient's tolerance;Repositioned     Hand Dominance Right   Extremity/Trunk Assessment Upper Extremity Assessment Upper Extremity Assessment: Generalized weakness   Lower Extremity Assessment Lower Extremity Assessment: Generalized weakness   Cervical / Trunk Assessment Cervical / Trunk Assessment: Other exceptions Cervical / Trunk Exceptions: s/p  L3-4 and L4-5 PLIF   Communication Communication Communication: HOH   Cognition Arousal/Alertness: Awake/alert Behavior During Therapy: WFL for tasks assessed/performed Overall Cognitive Status: Within Functional Limits for tasks assessed                                     General Comments  Daughter present throughout session    Exercises     Shoulder Instructions      Home Living Family/patient expects to be discharged to:: Private residence Living Arrangements: Spouse/significant other Available Help at Discharge: Family Type of Home: House Home Access: Ramped entrance     Home Layout: One level     Bathroom Shower/Tub: Walk-in Corporate treasurer Toilet: Handicapped height     Home Equipment: Environmental consultant - standard;Cane - single point;Toilet riser          Prior Functioning/Environment Level of Independence: Independent with assistive device(s)        Comments: ADLs, IADLs, and is primary caregiver for her husband        OT Problem List: Decreased strength;Decreased activity tolerance;Decreased range of motion;Impaired balance (sitting and/or standing);Decreased knowledge of use of  DME or AE;Decreased knowledge of precautions;Pain      OT Treatment/Interventions:      OT Goals(Current goals can be found in the care plan section) Acute Rehab OT Goals Patient Stated Goal: to go home OT Goal Formulation: All assessment and education complete, DC therapy  OT Frequency:     Barriers to D/C:            Co-evaluation              AM-PAC OT "6 Clicks" Daily Activity     Outcome Measure Help from another person eating meals?: None Help from another person taking care of personal grooming?: None Help from another person toileting, which includes using toliet, bedpan, or urinal?: A Little Help from another person bathing (including washing, rinsing, drying)?: A Little Help from another person to put on and taking off regular upper body clothing?: A Little Help from another person to put on and taking off regular lower body clothing?: A Little 6 Click Score: 20   End of Session Equipment Utilized During Treatment: Gait belt;Rolling walker;Back brace Nurse Communication: Mobility status;Precautions  Activity Tolerance: Patient tolerated treatment well Patient left: (in hallway with PT)  OT Visit Diagnosis: Unsteadiness on feet (R26.81);Other abnormalities of gait and mobility (R26.89);Muscle weakness (generalized) (M62.81);Pain Pain - part of body: (Back)                Time: 5750-5183 OT Time Calculation (min): 25 min Charges:  OT General Charges $OT Visit: 1 Visit OT Evaluation $OT Eval Low  Complexity: 1 Low OT Treatments $Self Care/Home Management : 8-22 mins  Amayrani Bennick MSOT, OTR/L Acute Rehab Pager: [phone] Office: [place] 10/21/2018, 9:05 AM

## 2018-10-21 NOTE — Progress Notes (Signed)
Subjective:    The patient is alert and pleasant.  She looks well.  Her back is appropriately sore.  Her daughter is at the bedside.  Objective: Vital signs in last 24 hours: Temp:  [97.3 F (36.3 C)-98.4 F (36.9 C)] 98.2 F (36.8 C) (12/10 0717) Pulse Rate:  [76-107] 76 (12/10 0717) Resp:  [12-33] 16 (12/10 0717) BP: (118-170)/(52-135) 130/55 (12/10 0717) SpO2:  [93 %-100 %] 98 % (12/10 0717) Estimated body mass index is 23.03 kg/m as calculated from the following:   Height as of this encounter: 5\' 3"  (1.6 m).   Weight as of this encounter: 59 kg.   Intake/Output from previous day: 12/09 0701 - 12/10 0700 In: 1800 [I.V.:1500] Out: 820 [Urine:520; Blood:300] Intake/Output this shift: No intake/output data recorded.  Physical exam   The patient is alert and pleasant.  Her dressing is clean and dry.  Her lower extremity strength is normal.  Lab Results: Recent Labs   10/21/18 0614 WBC 7.5 HGB 8.5* HCT 26.0* PLT 266  BMET Recent Labs   10/21/18 0614 NA 137 K 3.4* CL 101 CO2 26 GLUCOSE 110* BUN 11 CREATININE 0.83 CALCIUM 8.5*   Studies/Results: Dg Lumbar Spine 2-3 Views  Result Date: 10/20/2018 CLINICAL DATA:  Intraoperative imaging for L3-5 laminectomy and fusion. EXAM: LUMBAR SPINE - 2-3 VIEW; DG C-ARM 61-120 MIN COMPARISON:  MRI lumbar spine 08/10/2018. FINDINGS: Two fluoroscopic intraoperative spot views demonstrate pedicle screws at L3, L4 and L5 with interbody spacers in place at L3-4 and L4-5. Anterolisthesis at these levels seen on the prior examination has been partially reduced. No acute abnormality is identified. IMPRESSION: L3-5 laminectomy and fusion and process. Electronically Signed   By: Inge Rise M.D.   On: 10/20/2018 11:52   Dg Lumbar Spine 1 View  Result Date: 10/20/2018 CLINICAL DATA:  Back surgery. EXAM: LUMBAR SPINE - 1 VIEW COMPARISON:  MRI 08/10/2018 FINDINGS: Anterolisthesis L4-5 and L5-S1 as noted on prior MRI Surgical instrument  between the spinous processes of L4 and L5. Sponge in the operative field. Metal anchor is seen overlying the spinous process of L3. IMPRESSION: Surgical instrument at the L4-5 level. Electronically Signed   By: Franchot Gallo M.D.   On: 10/20/2018 09:39   Dg C-arm 1-60 Min  Result Date: 10/20/2018 CLINICAL DATA:  Intraoperative imaging for L3-5 laminectomy and fusion. EXAM: LUMBAR SPINE - 2-3 VIEW; DG C-ARM 61-120 MIN COMPARISON:  MRI lumbar spine 08/10/2018. FINDINGS: Two fluoroscopic intraoperative spot views demonstrate pedicle screws at L3, L4 and L5 with interbody spacers in place at L3-4 and L4-5. Anterolisthesis at these levels seen on the prior examination has been partially reduced. No acute abnormality is identified. IMPRESSION: L3-5 laminectomy and fusion and process. Electronically Signed   By: Inge Rise M.D.   On: 10/20/2018 11:52    Assessment/Plan:   The patient is doing well.  She is being mobilized with PT and OT.  She may go home later today.  I gave her discharge instructions and answered all their questions.  LOS: 1 day     Ophelia Charter 10/21/2018, 8:51 AM

## 2018-10-21 NOTE — Progress Notes (Signed)
Physical Therapy Treatment and Discharge Patient Details Name: Brittney Ramsey MRN: [id number] DOB: 03/06/1946 Today's Date: 10/21/2018    History of Present Illness Pt is a 72 year old female with c/o back and leg pain consistent with neurogenic claudication now s/p PLIF. PMH HTN, HLD, arthritis, anxiety.    PT Comments    Patient has met PT goals and has no further acute PT needs identified. Pt amb 200 feet with supervision and RW for safety, occasional drifting to R which pt self corrected. Pt educated on car transfers, walking program and reviewed back precautions. All education has been completed and the patient has no further questions. No PT follow up or equipment needs. PT is signing off. Thank you for this referral.   Follow Up Recommendations  No PT follow up;Supervision/Assistance - 24 hour     Equipment Recommendations  None recommended by PT    Recommendations for Other Services       Precautions / Restrictions Precautions Precautions: Back;Fall Precaution Comments: verbally reviewed with pt Required Braces or Orthoses: Spinal Brace Spinal Brace: Lumbar corset Restrictions Weight Bearing Restrictions: No    Mobility  Bed Mobility               General bed mobility comments: presented in standing, hand off from OT  Transfers Overall transfer level: Needs assistance Equipment used: Rolling walker (2 wheeled) Transfers: Sit to/from Stand Sit to Stand: Supervision         General transfer comment: supervision for safety, VC for hand placement and to avoid twisting to look at chair  Ambulation/Gait Ambulation/Gait assistance: Supervision Gait Distance (Feet): 200 Feet Assistive device: Rolling walker (2 wheeled) Gait Pattern/deviations: Step-through pattern;Decreased stride length;Drifts right/left Gait velocity: decreased Gait velocity interpretation: <1.8 ft/sec, indicate of risk for recurrent falls General Gait Details: Pt drifts R occasionally  but self corrects, does have inconsistencies in pacing but is aware of deviations. Min VC to stay close to RW. Supervision for safety.   Stairs             Wheelchair Mobility    Modified Rankin (Stroke Patients Only)       Balance Overall balance assessment: Needs assistance         Standing balance support: Bilateral upper extremity supported;During functional activity Standing balance-Leahy Scale: Fair Standing balance comment: can navigate uneven ground with RW, needs to slow down significantly and watch feet/RW                            Cognition Arousal/Alertness: Awake/alert Behavior During Therapy: WFL for tasks assessed/performed Overall Cognitive Status: Within Functional Limits for tasks assessed                                        Exercises      General Comments General comments (skin integrity, edema, etc.): Daughter present for end of session      Pertinent Vitals/Pain Pain Assessment: 0-10 Pain Score: 4  Pain Location: incision site/back Pain Descriptors / Indicators: Sore;Operative site guarding Pain Intervention(s): Monitored during session    Home Living Family/patient expects to be discharged to:: Private residence Living Arrangements: Spouse/significant other Available Help at Discharge: Family Type of Home: House Home Access: Ramped entrance   Home Layout: One level Home Equipment: Walker - standard;Cane - single point;Toilet riser      Prior Function  PT Goals (current goals can now be found in the care plan section) Acute Rehab PT Goals Patient Stated Goal: to go home PT Goal Formulation: With patient/family Time For Goal Achievement: 11/03/18 Potential to Achieve Goals: Good Progress towards PT goals: Progressing toward goals    Frequency           PT Plan Frequency needs to be updated    Co-evaluation              AM-PAC PT "6 Clicks" Mobility   Outcome  Measure  Help needed turning from your back to your side while in a flat bed without using bedrails?: None Help needed moving from lying on your back to sitting on the side of a flat bed without using bedrails?: A Little Help needed moving to and from a bed to a chair (including a wheelchair)?: None Help needed standing up from a chair using your arms (e.g., wheelchair or bedside chair)?: A Little Help needed to walk in hospital room?: A Little Help needed climbing 3-5 steps with a railing? : A Lot 6 Click Score: 19    End of Session Equipment Utilized During Treatment: Gait belt;Back brace Activity Tolerance: Patient tolerated treatment well Patient left: in chair;with family/visitor present;with call bell/phone within reach Nurse Communication: Mobility status PT Visit Diagnosis: Unsteadiness on feet (R26.81);Difficulty in walking, not elsewhere classified (R26.2)     Time: 9371-6967 PT Time Calculation (min) (ACUTE ONLY): 12 min  Charges:  $Gait Training: 8-22 mins                     Gilda Crease, SPT Acute Rehab Services [place] 10/21/2018, 8:36 AM

## 2018-10-22 MED ORDER — CYCLOBENZAPRINE HCL 5 MG PO TABS: 5.0000 mg | ORAL_TABLET | Freq: Three times a day (TID) | ORAL | 0 refills | Status: DC | PRN

## 2018-10-22 MED ORDER — OXYCODONE HCL 5 MG PO TABS: 5.0000 mg | ORAL_TABLET | ORAL | 0 refills | Status: DC | PRN

## 2018-10-22 MED ORDER — DOCUSATE SODIUM 100 MG PO CAPS: 100.0000 mg | ORAL_CAPSULE | Freq: Two times a day (BID) | ORAL | 0 refills | Status: AC

## 2018-10-22 NOTE — Progress Notes (Signed)
Pt and daughter given D/C instructions with verbal understanding. Rx's were sent to Pt's pharmacy. Pt's incision is clean and dry with no sign of infection. Pt's IV was removed prior to D/C. Pt D/C'd home via wheelchair @ 1500 per MD order. Pt is stable @ D/C and has no other needs at this time. Holli Humbles, RN

## 2018-10-22 NOTE — Discharge Summary (Signed)
Physician Discharge Summary  Patient ID: FLARA STORTI MRN: [id number] DOB/AGE: 1946/01/10 72 y.o.  Admit date: 10/20/2018 Discharge date: 10/22/2018  Admission Diagnoses: Lumbar spondylolisthesis, lumbar spinal stenosis, lumbago, lumbar radiculopathy, neurogenic claudication  Discharge Diagnoses: The same Active Problems:   Spondylolisthesis of lumbar region   Discharged Condition: good  Hospital Course: I performed an L3-4 and L4-5 decompression, instrumentation and fusion on the patient on 10/20/2018.  The surgery went well.  The patient's postoperative course was unremarkable.  On postoperative day #2 the patient, and her daughter, requested discharge to home.  They were given written and oral discharge instructions.  All their questions were answered.  Consults: Physical therapy Significant Diagnostic Studies: None Treatments: L3-4 and L4-5 decompression, instrumentation and fusion Discharge Exam: Blood pressure (!) 110/52, pulse 84, temperature 99.1 F (37.3 C), temperature source Oral, resp. rate 16, height 5\' 3"  (1.6 m), weight 59 kg, SpO2 99 %. The patient is alert and pleasant.  Her strength is normal.  She looks well.  Disposition: Home  Discharge Instructions    Call MD for:  difficulty breathing, headache or visual disturbances   Complete by:  As directed    Call MD for:  extreme fatigue   Complete by:  As directed    Call MD for:  hives   Complete by:  As directed    Call MD for:  persistant dizziness or light-headedness   Complete by:  As directed    Call MD for:  persistant nausea and vomiting   Complete by:  As directed    Call MD for:  redness, tenderness, or signs of infection (pain, swelling, redness, odor or green/yellow discharge around incision site)   Complete by:  As directed    Call MD for:  severe uncontrolled pain   Complete by:  As directed    Call MD for:  temperature >100.4   Complete by:  As directed    Diet - low sodium heart healthy    Complete by:  As directed    Discharge instructions   Complete by:  As directed    Call [phone] for a followup appointment. Take a stool softener while you are using pain medications.   Driving Restrictions   Complete by:  As directed    Do not drive for 2 weeks.   Increase activity slowly   Complete by:  As directed    Lifting restrictions   Complete by:  As directed    Do not lift more than 5 pounds. No excessive bending or twisting.   May shower / Bathe   Complete by:  As directed    Remove the dressing for 3 days after surgery.  You may shower, but leave the incision alone.   Remove dressing in 24 hours   Complete by:  As directed      Allergies as of 10/22/2018      Reactions   Flagyl [metronidazole] Nausea And Vomiting   Biaxin [clarithromycin]    Reaction: stomach cramps   Erythromycin    Reaction: stomach cramps      Medication List    STOP taking these medications   meloxicam 7.5 MG tablet Commonly known as:  MOBIC     TAKE these medications   acetaminophen 500 MG tablet Commonly known as:  TYLENOL Take 1,000 mg by mouth daily as needed for moderate pain or headache.   ASPERCREME MAX ROLL-ON EX Apply 1 application topically daily as needed (back pain).   aspirin EC 81  MG tablet Take 81 mg by mouth daily.   bismuth subsalicylate 295 JO/84ZY suspension Commonly known as:  PEPTO BISMOL Take 30 mLs by mouth daily as needed for diarrhea or loose stools.   CALCIUM + D PO Take 1 tablet by mouth daily.   cyclobenzaprine 5 MG tablet Commonly known as:  FLEXERIL Take 1-2 tablets (5-10 mg total) by mouth 3 (three) times daily as needed for muscle spasms.   docusate sodium 100 MG capsule Commonly known as:  COLACE Take 1 capsule (100 mg total) by mouth 2 (two) times daily.   DULoxetine 60 MG capsule Commonly known as:  CYMBALTA Take 60 mg by mouth 2 (two) times daily.   fluticasone 50 MCG/ACT nasal spray Commonly known as:  FLONASE Place 2 sprays  into both nostrils daily as needed for allergies.   Lidocaine 4 % Ptch Apply 1 patch topically daily as needed (pain).   LORazepam 0.5 MG tablet Commonly known as:  ATIVAN Take 0.5 mg by mouth at bedtime.   losartan-hydrochlorothiazide 50-12.5 MG tablet Commonly known as:  HYZAAR Take 1 tablet by mouth daily.   LUBRICATING EYE DROPS OP Place 1 drop into both eyes daily as needed (dry eyes).   montelukast 10 MG tablet Commonly known as:  SINGULAIR Take 10 mg by mouth daily.   OSTEO BI-FLEX ADV TRIPLE ST Tabs Take 1 tablet by mouth daily.   oxyCODONE 5 MG immediate release tablet Commonly known as:  Oxy IR/ROXICODONE Take 1 tablet (5 mg total) by mouth every 4 (four) hours as needed for moderate pain ((score 4 to 6)).   pantoprazole 40 MG tablet Commonly known as:  PROTONIX Take 1 tablet (40 mg total) by mouth daily.   Probiotic Caps Take 1 capsule by mouth daily.   simvastatin 20 MG tablet Commonly known as:  ZOCOR Take 20 mg by mouth daily at 6 PM.   traMADol 50 MG tablet Commonly known as:  ULTRAM Take 50 mg by mouth See admin instructions. Take 50 mg daily, may take a second 50 mg dose as needed for pain        Signed: Ophelia Charter 10/22/2018, 2:06 PM

## 2018-11-13 ENCOUNTER — Other Ambulatory Visit: Payer: Self-pay | Admitting: Pharmacist

## 2018-11-13 DIAGNOSIS — Z79899 Other long term (current) drug therapy: Secondary | ICD-10-CM | POA: Diagnosis not present

## 2018-11-13 DIAGNOSIS — E7800 Pure hypercholesterolemia, unspecified: Secondary | ICD-10-CM | POA: Diagnosis not present

## 2018-11-13 NOTE — Patient Outreach (Signed)
[facility]) Care Management  [place]   11/13/2018  Brittney Ramsey 1946/09/24 [id number]  Reason for referral: Medication Reconciliation Post Discharge  Referral source: [employer]  PMHx includes but not limited to: osteoarthritis, chronic lower back pain, hypertension, anxiety, hyperlipidemia  Outreach:  Successful telephone call with patient.  HIPAA identifiers verified.   Subjective:  Patient was admitted to [facility] 10/20/2018-10/22/2018 for lumbar fusion surgery. Today, she notes that recovery is slow, but that she is getting better. She drove for the first time today. She endorses continued pain, but is no longer taking oxycodone. She has transitioned back to tramadol + meloxicam daily for her pain.   She notes concerns of insomnia for the past few weeks, but since she is no longer taking oxycodone, she plans to restart lorazepam tonight. She denies constipation, and is taking docusate twice daily and supplementing with Miralax as needed. She notes that she has not started back on aspirin yet, she is unsure when she is supposed to do that. She denies any other questions or concerns regarding medication therapy or medication affordability.  Objective: Lab Results  Component Value Date   CREATININE 0.83 10/21/2018   CREATININE 1.05 (H) 10/13/2018   CREATININE 1.00 02/06/2016  eGFR >60   Lipid Panel  LDL 110 (05/09/2018 per [facility])   BP Readings from Last 3 Encounters:  10/22/18 (!) 110/52  10/13/18 (!) 128/57  08/28/16 (!) 169/69    Allergies  Allergen Reactions  . Flagyl [Metronidazole] Nausea And Vomiting  . Biaxin [Clarithromycin] Other (See Comments)    Reaction: stomach cramps  . Erythromycin Other (See Comments)    Reaction: stomach cramps    Medications Reviewed Today    Reviewed by De Hollingshead, [facility] [place] (Pharmacist) on 11/13/18 at 1332  Med List Status: <None>  Medication Order Taking? Sig  Documenting Provider Last Dose Status Informant  acetaminophen (TYLENOL) 500 MG tablet [id number] Yes Take 1,000 mg by mouth daily as needed for moderate pain or headache. [provider] Taking Active Self  aspirin EC 81 MG tablet [id number] No Take 81 mg by mouth daily. [provider] Not Taking Active Self           Med Note Jilda Roche A   Mon Oct 06, 2018  9:27 AM) On hold for procedure  bismuth subsalicylate (PEPTO BISMOL) 262 MG/15ML suspension [id number] Yes Take 30 mLs by mouth daily as needed for diarrhea or loose stools. [provider] Taking Active Self  Calcium Citrate-Vitamin D (CALCIUM + D PO) [id number] Yes Take 1 tablet by mouth daily. [provider] Taking Active Self  Carboxymethylcellul-Glycerin (LUBRICATING EYE DROPS OP) [id number] Yes Place 1 drop into both eyes daily as needed (dry eyes). [provider] Taking Active Self  cyclobenzaprine (FLEXERIL) 5 MG tablet [id number] No Take 1-2 tablets (5-10 mg total) by mouth 3 (three) times daily as needed for muscle spasms.  Patient not taking:  Reported on 11/13/2018   Newman Pies, MD Not Taking Active   docusate sodium (COLACE) 100 MG capsule [id number] Yes Take 1 capsule (100 mg total) by mouth 2 (two) times daily. Newman Pies, MD Taking Active   DULoxetine (CYMBALTA) 60 MG capsule [id number] Yes Take 60 mg by mouth 2 (two) times daily. [provider] Taking Active Self  fluticasone (FLONASE) 50 MCG/ACT nasal spray [id number] Yes Place 2 sprays into both nostrils daily as needed for allergies.  [provider] Taking Active Self  Med Note (TRAVIS,  E   Thu Nov 13, 2018  1:28 PM) Taking PRN   Lidocaine 4 % PTCH [id number] No Apply 1 patch topically daily as needed (pain). [provider] Not Taking Active Self  LORazepam (ATIVAN) 0.5 MG tablet [id number] Yes Take 0.5 mg by mouth at bedtime.  [provider] Taking Active Self   losartan-hydrochlorothiazide (HYZAAR) 50-12.5 MG tablet [id number] Yes Take 1 tablet by mouth daily. [provider] Taking Active Self  meloxicam (MOBIC) 7.5 MG tablet [id number] Yes Take 7.5 mg by mouth 2 (two) times daily. [provider] Taking Active   Menthol, Topical Analgesic, (ASPERCREME MAX ROLL-ON EX) [id number] No Apply 1 application topically daily as needed (back pain). [provider] Not Taking Active Self  Misc Natural Products (OSTEO BI-FLEX ADV TRIPLE ST) TABS [id number] Yes Take 1 tablet by mouth daily. [provider] Taking Active Self  montelukast (SINGULAIR) 10 MG tablet [id number] Yes Take 10 mg by mouth daily. [provider] Taking Active Self           Med Note Caryn Section, Marylyn Ishihara A   Mon Oct 06, 2018  9:21 AM)    oxyCODONE (OXY IR/ROXICODONE) 5 MG immediate release tablet [id number] No Take 1 tablet (5 mg total) by mouth every 4 (four) hours as needed for moderate pain ((score 4 to 6)).  Patient not taking:  Reported on 11/13/2018   Newman Pies, MD Not Taking Active   pantoprazole (PROTONIX) 40 MG tablet [id number] Yes Take 1 tablet (40 mg total) by mouth daily. Henreitta Leber, MD Taking Active Self           Med Note Darnelle Maffucci, Maralyn Sago Nov 13, 2018  1:30 PM) 1-2 times daily  polyethylene glycol (MIRALAX / GLYCOLAX) packet [id number] Yes Take 17 g by mouth daily as needed. [provider] Taking Active   Probiotic CAPS [id number] No Take 1 capsule by mouth daily. [provider] Not Taking Active Self  simvastatin (ZOCOR) 20 MG tablet [id number] Yes Take 20 mg by mouth daily at 6 PM. [provider] Taking Active Self           Med Note Jilda Roche A   Mon Oct 06, 2018  9:21 AM)    traMADol (ULTRAM) 50 MG tablet [id number] Yes Take 50 mg by mouth See admin instructions. Take 50 mg daily, may take a second 50 mg dose as needed for pain [provider] Taking Active Self           Med Note  Darnelle Maffucci, Arville Lime   Thu Nov 13, 2018  1:25 PM) Taking ~2-3 daily          Assessment:  Date Discharged from Hospital: 10/22/2018 Date Medication Reconciliation Performed: 11/13/2018  Medications:  New at Discharge: . Cyclobenzaprine . Docusate . Oxycodone  Adjustments at Discharge: . Aspirin (patient to hold until 10/26/2018)  Discontinued at Discharge:   Meloxicam  Patient was recently discharged from hospital and all medications have been reviewed.  Drugs sorted by system:  Neurologic/Psychologic: duloxetine, lorazepam  Cardiovascular: aspirin, losartan-HCTZ, simvastatin  Pulmonary/Allergy: fluticasone intranasal, montelukast  Gastrointestinal: pepto bismol, docusate, pantoprazole, Miralax  Topical: lidocaine patches, aspercream  Pain: acetaminophen, meloxicam, tramadol  Vitamins/Minerals/Supplements: calcium-vitamin D, osteo-biflex  Medication Review Findings:  . Per After Visit Summary from hospitalization, patient OK to restart aspirin on 10/26/2018. Informed patient of this; she expressed understanding . Counseled patient on combined sedative effects of cyclobenzaprine + oxycodone + lorazepam.  Patient agreed, noted that she has not been taking cyclobenzaprine, and held off on using lorazepam until she had finished oxycodone therapy  Plan: - Will route note to PCP Dr. Baldemar Lenis for notification - Patient has my contact information for any future questions or concerns  Catie Darnelle Maffucci, PharmD PGY2 Ambulatory Care Pharmacy Resident, [place] Phone: [phone]

## 2018-11-14 DIAGNOSIS — M4316 Spondylolisthesis, lumbar region: Secondary | ICD-10-CM | POA: Diagnosis not present

## 2018-11-18 DIAGNOSIS — Z0000 Encounter for general adult medical examination without abnormal findings: Secondary | ICD-10-CM | POA: Diagnosis not present

## 2019-01-01 DIAGNOSIS — D649 Anemia, unspecified: Secondary | ICD-10-CM | POA: Diagnosis not present

## 2019-01-01 DIAGNOSIS — Z1211 Encounter for screening for malignant neoplasm of colon: Secondary | ICD-10-CM | POA: Diagnosis not present

## 2019-01-19 ENCOUNTER — Other Ambulatory Visit: Payer: Self-pay | Admitting: Family Medicine

## 2019-01-19 DIAGNOSIS — Z1231 Encounter for screening mammogram for malignant neoplasm of breast: Secondary | ICD-10-CM

## 2019-01-21 ENCOUNTER — Ambulatory Visit
Admission: RE | Admit: 2019-01-21 | Discharge: 2019-01-21 | Disposition: A | Discharge status: Home / Self Care | Payer: PPO | Source: Ambulatory Visit | Attending: Family Medicine | Admitting: Family Medicine

## 2019-01-21 ENCOUNTER — Other Ambulatory Visit: Payer: Self-pay

## 2019-01-21 DIAGNOSIS — Z1231 Encounter for screening mammogram for malignant neoplasm of breast: Secondary | ICD-10-CM

## 2019-02-24 DIAGNOSIS — M4316 Spondylolisthesis, lumbar region: Secondary | ICD-10-CM | POA: Diagnosis not present

## 2019-03-23 DIAGNOSIS — Z1212 Encounter for screening for malignant neoplasm of rectum: Secondary | ICD-10-CM | POA: Diagnosis not present

## 2019-03-23 DIAGNOSIS — Z1211 Encounter for screening for malignant neoplasm of colon: Secondary | ICD-10-CM | POA: Diagnosis not present

## 2019-05-14 DIAGNOSIS — E7800 Pure hypercholesterolemia, unspecified: Secondary | ICD-10-CM | POA: Diagnosis not present

## 2019-05-14 DIAGNOSIS — Z79899 Other long term (current) drug therapy: Secondary | ICD-10-CM | POA: Diagnosis not present

## 2019-05-19 DIAGNOSIS — G8929 Other chronic pain: Secondary | ICD-10-CM | POA: Diagnosis not present

## 2019-05-19 DIAGNOSIS — K219 Gastro-esophageal reflux disease without esophagitis: Secondary | ICD-10-CM | POA: Diagnosis not present

## 2019-05-19 DIAGNOSIS — E7800 Pure hypercholesterolemia, unspecified: Secondary | ICD-10-CM | POA: Diagnosis not present

## 2019-05-19 DIAGNOSIS — Z79899 Other long term (current) drug therapy: Secondary | ICD-10-CM | POA: Diagnosis not present

## 2019-05-19 DIAGNOSIS — I10 Essential (primary) hypertension: Secondary | ICD-10-CM | POA: Diagnosis not present

## 2019-05-19 DIAGNOSIS — M545 Low back pain: Secondary | ICD-10-CM | POA: Diagnosis not present

## 2019-05-19 DIAGNOSIS — F419 Anxiety disorder, unspecified: Secondary | ICD-10-CM | POA: Diagnosis not present

## 2019-08-02 ENCOUNTER — Other Ambulatory Visit: Payer: Self-pay

## 2019-08-02 ENCOUNTER — Emergency Department: Payer: PPO

## 2019-08-02 ENCOUNTER — Inpatient Hospital Stay
Admission: EM | Admit: 2019-08-02 | Discharge: 2019-08-05 | DRG: 470 | Disposition: A | Discharge status: Home Health | Payer: PPO | Attending: Specialist | Admitting: Specialist

## 2019-08-02 ENCOUNTER — Encounter: Payer: Self-pay | Admitting: Emergency Medicine

## 2019-08-02 DIAGNOSIS — F329 Major depressive disorder, single episode, unspecified: Secondary | ICD-10-CM | POA: Diagnosis not present

## 2019-08-02 DIAGNOSIS — I10 Essential (primary) hypertension: Secondary | ICD-10-CM | POA: Diagnosis not present

## 2019-08-02 DIAGNOSIS — Z79891 Long term (current) use of opiate analgesic: Secondary | ICD-10-CM | POA: Diagnosis not present

## 2019-08-02 DIAGNOSIS — Y92009 Unspecified place in unspecified non-institutional (private) residence as the place of occurrence of the external cause: Secondary | ICD-10-CM | POA: Diagnosis not present

## 2019-08-02 DIAGNOSIS — D62 Acute posthemorrhagic anemia: Secondary | ICD-10-CM | POA: Diagnosis not present

## 2019-08-02 DIAGNOSIS — Z7982 Long term (current) use of aspirin: Secondary | ICD-10-CM | POA: Diagnosis not present

## 2019-08-02 DIAGNOSIS — Z03818 Encounter for observation for suspected exposure to other biological agents ruled out: Secondary | ICD-10-CM | POA: Diagnosis not present

## 2019-08-02 DIAGNOSIS — E871 Hypo-osmolality and hyponatremia: Secondary | ICD-10-CM | POA: Diagnosis not present

## 2019-08-02 DIAGNOSIS — W19XXXA Unspecified fall, initial encounter: Secondary | ICD-10-CM

## 2019-08-02 DIAGNOSIS — Z7951 Long term (current) use of inhaled steroids: Secondary | ICD-10-CM | POA: Diagnosis not present

## 2019-08-02 DIAGNOSIS — F419 Anxiety disorder, unspecified: Secondary | ICD-10-CM | POA: Diagnosis not present

## 2019-08-02 DIAGNOSIS — M1990 Unspecified osteoarthritis, unspecified site: Secondary | ICD-10-CM | POA: Diagnosis not present

## 2019-08-02 DIAGNOSIS — S72001A Fracture of unspecified part of neck of right femur, initial encounter for closed fracture: Principal | ICD-10-CM

## 2019-08-02 DIAGNOSIS — G629 Polyneuropathy, unspecified: Secondary | ICD-10-CM | POA: Diagnosis not present

## 2019-08-02 DIAGNOSIS — K219 Gastro-esophageal reflux disease without esophagitis: Secondary | ICD-10-CM | POA: Diagnosis not present

## 2019-08-02 DIAGNOSIS — E785 Hyperlipidemia, unspecified: Secondary | ICD-10-CM | POA: Diagnosis present

## 2019-08-02 DIAGNOSIS — Z888 Allergy status to other drugs, medicaments and biological substances status: Secondary | ICD-10-CM

## 2019-08-02 DIAGNOSIS — Z87891 Personal history of nicotine dependence: Secondary | ICD-10-CM

## 2019-08-02 DIAGNOSIS — Z20828 Contact with and (suspected) exposure to other viral communicable diseases: Secondary | ICD-10-CM | POA: Diagnosis present

## 2019-08-02 DIAGNOSIS — Z471 Aftercare following joint replacement surgery: Secondary | ICD-10-CM | POA: Diagnosis not present

## 2019-08-02 DIAGNOSIS — W1830XA Fall on same level, unspecified, initial encounter: Secondary | ICD-10-CM | POA: Diagnosis present

## 2019-08-02 DIAGNOSIS — Z791 Long term (current) use of non-steroidal anti-inflammatories (NSAID): Secondary | ICD-10-CM

## 2019-08-02 DIAGNOSIS — Z79899 Other long term (current) drug therapy: Secondary | ICD-10-CM

## 2019-08-02 DIAGNOSIS — S72044A Nondisplaced fracture of base of neck of right femur, initial encounter for closed fracture: Secondary | ICD-10-CM | POA: Diagnosis not present

## 2019-08-02 DIAGNOSIS — S299XXA Unspecified injury of thorax, initial encounter: Secondary | ICD-10-CM | POA: Diagnosis not present

## 2019-08-02 DIAGNOSIS — M79604 Pain in right leg: Secondary | ICD-10-CM | POA: Diagnosis present

## 2019-08-02 DIAGNOSIS — S72041A Displaced fracture of base of neck of right femur, initial encounter for closed fracture: Secondary | ICD-10-CM | POA: Diagnosis not present

## 2019-08-02 DIAGNOSIS — Z981 Arthrodesis status: Secondary | ICD-10-CM | POA: Diagnosis not present

## 2019-08-02 DIAGNOSIS — S72009A Fracture of unspecified part of neck of unspecified femur, initial encounter for closed fracture: Secondary | ICD-10-CM | POA: Diagnosis present

## 2019-08-02 DIAGNOSIS — Z881 Allergy status to other antibiotic agents status: Secondary | ICD-10-CM

## 2019-08-02 DIAGNOSIS — Z96641 Presence of right artificial hip joint: Secondary | ICD-10-CM | POA: Diagnosis not present

## 2019-08-02 DIAGNOSIS — Z96649 Presence of unspecified artificial hip joint: Secondary | ICD-10-CM

## 2019-08-02 LAB — COMPREHENSIVE METABOLIC PANEL
ALT: 21 U/L (ref 0–44)
AST: 30 U/L (ref 15–41)
Albumin: 4.5 g/dL (ref 3.5–5.0)
Alkaline Phosphatase: 50 U/L (ref 38–126)
Anion gap: 9 (ref 5–15)
BUN: 22 mg/dL (ref 8–23)
CO2: 26 mmol/L (ref 22–32)
Calcium: 9.5 mg/dL (ref 8.9–10.3)
Chloride: 99 mmol/L (ref 98–111)
Creatinine, Ser: 0.84 mg/dL (ref 0.44–1.00)
GFR calc Af Amer: >60 mL/min (ref >60)
GFR calc non Af Amer: >60 mL/min (ref >60)
Glucose, Bld: 110 mg/dL — ABNORMAL HIGH (ref 70–99)
Potassium: 3.7 mmol/L (ref 3.5–5.1)
Sodium: 134 mmol/L — ABNORMAL LOW (ref 135–145)
Total Bilirubin: 0.5 mg/dL (ref 0.3–1.2)
Total Protein: 7.6 g/dL (ref 6.5–8.1)

## 2019-08-02 LAB — CBC WITH DIFFERENTIAL/PLATELET
Abs Immature Granulocytes: 0.05 10*3/uL (ref 0.00–0.07)
Basophils Absolute: 0 10*3/uL (ref 0.0–0.1)
Basophils Relative: 0 %
Eosinophils Absolute: 0 10*3/uL (ref 0.0–0.5)
Eosinophils Relative: 0 %
HCT: 37.2 % (ref 36.0–46.0)
Hemoglobin: 12.3 g/dL (ref 12.0–15.0)
Immature Granulocytes: 0 %
Lymphocytes Relative: 10 %
Lymphs Abs: 1.3 10*3/uL (ref 0.7–4.0)
MCH: 31.2 pg (ref 26.0–34.0)
MCHC: 33.1 g/dL (ref 30.0–36.0)
MCV: 94.4 fL (ref 80.0–100.0)
Monocytes Absolute: 0.7 10*3/uL (ref 0.1–1.0)
Monocytes Relative: 6 %
Neutro Abs: 10.4 10*3/uL — ABNORMAL HIGH (ref 1.7–7.7)
Neutrophils Relative %: 84 %
Platelets: 316 10*3/uL (ref 150–400)
RBC: 3.94 MIL/uL (ref 3.87–5.11)
RDW: 13.9 % (ref 11.5–15.5)
WBC: 12.5 10*3/uL — ABNORMAL HIGH (ref 4.0–10.5)
nRBC: 0 % (ref 0.0–0.2)

## 2019-08-02 LAB — URINALYSIS, COMPLETE (UACMP) WITH MICROSCOPIC
Bilirubin Urine: NEGATIVE
Glucose, UA: NEGATIVE mg/dL
Hgb urine dipstick: NEGATIVE
Ketones, ur: NEGATIVE mg/dL
Nitrite: NEGATIVE
Protein, ur: NEGATIVE mg/dL
Specific Gravity, Urine: 1.015 (ref 1.005–1.030)
pH: 6 (ref 5.0–8.0)

## 2019-08-02 LAB — GLUCOSE, CAPILLARY: Glucose-Capillary: 99 mg/dL (ref 70–99)

## 2019-08-02 LAB — SARS CORONAVIRUS 2 BY RT PCR (HOSPITAL ORDER, PERFORMED IN ~~LOC~~ HOSPITAL LAB): SARS Coronavirus 2: NEGATIVE

## 2019-08-02 MED ORDER — FLUTICASONE PROPIONATE 50 MCG/ACT NA SUSP
2.0000 | Freq: Every day | NASAL | Status: DC | PRN
  Filled 2019-08-02: qty 16

## 2019-08-02 MED ORDER — LOSARTAN POTASSIUM 50 MG PO TABS: 50.0000 mg | ORAL_TABLET | Freq: Every day | ORAL | Status: DC

## 2019-08-02 MED ORDER — HYDROMORPHONE HCL 1 MG/ML IJ SOLN
0.5000 mg | Freq: Once | INTRAMUSCULAR | Status: AC
  Administered 2019-08-02: 0.5 mg via INTRAVENOUS
  Filled 2019-08-02: qty 1

## 2019-08-02 MED ORDER — LOSARTAN POTASSIUM-HCTZ 50-12.5 MG PO TABS: 1.0000 | ORAL_TABLET | Freq: Every day | ORAL | Status: DC

## 2019-08-02 MED ORDER — OXYCODONE HCL 5 MG PO TABS
5.0000 mg | ORAL_TABLET | ORAL | Status: DC | PRN
  Administered 2019-08-02 – 2019-08-03 (×2): 5 mg via ORAL
  Filled 2019-08-02 (×2): qty 1

## 2019-08-02 MED ORDER — DULOXETINE HCL 60 MG PO CPEP
60.0000 mg | ORAL_CAPSULE | Freq: Two times a day (BID) | ORAL | Status: DC
  Administered 2019-08-03 – 2019-08-05 (×5): 60 mg via ORAL
  Filled 2019-08-02 (×7): qty 1

## 2019-08-02 MED ORDER — MUPIROCIN 2 % EX OINT
1.0000 "application " | TOPICAL_OINTMENT | Freq: Two times a day (BID) | CUTANEOUS | Status: DC
  Filled 2019-08-02 (×2): qty 22

## 2019-08-02 MED ORDER — ONDANSETRON HCL 4 MG PO TABS: 4.0000 mg | ORAL_TABLET | Freq: Four times a day (QID) | ORAL | Status: DC | PRN

## 2019-08-02 MED ORDER — GABAPENTIN 100 MG PO CAPS
100.0000 mg | ORAL_CAPSULE | Freq: Two times a day (BID) | ORAL | Status: DC
  Administered 2019-08-03 – 2019-08-05 (×5): 100 mg via ORAL
  Filled 2019-08-02 (×5): qty 1

## 2019-08-02 MED ORDER — ACETAMINOPHEN 650 MG RE SUPP: 650.0000 mg | Freq: Four times a day (QID) | RECTAL | Status: DC | PRN

## 2019-08-02 MED ORDER — LOSARTAN POTASSIUM 50 MG PO TABS
50.0000 mg | ORAL_TABLET | Freq: Every day | ORAL | Status: DC
  Administered 2019-08-03 – 2019-08-05 (×2): 50 mg via ORAL
  Filled 2019-08-02 (×2): qty 1

## 2019-08-02 MED ORDER — MONTELUKAST SODIUM 10 MG PO TABS
10.0000 mg | ORAL_TABLET | Freq: Every day | ORAL | Status: DC
  Filled 2019-08-02 (×2): qty 1

## 2019-08-02 MED ORDER — HYDROMORPHONE HCL 1 MG/ML IJ SOLN: 0.5000 mg | INTRAMUSCULAR | Status: DC | PRN

## 2019-08-02 MED ORDER — SIMVASTATIN 20 MG PO TABS
20.0000 mg | ORAL_TABLET | Freq: Every day | ORAL | Status: DC
  Administered 2019-08-04: 20 mg via ORAL
  Filled 2019-08-02: qty 1

## 2019-08-02 MED ORDER — LORAZEPAM 0.5 MG PO TABS
0.5000 mg | ORAL_TABLET | Freq: Every day | ORAL | Status: DC
  Administered 2019-08-02 – 2019-08-04 (×3): 0.5 mg via ORAL
  Filled 2019-08-02 (×3): qty 1

## 2019-08-02 MED ORDER — HYDROCHLOROTHIAZIDE 12.5 MG PO CAPS: 12.5000 mg | ORAL_CAPSULE | Freq: Every day | ORAL | Status: DC

## 2019-08-02 MED ORDER — ACETAMINOPHEN 325 MG PO TABS: 650.0000 mg | ORAL_TABLET | Freq: Four times a day (QID) | ORAL | Status: DC | PRN

## 2019-08-02 MED ORDER — PANTOPRAZOLE SODIUM 40 MG PO TBEC
40.0000 mg | DELAYED_RELEASE_TABLET | Freq: Every day | ORAL | Status: DC
  Administered 2019-08-03: 40 mg via ORAL
  Filled 2019-08-02: qty 1

## 2019-08-02 MED ORDER — ONDANSETRON HCL 4 MG/2ML IJ SOLN
4.0000 mg | Freq: Four times a day (QID) | INTRAMUSCULAR | Status: DC | PRN
  Administered 2019-08-03: 4 mg via INTRAVENOUS
  Filled 2019-08-02: qty 2

## 2019-08-02 NOTE — ED Notes (Signed)
Patient transported to CT 

## 2019-08-02 NOTE — ED Notes (Signed)
Pt may eat and drink until MN per Ortho MD. Pt given meal tray and drink, pt tolerating well.

## 2019-08-02 NOTE — ED Triage Notes (Addendum)
Stepped on an uneven part of ground outside today.  Fell back and landed on right hip.  C/O right hip and pelvic pain, unable to bear weight.  Also c/o right wrist and elbow pain.  No obvious swelling or deformity seen.

## 2019-08-02 NOTE — H&P (Signed)
[place] [facility] [place] NAME: Brittney Ramsey    MR#:  [id number]  DATE OF BIRTH:  Nov 28, 1945  DATE OF ADMISSION:  08/02/2019  PRIMARY CARE PHYSICIAN: Derinda Late, MD   REQUESTING/REFERRING PHYSICIAN: Jimmye Norman, MD  CHIEF COMPLAINT:  Fall Hip pain  HISTORY OF PRESENT ILLNESS:  Brittney Ramsey  is a 73 y.o. female who presents with chief complaint as above.  Patient presents from home with mechanical fall and subsequent right hip pain.  On imaging here she is found to have a hip fracture.  Orthopedic surgery contacted by ED physician.  Hospitalist were called for admission  PAST MEDICAL HISTORY:   Past Medical History:  Diagnosis Date  . Allergic rhinitis   . Allergy   . Anxiety   . Arthritis   . Complication of anesthesia   . Hyperlipemia   . Hypertension   . PONV (postoperative nausea and vomiting)      PAST SURGICAL HISTORY:   Past Surgical History:  Procedure Laterality Date  . COLONOSCOPY    . ESOPHAGEAL MANOMETRY N/A 12/21/2015   Procedure: ESOPHAGEAL MANOMETRY (EM);  Surgeon: Josefine Class, MD;  Location[facility] ENDOSCOPY;  Service: Endoscopy;  Laterality: N/A;  . ESOPHAGOGASTRODUODENOSCOPY (EGD) WITH PROPOFOL N/A 11/04/2015   Procedure: ESOPHAGOGASTRODUODENOSCOPY (EGD) WITH PROPOFOL;  Surgeon: Hulen Luster, MD;  Location: [facility] ENDOSCOPY;  Service: Endoscopy;  Laterality: N/A;  . TUBAL LIGATION       SOCIAL HISTORY:   Social History   Tobacco Use  . Smoking status: Former Smoker    Types: Cigarettes  . Smokeless tobacco: Never Used  Substance Use Topics  . Alcohol use: No     FAMILY HISTORY:   Family History  Problem Relation Age of Onset  . Breast cancer Neg Hx      DRUG ALLERGIES:   Allergies  Allergen Reactions  . Flagyl [Metronidazole] Nausea And Vomiting  . Biaxin [Clarithromycin] Other (See Comments)    Reaction: stomach cramps  . Erythromycin Other (See Comments)    Reaction: stomach  cramps    MEDICATIONS AT HOME:   Prior to Admission medications   Medication Sig Start Date End Date Taking? Authorizing Provider  acetaminophen (TYLENOL) 500 MG tablet Take 1,000 mg by mouth daily as needed for moderate pain or headache.    [provider]  aspirin EC 81 MG tablet Take 81 mg by mouth daily.    [provider]  bismuth subsalicylate (PEPTO BISMOL) 262 MG/15ML suspension Take 30 mLs by mouth daily as needed for diarrhea or loose stools.    [provider]  Calcium Citrate-Vitamin D (CALCIUM + D PO) Take 1 tablet by mouth daily.    [provider]  Carboxymethylcellul-Glycerin (LUBRICATING EYE DROPS OP) Place 1 drop into both eyes daily as needed (dry eyes).    [provider]  cyclobenzaprine (FLEXERIL) 5 MG tablet Take 1-2 tablets (5-10 mg total) by mouth 3 (three) times daily as needed for muscle spasms. Patient not taking: Reported on 11/13/2018 10/22/18   Newman Pies, MD  docusate sodium (COLACE) 100 MG capsule Take 1 capsule (100 mg total) by mouth 2 (two) times daily. 10/22/18   Newman Pies, MD  DULoxetine (CYMBALTA) 60 MG capsule Take 60 mg by mouth 2 (two) times daily.    [provider]  fluticasone (FLONASE) 50 MCG/ACT nasal spray Place 2 sprays into both nostrils daily as needed for allergies.     [provider]  Lidocaine 4 % PTCH Apply 1 patch topically daily as needed (pain).    [provider]  LORazepam (ATIVAN) 0.5 MG tablet Take 0.5 mg by mouth at bedtime.     [provider]  losartan-hydrochlorothiazide (HYZAAR) 50-12.5 MG tablet Take 1 tablet by mouth daily.    [provider]  meloxicam (MOBIC) 7.5 MG tablet Take 7.5 mg by mouth 2 (two) times daily.    [provider]  Menthol, Topical Analgesic, (ASPERCREME MAX ROLL-ON EX) Apply 1 application topically daily as needed (back pain).    [provider]  Misc Natural Products (OSTEO BI-FLEX ADV  TRIPLE ST) TABS Take 1 tablet by mouth daily.    [provider]  montelukast (SINGULAIR) 10 MG tablet Take 10 mg by mouth daily.    [provider]  oxyCODONE (OXY IR/ROXICODONE) 5 MG immediate release tablet Take 1 tablet (5 mg total) by mouth every 4 (four) hours as needed for moderate pain ((score 4 to 6)). Patient not taking: Reported on 11/13/2018 10/22/18   Newman Pies, MD  pantoprazole (PROTONIX) 40 MG tablet Take 1 tablet (40 mg total) by mouth daily. 11/04/15   Henreitta Leber, MD  polyethylene glycol (MIRALAX / GLYCOLAX) packet Take 17 g by mouth daily as needed.    [provider]  Probiotic CAPS Take 1 capsule by mouth daily.    [provider]  simvastatin (ZOCOR) 20 MG tablet Take 20 mg by mouth daily at 6 PM.    [provider]  traMADol (ULTRAM) 50 MG tablet Take 50 mg by mouth See admin instructions. Take 50 mg daily, may take a second 50 mg dose as needed for pain    [provider]    REVIEW OF SYSTEMS:  Review of Systems  Constitutional: Negative for chills, fever, malaise/fatigue and weight loss.  HENT: Negative for ear pain, hearing loss and tinnitus.   Eyes: Negative for blurred vision, double vision, pain and redness.  Respiratory: Negative for cough, hemoptysis and shortness of breath.   Cardiovascular: Negative for chest pain, palpitations, orthopnea and leg swelling.  Gastrointestinal: Negative for abdominal pain, constipation, diarrhea, nausea and vomiting.  Genitourinary: Negative for dysuria, frequency and hematuria.  Musculoskeletal: Positive for falls and joint pain (Right hip). Negative for back pain and neck pain.  Skin:       No acne, rash, or lesions  Neurological: Negative for dizziness, tremors, focal weakness and weakness.  Endo/Heme/Allergies: Negative for polydipsia. Does not bruise/bleed easily.  Psychiatric/Behavioral: Negative for depression. The patient is not nervous/anxious and does not  have insomnia.      VITAL SIGNS:   Vitals:   08/02/19 1640 08/02/19 1645 08/02/19 1859 08/02/19 1900  BP: (!) 151/60   (!) 167/75  Pulse: 81   81  Resp: 16   12  Temp: 98.3 F (36.8 C)     TempSrc: Oral     SpO2: 100%   100%  Weight:  59 kg 63 kg   Height:   5\' 3"  (1.6 m)    Wt Readings from Last 3 Encounters:  08/02/19 63 kg  10/20/18 59 kg  10/13/18 59.3 kg    PHYSICAL EXAMINATION:  Physical Exam  Vitals reviewed. Constitutional: She is oriented to person, place, and time. She appears well-developed and well-nourished. No distress.  HENT:  Head: Normocephalic and atraumatic.  Mouth/Throat: Oropharynx is clear and moist.  Eyes: Pupils are equal, round, and reactive to light. Conjunctivae and EOM are normal. No  scleral icterus.  Neck: Normal range of motion. Neck supple. No JVD present. No thyromegaly present.  Cardiovascular: Normal rate, regular rhythm and intact distal pulses. Exam reveals no gallop and no friction rub.  No murmur heard. Respiratory: Effort normal and breath sounds normal. No respiratory distress. She has no wheezes. She has no rales.  GI: Soft. Bowel sounds are normal. She exhibits no distension. There is no abdominal tenderness.  Musculoskeletal: Normal range of motion.        General: Tenderness (Right hip) present. No edema.     Comments: No arthritis, no gout  Lymphadenopathy:    She has no cervical adenopathy.  Neurological: She is alert and oriented to person, place, and time. No cranial nerve deficit.  No dysarthria, no aphasia  Skin: Skin is warm and dry. No rash noted. No erythema.  Psychiatric: She has a normal mood and affect. Her behavior is normal. Judgment and thought content normal.    LABORATORY PANEL:   CBC Recent Labs  Lab 08/02/19 1921  WBC 12.5*  HGB 12.3  HCT 37.2  PLT 316   ------------------------------------------------------------------------------------------------------------------  Chemistries  Recent Labs   Lab 08/02/19 1921  NA 134*  K 3.7  CL 99  CO2 26  GLUCOSE 110*  BUN 22  CREATININE 0.84  CALCIUM 9.5  AST 30  ALT 21  ALKPHOS 50  BILITOT 0.5   ------------------------------------------------------------------------------------------------------------------  Cardiac Enzymes No results for input(s): TROPONINI in the last 168 hours. ------------------------------------------------------------------------------------------------------------------  RADIOLOGY:  Dg Chest 1 View  Result Date: 08/02/2019 CLINICAL DATA:  Fall, hip fracture EXAM: CHEST  1 VIEW COMPARISON:  11/02/2015 FINDINGS: The heart size and mediastinal contours are within normal limits. Both lungs are clear. The visualized skeletal structures are unremarkable. IMPRESSION: No acute abnormality of the lungs in AP portable projection. Electronically Signed   By: Eddie Candle M.D.   On: 08/02/2019 20:38   Dg Hip Unilat W Or Wo Pelvis 2-3 Views Right  Result Date: 08/02/2019 CLINICAL DATA:  Right hip pain after fall. EXAM: DG HIP (WITH OR WITHOUT PELVIS) 2-3V RIGHT COMPARISON:  Report from left hip radiograph 01/25/2016, images not available. FINDINGS: Impacted fracture of the right femoral neck. No significant angulation or proximal migration of the femoral shaft. Femoral head is well seated in the acetabulum with mild degenerative change. Pubic symphysis and sacroiliac joints are congruent. Pubic rami are intact. Surgical hardware in the lower lumbar spine is partially included. IMPRESSION: Impacted right femoral neck fracture. Electronically Signed   By: Keith Rake M.D.   On: 08/02/2019 19:25    EKG:   Orders placed or performed during the hospital encounter of 10/13/18  . EKG 12 lead  . EKG 12 lead    IMPRESSION AND PLAN:  Principal Problem:   Hip fracture, right ([place]) -patient has right hip fracture seen on imaging here in the ED.  This was after mechanical fall at home.  Orthopedic surgery consult and  further recommendations for further treatment of this problem Active Problems:   HTN (hypertension) -home dose antihypertensives   HLD (hyperlipidemia) -home dose antilipid  Chart review performed and case discussed with ED provider. Labs, imaging and/or ECG reviewed by provider and discussed with patient/family. Management plans discussed with the patient and/or family.  COVID-19 status: Pending  DVT PROPHYLAXIS: Mechanical only  GI PROPHYLAXIS:  None  ADMISSION STATUS: Inpatient     CODE STATUS: Full Code Status History    Date Active Date Inactive Code Status Order ID Comments  User Context   10/20/2018 1346 10/22/2018 1817 Full Code [id number]  Newman Pies, MD Inpatient   11/02/2015 2044 11/04/2015 1855 Full Code [id number]  Fritzi Mandes, MD Inpatient   Advance Care Planning Activity      TOTAL TIME TAKING CARE OF THIS PATIENT: 45 minutes.   This patient was evaluated in the context of the global COVID-19 pandemic, which necessitated consideration that the patient might be at risk for infection with the SARS-CoV-2 virus that causes COVID-19. Institutional protocols and algorithms that pertain to the evaluation of patients at risk for COVID-19 are in a state of rapid change based on information released by regulatory bodies including the CDC and federal and state organizations. These policies and algorithms were followed to the best of this provider's knowledge to date during the patient's care at this facility.  Ethlyn Daniels 08/02/2019, 8:53 PM  [facility]  Office  [phone]  CC: Primary care physician; Derinda Late, MD  Note:  This document was prepared using Dragon voice recognition software and may include unintentional dictation errors.

## 2019-08-02 NOTE — Consult Note (Signed)
ORTHOPAEDIC CONSULTATION  PATIENT NAME: Brittney Ramsey DOB: 10-11-1946  MRN: [id number]  REQUESTING PHYSICIAN: Earleen Newport, MD  Chief Complaint:   HPI: Brittney Ramsey is a 73 y.o. female who complains of fall at home.  Patient before the fall was ambulatory independently without any assistive devices.  She was brought to the ER at [facility].  X-rays shows what appears to be a possibly nondisplaced valgus impacted right femoral neck fracture.  Patient is currently awaiting a CT scan of her right hip.  Past Medical History:  Diagnosis Date  . Allergic rhinitis   . Allergy   . Anxiety   . Arthritis   . Complication of anesthesia   . Hyperlipemia   . Hypertension   . PONV (postoperative nausea and vomiting)    Past Surgical History:  Procedure Laterality Date  . COLONOSCOPY    . ESOPHAGEAL MANOMETRY N/A 12/21/2015   Procedure: ESOPHAGEAL MANOMETRY (EM);  Surgeon: Josefine Class, MD;  Location: [facility] ENDOSCOPY;  Service: Endoscopy;  Laterality: N/A;  . ESOPHAGOGASTRODUODENOSCOPY (EGD) WITH PROPOFOL N/A 11/04/2015   Procedure: ESOPHAGOGASTRODUODENOSCOPY (EGD) WITH PROPOFOL;  Surgeon: Hulen Luster, MD;  Location: [facility] ENDOSCOPY;  Service: Endoscopy;  Laterality: N/A;  . TUBAL LIGATION     Social History   Socioeconomic History  . Marital status: Married    Spouse name: Not on file  . Number of children: Not on file  . Years of education: Not on file  . Highest education level: Not on file  Occupational History  . Not on file  Social Needs  . Financial resource strain: Not on file  . Food insecurity    Worry: Not on file    Inability: Not on file  . Transportation needs    Medical: Not on file    Non-medical: Not on file  Tobacco Use  . Smoking status: Former Smoker    Types: Cigarettes  . Smokeless tobacco: Never Used  Substance and Sexual Activity  . Alcohol use: No  . Drug use: Never  . Sexual activity: Not on file  Lifestyle  .  Physical activity    Days per week: Not on file    Minutes per session: Not on file  . Stress: Not on file  Relationships  . Social Herbalist on phone: Not on file    Gets together: Not on file    Attends religious service: Not on file    Active member of club or organization: Not on file    Attends meetings of clubs or organizations: Not on file    Relationship status: Not on file  Other Topics Concern  . Not on file  Social History Narrative  . Not on file   Family History  Problem Relation Age of Onset  . Breast cancer Neg Hx    Allergies  Allergen Reactions  . Flagyl [Metronidazole] Nausea And Vomiting  . Biaxin [Clarithromycin] Other (See Comments)    Reaction: stomach cramps  . Erythromycin Other (See Comments)    Reaction: stomach cramps   Prior to Admission medications   Medication Sig Start Date End Date Taking? Authorizing Provider  acetaminophen (TYLENOL) 500 MG tablet Take 1,000 mg by mouth daily as needed for moderate pain or headache.    [provider]  aspirin EC 81 MG tablet Take 81 mg by mouth daily.    [provider]  bismuth subsalicylate (PEPTO BISMOL) 262 MG/15ML suspension Take 30 mLs by mouth  daily as needed for diarrhea or loose stools.    [provider]  Calcium Citrate-Vitamin D (CALCIUM + D PO) Take 1 tablet by mouth daily.    [provider]  Carboxymethylcellul-Glycerin (LUBRICATING EYE DROPS OP) Place 1 drop into both eyes daily as needed (dry eyes).    [provider]  cyclobenzaprine (FLEXERIL) 5 MG tablet Take 1-2 tablets (5-10 mg total) by mouth 3 (three) times daily as needed for muscle spasms. Patient not taking: Reported on 11/13/2018 10/22/18   Newman Pies, MD  docusate sodium (COLACE) 100 MG capsule Take 1 capsule (100 mg total) by mouth 2 (two) times daily. 10/22/18   Newman Pies, MD  DULoxetine (CYMBALTA) 60 MG capsule Take 60 mg by mouth 2 (two) times daily.    [provider]  fluticasone (FLONASE) 50 MCG/ACT nasal spray Place 2 sprays into both nostrils daily as needed for allergies.     [provider]  Lidocaine 4 % PTCH Apply 1 patch topically daily as needed (pain).    [provider]  LORazepam (ATIVAN) 0.5 MG tablet Take 0.5 mg by mouth at bedtime.     [provider]  losartan-hydrochlorothiazide (HYZAAR) 50-12.5 MG tablet Take 1 tablet by mouth daily.    [provider]  meloxicam (MOBIC) 7.5 MG tablet Take 7.5 mg by mouth 2 (two) times daily.    [provider]  Menthol, Topical Analgesic, (ASPERCREME MAX ROLL-ON EX) Apply 1 application topically daily as needed (back pain).    [provider]  Misc Natural Products (OSTEO BI-FLEX ADV TRIPLE ST) TABS Take 1 tablet by mouth daily.    [provider]  montelukast (SINGULAIR) 10 MG tablet Take 10 mg by mouth daily.    [provider]  oxyCODONE (OXY IR/ROXICODONE) 5 MG immediate release tablet Take 1 tablet (5 mg total) by mouth every 4 (four) hours as needed for moderate pain ((score 4 to 6)). Patient not taking: Reported on 11/13/2018 10/22/18   Newman Pies, MD  pantoprazole (PROTONIX) 40 MG tablet Take 1 tablet (40 mg total) by mouth daily. 11/04/15   Henreitta Leber, MD  polyethylene glycol (MIRALAX / GLYCOLAX) packet Take 17 g by mouth daily as needed.    [provider]  Probiotic CAPS Take 1 capsule by mouth daily.    [provider]  simvastatin (ZOCOR) 20 MG tablet Take 20 mg by mouth daily at 6 PM.    [provider]  traMADol (ULTRAM) 50 MG tablet Take 50 mg by mouth See admin instructions. Take 50 mg daily, may take a second 50 mg dose as needed for pain    [provider]   Dg Hip Unilat W Or Wo Pelvis 2-3 Views Right  Result Date: 08/02/2019 CLINICAL DATA:  Right hip pain after fall. EXAM: DG HIP (WITH OR WITHOUT PELVIS) 2-3V RIGHT COMPARISON:  Report from left hip  radiograph 01/25/2016, images not available. FINDINGS: Impacted fracture of the right femoral neck. No significant angulation or proximal migration of the femoral shaft. Femoral head is well seated in the acetabulum with mild degenerative change. Pubic symphysis and sacroiliac joints are congruent. Pubic rami are intact. Surgical hardware in the lower lumbar spine is partially included. IMPRESSION: Impacted right femoral neck fracture. Electronically Signed   By: Keith Rake M.D.   On: 08/02/2019 19:25    Positive ROS: All other systems have been reviewed and were otherwise negative with the exception of those mentioned in the  HPI and as above.  Physical Exam: General: Well developed, well nourished female seen in no acute distress. HEENT: Atraumatic and normocephalic. Sclera are clear. Extraocular motion is intact. Oropharynx is clear with moist mucosa. Neck: Supple, nontender, good range of motion. No JVD or carotid bruits. Lungs: Clear to auscultation bilaterally. Cardiovascular: Regular rate and rhythm with normal S1 and S2. No murmurs. No gallops or rubs. Pedal pulses are palpable bilaterally. Homans test is negative bilaterally. No significant pretibial or ankle edema. Abdomen: Soft, nontender, and nondistended. Bowel sounds are present. Skin: No lesions in the area of chief complaint Neurologic: Awake, alert, and oriented. Sensory function is grossly intact. Motor strength is felt to be 5 over 5 bilaterally. No clonus or tremor. Good motor coordination. Lymphatic: No axillary or cervical lymphadenopathy  MUSCULOSKELETAL: Patient is currently comfortable in bed.  Right hip has pain with logroll.  She maintains intact flexion and extension of her toes and ankle.  Leg compartments are soft.  Dorsalis pedis pulses are palpable.  Assessment: 73 years old female with valgus impacted Nnondisplaced right femoral neck fracture.  Plan: 73 years old female status post fall with right femoral  neck valgus impacted fracture.  I had a detailed discussion with the patient.  Patient is currently awaiting CT scan of her right hip.  Based on the results of the CT scan patient will be either scheduled for percutaneous screw fixation of a right hip hemiarthroplasty by the on-call team on 08/03/2019.  Creig Hines, M.D.

## 2019-08-02 NOTE — ED Notes (Signed)
ED TO INPATIENT HANDOFF REPORT  ED Nurse Name and Phone #: Gwenette Greet [phone]  S Name/Age/Gender Brittney Ramsey 73 y.o. female Room/Bed: ED10A/ED10A  Code Status   Code Status: Prior  Home/SNF/Other Home Patient oriented to: self, place, time and situation Is this baseline? Yes   Triage Complete: Triage complete  Chief Complaint fall  Triage Note Stepped on an uneven part of ground outside today.  Fell back and landed on right hip.  C/O right hip and pelvic pain, unable to bear weight.  Also c/o right wrist and elbow pain.  No obvious swelling or deformity seen.   Allergies Allergies  Allergen Reactions  . Flagyl [Metronidazole] Nausea And Vomiting  . Biaxin [Clarithromycin] Other (See Comments)    Reaction: stomach cramps  . Erythromycin Other (See Comments)    Reaction: stomach cramps    Level of Care/Admitting Diagnosis ED Disposition    ED Disposition Condition Comment   Admit  Hospital Area: [place] [[id number]]  Level of Care: Med-Surg [16]  Covid Evaluation: Asymptomatic Screening Protocol (No Symptoms)  [facility]: [id number]  Admitting Physician: Lance Coon [id number]  Attending Physician: Lance Coon [phone]  Estimated length of stay: past midnight tomorrow  Certification:: I certify this patient will need inpatient services for at least 2 midnights  PT Class (Do Not Modify): Inpatient [101]  PT Acc Code (Do Not Modify): Private [1]       B Medical/Surgery History Past Medical History:  Diagnosis Date  . Allergic rhinitis   . Allergy   . Anxiety   . Arthritis   . Complication of anesthesia   . Hyperlipemia   . Hypertension   . PONV (postoperative nausea and vomiting)    Past Surgical History:  Procedure Laterality Date  . COLONOSCOPY    . ESOPHAGEAL MANOMETRY N/A 12/21/2015   Procedure: ESOPHAGEAL MANOMETRY (EM);  Surgeon: Josefine Class, MD;  Location: [facility] ENDOSCOPY;  Service: Endoscopy;   Laterality: N/A;  . ESOPHAGOGASTRODUODENOSCOPY (EGD) WITH PROPOFOL N/A 11/04/2015   Procedure: ESOPHAGOGASTRODUODENOSCOPY (EGD) WITH PROPOFOL;  Surgeon: Hulen Luster, MD;  Location: [facility] ENDOSCOPY;  Service: Endoscopy;  Laterality: N/A;  . TUBAL LIGATION       A IV Location/Drains/Wounds Patient Lines/Drains/Airways Status   Active Line/Drains/Airways    Name:   Placement date:   Placement time:   Site:   Days:   Peripheral IV 10/20/18 Left Wrist   10/20/18    0650    Wrist   286   Peripheral IV 08/02/19 Left Antecubital   08/02/19    1920    Antecubital   less than 1   Incision (Closed) 10/20/18 Back Other (Comment)   10/20/18    0855     286          Intake/Output Last 24 hours No intake or output data in the 24 hours ending 08/02/19 2100  Labs/Imaging Results for orders placed or performed during the hospital encounter of 08/02/19 (from the past 48 hour(s))  CBC with Differential     Status: Abnormal   Collection Time: 08/02/19  7:21 PM  Result Value Ref Range   WBC 12.5 (H) 4.0 - 10.5 K/uL   RBC 3.94 3.87 - 5.11 MIL/uL   Hemoglobin 12.3 12.0 - 15.0 g/dL   HCT 37.2 36.0 - 46.0 %   MCV 94.4 80.0 - 100.0 fL   MCH 31.2 26.0 - 34.0 pg   MCHC 33.1 30.0 - 36.0 g/dL   RDW 13.9  11.5 - 15.5 %   Platelets 316 150 - 400 K/uL   nRBC 0.0 0.0 - 0.2 %   Neutrophils Relative % 84 %   Neutro Abs 10.4 (H) 1.7 - 7.7 K/uL   Lymphocytes Relative 10 %   Lymphs Abs 1.3 0.7 - 4.0 K/uL   Monocytes Relative 6 %   Monocytes Absolute 0.7 0.1 - 1.0 K/uL   Eosinophils Relative 0 %   Eosinophils Absolute 0.0 0.0 - 0.5 K/uL   Basophils Relative 0 %   Basophils Absolute 0.0 0.0 - 0.1 K/uL   Immature Granulocytes 0 %   Abs Immature Granulocytes 0.05 0.00 - 0.07 K/uL    Comment: Performed at [facility], [address] [place], [place] [address]  Comprehensive metabolic panel     Status: Abnormal   Collection Time: 08/02/19  7:21 PM  Result Value Ref Range   Sodium 134 (L) 135 - 145  mmol/L   Potassium 3.7 3.5 - 5.1 mmol/L   Chloride 99 98 - 111 mmol/L   CO2 26 22 - 32 mmol/L   Glucose, Bld 110 (H) 70 - 99 mg/dL   BUN 22 8 - 23 mg/dL   Creatinine, Ser 0.84 0.44 - 1.00 mg/dL   Calcium 9.5 8.9 - 10.3 mg/dL   Total Protein 7.6 6.5 - 8.1 g/dL   Albumin 4.5 3.5 - 5.0 g/dL   AST 30 15 - 41 U/L   ALT 21 0 - 44 U/L   Alkaline Phosphatase 50 38 - 126 U/L   Total Bilirubin 0.5 0.3 - 1.2 mg/dL   GFR calc non Af Amer >60 >60 mL/min   GFR calc Af Amer >60 >60 mL/min   Anion gap 9 5 - 15    Comment: Performed at [facility], [address] [place],  [address]  Urinalysis, Complete w Microscopic     Status: Abnormal   Collection Time: 08/02/19  8:02 PM  Result Value Ref Range   Color, Urine YELLOW (A) YELLOW   APPearance HAZY (A) CLEAR   Specific Gravity, Urine 1.015 1.005 - 1.030   pH 6.0 5.0 - 8.0   Glucose, UA NEGATIVE NEGATIVE mg/dL   Hgb urine dipstick NEGATIVE NEGATIVE   Bilirubin Urine NEGATIVE NEGATIVE   Ketones, ur NEGATIVE NEGATIVE mg/dL   Protein, ur NEGATIVE NEGATIVE mg/dL   Nitrite NEGATIVE NEGATIVE   Leukocytes,Ua SMALL (A) NEGATIVE   RBC / HPF 11-20 0 - 5 RBC/hpf   WBC, UA 11-20 0 - 5 WBC/hpf   Bacteria, UA RARE (A) NONE SEEN   Squamous Epithelial / LPF 0-5 0 - 5    Comment: Performed at [facility], [address] [place], [place] [address]  Glucose, capillary     Status: None   Collection Time: 08/02/19  8:13 PM  Result Value Ref Range   Glucose-Capillary 99 70 - 99 mg/dL   Dg Chest 1 View  Result Date: 08/02/2019 CLINICAL DATA:  Fall, hip fracture EXAM: CHEST  1 VIEW COMPARISON:  11/02/2015 FINDINGS: The heart size and mediastinal contours are within normal limits. Both lungs are clear. The visualized skeletal structures are unremarkable. IMPRESSION: No acute abnormality of the lungs in AP portable projection. Electronically Signed   By: Eddie Candle M.D.   On: 08/02/2019 20:38   Dg Hip Unilat W Or Wo Pelvis 2-3  Views Right  Result Date: 08/02/2019 CLINICAL DATA:  Right hip pain after fall. EXAM: DG HIP (WITH OR WITHOUT PELVIS) 2-3V RIGHT  COMPARISON:  Report from left hip radiograph 01/25/2016, images not available. FINDINGS: Impacted fracture of the right femoral neck. No significant angulation or proximal migration of the femoral shaft. Femoral head is well seated in the acetabulum with mild degenerative change. Pubic symphysis and sacroiliac joints are congruent. Pubic rami are intact. Surgical hardware in the lower lumbar spine is partially included. IMPRESSION: Impacted right femoral neck fracture. Electronically Signed   By: Keith Rake M.D.   On: 08/02/2019 19:25    Pending Labs Unresulted Labs (From admission, onward)    Start     Ordered   08/02/19 1937  SARS Coronavirus 2 [facility] order, Performed in [facility] hospital lab) Nasopharyngeal Urine, Clean Catch  (Symptomatic/High Risk of Exposure/Tier 1 Patients Labs with Precautions)  Once,   STAT    Question Answer Comment  Is this test for diagnosis or screening Diagnosis of ill patient   Symptomatic for COVID-19 as defined by CDC Yes   Date of Symptom Onset 08/02/2019   Hospitalized for COVID-19 No   Admitted to ICU for COVID-19 No   Previously tested for COVID-19 No   Resident in a congregate (group) care setting No   Employed in healthcare setting No   Pregnant No      08/02/19 1936   Signed and Held  Basic metabolic panel  Tomorrow morning,   R     Signed and Held   Signed and Held  CBC  Tomorrow morning,   R     Signed and Held          Vitals/Pain Today's Vitals   08/02/19 1930 08/02/19 2001 08/02/19 2030 08/02/19 2030  BP: (!) 150/67  (!) 161/76   Pulse: 90  95   Resp: (!) 23  18   Temp:      TempSrc:      SpO2: 97%  97%   Weight:      Height:      PainSc:  9   8     Isolation Precautions No active isolations  Medications Medications  HYDROmorphone (DILAUDID) injection 0.5 mg (0.5 mg Intravenous Given  08/02/19 1925)  HYDROmorphone (DILAUDID) injection 0.5 mg (0.5 mg Intravenous Given 08/02/19 2009)    Mobility walks with device Low fall risk   Focused Assessments see musculoskeletal assessment   R Recommendations: See Admitting Provider Note  Report given to:   Additional Notes: NA

## 2019-08-02 NOTE — ED Notes (Signed)
Pt with c/o of right pelvis pain after fall at home. Pt tripped on root. Pt with sore right wrist, no swelling or deformation noted, small abrasion to right elbow. No deformity noted to right leg.

## 2019-08-02 NOTE — ED Provider Notes (Signed)
[facility] Emergency Department Provider Note       Time seen: ----------------------------------------- 6:51 PM on 08/02/2019 -----------------------------------------   I have reviewed the triage vital signs and the nursing notes.  HISTORY   Chief Complaint No chief complaint on file.    HPI Brittney Ramsey is a 73 y.o. female with a history of allergies, anxiety, arthritis, hyperlipidemia, hypertension who presents to the ED for a fall.  Patient states she stepped on uneven part of the ground outside today, fell back and landed on her right hip.  She is unable to bear weight on the right leg.  Had complained of some right wrist pain but she thinks it sprained.  She can move it without significant pain.  Discomfort is 10 out of 10 in the right hip.  Past Medical History:  Diagnosis Date  . Allergic rhinitis   . Allergy   . Anxiety   . Arthritis   . Complication of anesthesia   . Hyperlipemia   . Hypertension   . PONV (postoperative nausea and vomiting)     Patient Active Problem List   Diagnosis Date Noted  . Spondylolisthesis of lumbar region 10/20/2018  . Hyponatremia 11/02/2015    Past Surgical History:  Procedure Laterality Date  . COLONOSCOPY    . ESOPHAGEAL MANOMETRY N/A 12/21/2015   Procedure: ESOPHAGEAL MANOMETRY (EM);  Surgeon: Josefine Class, MD;  Location: [facility] ENDOSCOPY;  Service: Endoscopy;  Laterality: N/A;  . ESOPHAGOGASTRODUODENOSCOPY (EGD) WITH PROPOFOL N/A 11/04/2015   Procedure: ESOPHAGOGASTRODUODENOSCOPY (EGD) WITH PROPOFOL;  Surgeon: Hulen Luster, MD;  Location: [facility] ENDOSCOPY;  Service: Endoscopy;  Laterality: N/A;  . TUBAL LIGATION      Allergies Flagyl [metronidazole], Biaxin [clarithromycin], and Erythromycin  Social History Social History   Tobacco Use  . Smoking status: Former Smoker    Types: Cigarettes  . Smokeless tobacco: Never Used  Substance Use Topics  . Alcohol use: No  . Drug use: Never    Review of Systems Constitutional: Negative for fever. Cardiovascular: Negative for chest pain. Respiratory: Negative for shortness of breath. Gastrointestinal: Negative for abdominal pain, vomiting and diarrhea. Musculoskeletal: Positive for right hip pain Skin: Negative for rash. Neurological: Negative for headaches, focal weakness or numbness.  All systems negative/normal/unremarkable except as stated in the HPI  ____________________________________________   PHYSICAL EXAM:  VITAL SIGNS: ED Triage Vitals  Enc Vitals Group     BP 08/02/19 1640 (!) 151/60     Pulse Rate 08/02/19 1640 81     Resp 08/02/19 1640 16     Temp 08/02/19 1640 98.3 F (36.8 C)     Temp Source 08/02/19 1640 Oral     SpO2 08/02/19 1640 100 %     Weight 08/02/19 1645 130 lb 1.1 oz (59 kg)     Height --      Head Circumference --      Peak Flow --      Pain Score 08/02/19 1645 10     Pain Loc --      Pain Edu? --      Excl. in [place]? --    Constitutional: Alert and oriented. Well appearing and in no distress. Eyes: Conjunctivae are normal. Normal extraocular movements. ENT      Head: Normocephalic and atraumatic.      Nose: No congestion/rhinnorhea.      Mouth/Throat: Mucous membranes are moist.      Neck: No stridor. Cardiovascular: Normal rate, regular rhythm. No murmurs, rubs, or  gallops. Respiratory: Normal respiratory effort without tachypnea nor retractions. Breath sounds are clear and equal bilaterally. No wheezes/rales/rhonchi. Gastrointestinal: Soft and nontender. Normal bowel sounds Musculoskeletal: Severe pain with range of motion of the right hip, right lower extremity is neurovascular intact Neurologic:  Normal speech and language. No gross focal neurologic deficits are appreciated.  Skin:  Skin is warm, dry and intact. No rash noted. Psychiatric: Mood and affect are normal. Speech and behavior are normal.  ____________________________________________  ED COURSE:  As part of my  medical decision making, I reviewed the following data within the [place] History obtained from family if available, nursing notes, old chart and ekg, as well as notes from prior ED visits. Patient presented for right hip pain, we will assess with labs and imaging as indicated at this time.   Procedures  Brittney Ramsey was evaluated in Emergency Department on 08/02/2019 for the symptoms described in the history of present illness. She was evaluated in the context of the global COVID-19 pandemic, which necessitated consideration that the patient might be at risk for infection with the SARS-CoV-2 virus that causes COVID-19. Institutional protocols and algorithms that pertain to the evaluation of patients at risk for COVID-19 are in a state of rapid change based on information released by regulatory bodies including the CDC and federal and state organizations. These policies and algorithms were followed during the patient's care in the ED.  ____________________________________________   LABS (pertinent positives/negatives)  Labs Reviewed  SARS CORONAVIRUS 2 (HOSPITAL ORDER, [place] LAB)  CBC WITH DIFFERENTIAL/PLATELET  COMPREHENSIVE METABOLIC PANEL  URINALYSIS, COMPLETE (UACMP) WITH MICROSCOPIC  CBG MONITORING, ED    RADIOLOGY Images were viewed by me  Right hip x-rays  IMPRESSION:  Impacted right femoral neck fracture.  ____________________________________________   DIFFERENTIAL DIAGNOSIS   Contusion, fracture, dislocation  FINAL ASSESSMENT AND PLAN  Right femoral neck fracture   Plan: The patient had presented for fall with resulting right hip fracture. Patient's labs are still pending at this time. Patient's imaging displaced a right femoral neck fracture.  I have discussed with orthopedics on-call, I will discuss with the hospitalist for admission.   Laurence Aly, MD    Note: This note was generated in part or whole  with voice recognition software. Voice recognition is usually quite accurate but there are transcription errors that can and very often do occur. I apologize for any typographical errors that were not detected and corrected.     Earleen Newport, MD 08/02/19 [phone]

## 2019-08-03 ENCOUNTER — Inpatient Hospital Stay: Payer: PPO

## 2019-08-03 ENCOUNTER — Encounter
Admission: EM | Disposition: A | Discharge status: Home Health | Payer: Self-pay | Source: Home / Self Care | Attending: Specialist

## 2019-08-03 ENCOUNTER — Inpatient Hospital Stay: Payer: PPO | Admitting: Anesthesiology

## 2019-08-03 ENCOUNTER — Encounter: Payer: Self-pay | Admitting: Orthopedic Surgery

## 2019-08-03 HISTORY — PX: HIP ARTHROPLASTY: SHX981

## 2019-08-03 LAB — BASIC METABOLIC PANEL
Anion gap: 9 (ref 5–15)
BUN: 18 mg/dL (ref 8–23)
CO2: 26 mmol/L (ref 22–32)
Calcium: 8.8 mg/dL — ABNORMAL LOW (ref 8.9–10.3)
Chloride: 97 mmol/L — ABNORMAL LOW (ref 98–111)
Creatinine, Ser: 0.69 mg/dL (ref 0.44–1.00)
GFR calc Af Amer: >60 mL/min (ref >60)
GFR calc non Af Amer: >60 mL/min (ref >60)
Glucose, Bld: 116 mg/dL — ABNORMAL HIGH (ref 70–99)
Potassium: 3.5 mmol/L (ref 3.5–5.1)
Sodium: 132 mmol/L — ABNORMAL LOW (ref 135–145)

## 2019-08-03 LAB — CBC
HCT: 33.9 % — ABNORMAL LOW (ref 36.0–46.0)
Hemoglobin: 11.3 g/dL — ABNORMAL LOW (ref 12.0–15.0)
MCH: 31.2 pg (ref 26.0–34.0)
MCHC: 33.3 g/dL (ref 30.0–36.0)
MCV: 93.6 fL (ref 80.0–100.0)
Platelets: 260 10*3/uL (ref 150–400)
RBC: 3.62 MIL/uL — ABNORMAL LOW (ref 3.87–5.11)
RDW: 13.8 % (ref 11.5–15.5)
WBC: 8.2 10*3/uL (ref 4.0–10.5)
nRBC: 0 % (ref 0.0–0.2)

## 2019-08-03 LAB — SURGICAL PCR SCREEN
MRSA, PCR: NEGATIVE
Staphylococcus aureus: POSITIVE — AB

## 2019-08-03 SURGERY — HEMIARTHROPLASTY, HIP, DIRECT ANTERIOR APPROACH, FOR FRACTURE
Anesthesia: General | Site: Hip | Laterality: Right

## 2019-08-03 MED ORDER — ACETAMINOPHEN 325 MG PO TABS: 325.0000 mg | ORAL_TABLET | Freq: Four times a day (QID) | ORAL | Status: DC | PRN

## 2019-08-03 MED ORDER — MENTHOL 3 MG MT LOZG
1.0000 | LOZENGE | OROMUCOSAL | Status: DC | PRN
  Administered 2019-08-04 (×2): 3 mg via ORAL
  Filled 2019-08-03 (×2): qty 9

## 2019-08-03 MED ORDER — ONDANSETRON HCL 4 MG PO TABS: 4.0000 mg | ORAL_TABLET | Freq: Four times a day (QID) | ORAL | Status: DC | PRN

## 2019-08-03 MED ORDER — ROCURONIUM BROMIDE 50 MG/5ML IV SOLN
INTRAVENOUS | Status: AC
  Filled 2019-08-03: qty 1

## 2019-08-03 MED ORDER — PROMETHAZINE HCL 25 MG/ML IJ SOLN
12.5000 mg | Freq: Four times a day (QID) | INTRAMUSCULAR | Status: DC | PRN
  Administered 2019-08-03: 12.5 mg via INTRAVENOUS
  Filled 2019-08-03: qty 1

## 2019-08-03 MED ORDER — CEFAZOLIN SODIUM-DEXTROSE 2-4 GM/100ML-% IV SOLN
2.0000 g | Freq: Four times a day (QID) | INTRAVENOUS | Status: AC
  Administered 2019-08-03 – 2019-08-04 (×3): 2 g via INTRAVENOUS
  Filled 2019-08-03 (×4): qty 100

## 2019-08-03 MED ORDER — OXYCODONE HCL 5 MG PO TABS: 10.0000 mg | ORAL_TABLET | ORAL | Status: DC | PRN

## 2019-08-03 MED ORDER — METOCLOPRAMIDE HCL 10 MG PO TABS
10.0000 mg | ORAL_TABLET | Freq: Three times a day (TID) | ORAL | Status: DC
  Administered 2019-08-04 – 2019-08-05 (×6): 10 mg via ORAL
  Filled 2019-08-03 (×6): qty 1

## 2019-08-03 MED ORDER — HYDROMORPHONE HCL 1 MG/ML IJ SOLN: 0.2500 mg | INTRAMUSCULAR | Status: DC | PRN

## 2019-08-03 MED ORDER — PANTOPRAZOLE SODIUM 40 MG PO TBEC
40.0000 mg | DELAYED_RELEASE_TABLET | Freq: Two times a day (BID) | ORAL | Status: DC
  Administered 2019-08-03 – 2019-08-05 (×4): 40 mg via ORAL
  Filled 2019-08-03 (×4): qty 1

## 2019-08-03 MED ORDER — ONDANSETRON HCL 4 MG/2ML IJ SOLN
INTRAMUSCULAR | Status: AC
  Filled 2019-08-03: qty 2

## 2019-08-03 MED ORDER — CEFAZOLIN SODIUM-DEXTROSE 2-4 GM/100ML-% IV SOLN
2.0000 g | INTRAVENOUS | Status: AC
  Administered 2019-08-03: 2 g via INTRAVENOUS
  Filled 2019-08-03: qty 100

## 2019-08-03 MED ORDER — HYDROMORPHONE HCL 1 MG/ML IJ SOLN: 0.5000 mg | INTRAMUSCULAR | Status: DC | PRN

## 2019-08-03 MED ORDER — PROPOFOL 10 MG/ML IV BOLUS
INTRAVENOUS | Status: DC | PRN
  Administered 2019-08-03: 100 mg via INTRAVENOUS
  Administered 2019-08-03: 20 mg via INTRAVENOUS
  Administered 2019-08-03: 30 mg via INTRAVENOUS

## 2019-08-03 MED ORDER — HYDROMORPHONE HCL 1 MG/ML IJ SOLN
INTRAMUSCULAR | Status: DC | PRN
  Administered 2019-08-03: 0.5 mg via INTRAVENOUS
  Administered 2019-08-03: .25 mg via INTRAVENOUS

## 2019-08-03 MED ORDER — LIDOCAINE HCL (CARDIAC) PF 100 MG/5ML IV SOSY
PREFILLED_SYRINGE | INTRAVENOUS | Status: DC | PRN
  Administered 2019-08-03: 60 mg via INTRAVENOUS

## 2019-08-03 MED ORDER — TRAMADOL HCL 50 MG PO TABS: 50.0000 mg | ORAL_TABLET | ORAL | Status: DC | PRN

## 2019-08-03 MED ORDER — NEOMYCIN-POLYMYXIN B GU 40-200000 IR SOLN
Status: DC | PRN
  Administered 2019-08-03: 2 mL

## 2019-08-03 MED ORDER — BISACODYL 10 MG RE SUPP: 10.0000 mg | Freq: Every day | RECTAL | Status: DC | PRN

## 2019-08-03 MED ORDER — SODIUM CHLORIDE 0.9 % IV SOLN
INTRAVENOUS | Status: DC
  Administered 2019-08-03 – 2019-08-04 (×2): via INTRAVENOUS

## 2019-08-03 MED ORDER — KETAMINE HCL 50 MG/ML IJ SOLN
INTRAMUSCULAR | Status: AC
  Filled 2019-08-03: qty 10

## 2019-08-03 MED ORDER — DEXAMETHASONE SODIUM PHOSPHATE 10 MG/ML IJ SOLN
INTRAMUSCULAR | Status: DC | PRN
  Administered 2019-08-03: 5 mg via INTRAVENOUS

## 2019-08-03 MED ORDER — OXYCODONE HCL 5 MG PO TABS
5.0000 mg | ORAL_TABLET | ORAL | Status: DC | PRN
  Administered 2019-08-04 – 2019-08-05 (×4): 5 mg via ORAL
  Filled 2019-08-03 (×4): qty 1

## 2019-08-03 MED ORDER — KETAMINE HCL 50 MG/ML IJ SOLN
INTRAMUSCULAR | Status: DC | PRN
  Administered 2019-08-03 (×2): 25 mg via INTRAMUSCULAR

## 2019-08-03 MED ORDER — SUGAMMADEX SODIUM 200 MG/2ML IV SOLN
INTRAVENOUS | Status: DC | PRN
  Administered 2019-08-03: 130 mg via INTRAVENOUS

## 2019-08-03 MED ORDER — PHENYLEPHRINE HCL (PRESSORS) 10 MG/ML IV SOLN
INTRAVENOUS | Status: DC | PRN
  Administered 2019-08-03 (×9): 100 ug via INTRAVENOUS

## 2019-08-03 MED ORDER — SENNOSIDES-DOCUSATE SODIUM 8.6-50 MG PO TABS
1.0000 | ORAL_TABLET | Freq: Two times a day (BID) | ORAL | Status: DC
  Administered 2019-08-03 – 2019-08-05 (×4): 1 via ORAL
  Filled 2019-08-03 (×4): qty 1

## 2019-08-03 MED ORDER — DEXAMETHASONE SODIUM PHOSPHATE 10 MG/ML IJ SOLN
INTRAMUSCULAR | Status: AC
  Filled 2019-08-03: qty 1

## 2019-08-03 MED ORDER — CELECOXIB 200 MG PO CAPS
200.0000 mg | ORAL_CAPSULE | Freq: Two times a day (BID) | ORAL | Status: DC
  Administered 2019-08-03 – 2019-08-05 (×4): 200 mg via ORAL
  Filled 2019-08-03 (×4): qty 1

## 2019-08-03 MED ORDER — ONDANSETRON HCL 4 MG/2ML IJ SOLN
INTRAMUSCULAR | Status: DC | PRN
  Administered 2019-08-03: 4 mg via INTRAVENOUS

## 2019-08-03 MED ORDER — FLEET ENEMA 7-19 GM/118ML RE ENEM: 1.0000 | ENEMA | Freq: Once | RECTAL | Status: DC | PRN

## 2019-08-03 MED ORDER — ACETAMINOPHEN 10 MG/ML IV SOLN
1000.0000 mg | Freq: Four times a day (QID) | INTRAVENOUS | Status: AC
  Administered 2019-08-04 (×4): 1000 mg via INTRAVENOUS
  Filled 2019-08-03 (×4): qty 100

## 2019-08-03 MED ORDER — ONDANSETRON HCL 4 MG/2ML IJ SOLN: 4.0000 mg | Freq: Four times a day (QID) | INTRAMUSCULAR | Status: DC | PRN

## 2019-08-03 MED ORDER — ACETAMINOPHEN 10 MG/ML IV SOLN
INTRAVENOUS | Status: DC | PRN
  Administered 2019-08-03: 1000 mg via INTRAVENOUS

## 2019-08-03 MED ORDER — SUCCINYLCHOLINE CHLORIDE 20 MG/ML IJ SOLN
INTRAMUSCULAR | Status: DC | PRN
  Administered 2019-08-03: 100 mg via INTRAVENOUS

## 2019-08-03 MED ORDER — MAGNESIUM HYDROXIDE 400 MG/5ML PO SUSP
30.0000 mL | Freq: Every day | ORAL | Status: DC | PRN
  Administered 2019-08-05: 30 mL via ORAL
  Filled 2019-08-03: qty 30

## 2019-08-03 MED ORDER — FERROUS SULFATE 325 (65 FE) MG PO TABS
325.0000 mg | ORAL_TABLET | Freq: Two times a day (BID) | ORAL | Status: DC
  Administered 2019-08-04 – 2019-08-05 (×3): 325 mg via ORAL
  Filled 2019-08-03 (×3): qty 1

## 2019-08-03 MED ORDER — EPHEDRINE SULFATE 50 MG/ML IJ SOLN
INTRAMUSCULAR | Status: DC | PRN
  Administered 2019-08-03 (×2): 10 mg via INTRAVENOUS

## 2019-08-03 MED ORDER — PHENOL 1.4 % MT LIQD
1.0000 | OROMUCOSAL | Status: DC | PRN
  Filled 2019-08-03: qty 177

## 2019-08-03 MED ORDER — SUCCINYLCHOLINE CHLORIDE 20 MG/ML IJ SOLN
INTRAMUSCULAR | Status: AC
  Filled 2019-08-03: qty 1

## 2019-08-03 MED ORDER — METOCLOPRAMIDE HCL 5 MG/ML IJ SOLN: 5.0000 mg | Freq: Three times a day (TID) | INTRAMUSCULAR | Status: DC | PRN

## 2019-08-03 MED ORDER — ENOXAPARIN SODIUM 40 MG/0.4ML ~~LOC~~ SOLN
40.0000 mg | SUBCUTANEOUS | Status: DC
  Administered 2019-08-04 – 2019-08-05 (×2): 40 mg via SUBCUTANEOUS
  Filled 2019-08-03 (×2): qty 0.4

## 2019-08-03 MED ORDER — LIDOCAINE HCL (PF) 2 % IJ SOLN
INTRAMUSCULAR | Status: AC
  Filled 2019-08-03: qty 10

## 2019-08-03 MED ORDER — ROCURONIUM BROMIDE 100 MG/10ML IV SOLN
INTRAVENOUS | Status: DC | PRN
  Administered 2019-08-03: 20 mg via INTRAVENOUS
  Administered 2019-08-03: 5 mg via INTRAVENOUS
  Administered 2019-08-03: 20 mg via INTRAVENOUS
  Administered 2019-08-03: 10 mg via INTRAVENOUS

## 2019-08-03 MED ORDER — SODIUM CHLORIDE (PF) 0.9 % IJ SOLN
INTRAMUSCULAR | Status: AC
  Filled 2019-08-03: qty 30

## 2019-08-03 MED ORDER — FENTANYL CITRATE (PF) 100 MCG/2ML IJ SOLN
INTRAMUSCULAR | Status: DC | PRN
  Administered 2019-08-03 (×2): 50 ug via INTRAVENOUS

## 2019-08-03 MED ORDER — LACTATED RINGERS IV SOLN
INTRAVENOUS | Status: DC
  Administered 2019-08-03 (×2): via INTRAVENOUS

## 2019-08-03 MED ORDER — HYDROMORPHONE HCL 1 MG/ML IJ SOLN
INTRAMUSCULAR | Status: AC
  Filled 2019-08-03: qty 1

## 2019-08-03 MED ORDER — PROPOFOL 10 MG/ML IV BOLUS
INTRAVENOUS | Status: AC
  Filled 2019-08-03: qty 20

## 2019-08-03 MED ORDER — FENTANYL CITRATE (PF) 100 MCG/2ML IJ SOLN
INTRAMUSCULAR | Status: AC
  Filled 2019-08-03: qty 2

## 2019-08-03 MED ORDER — METOCLOPRAMIDE HCL 10 MG PO TABS: 5.0000 mg | ORAL_TABLET | Freq: Three times a day (TID) | ORAL | Status: DC | PRN

## 2019-08-03 MED ORDER — ACETAMINOPHEN 10 MG/ML IV SOLN
INTRAVENOUS | Status: AC
  Filled 2019-08-03: qty 100

## 2019-08-03 MED ORDER — SUGAMMADEX SODIUM 200 MG/2ML IV SOLN
INTRAVENOUS | Status: AC
  Filled 2019-08-03: qty 2

## 2019-08-03 SURGICAL SUPPLY — 60 items
BAG DECANTER FOR FLEXI CONT (MISCELLANEOUS) ×2 IMPLANT
BLADE SAW 90X25X1.19 OSCILLAT (BLADE) ×2 IMPLANT
CANISTER SUCT 1200ML W/VALVE (MISCELLANEOUS) ×2 IMPLANT
CANISTER SUCT 3000ML PPV (MISCELLANEOUS) ×4 IMPLANT
CEMENT HV SMART SET (Cement) ×4 IMPLANT
CEMENT RESTRICTOR DEPUY SZ 3 (Cement) ×1 IMPLANT
CENTRALIZER STEM HIP 12MM (Hips) ×1 IMPLANT
COVER BACK TABLE REUSABLE LG (DRAPES) ×2 IMPLANT
COVER WAND RF STERILE (DRAPES) ×2 IMPLANT
DRAPE 3/4 80X56 (DRAPES) ×2 IMPLANT
DRAPE INCISE IOBAN 66X60 STRL (DRAPES) ×2 IMPLANT
DRSG DERMACEA 8X12 NADH (GAUZE/BANDAGES/DRESSINGS) ×2 IMPLANT
DRSG OPSITE POSTOP 4X12 (GAUZE/BANDAGES/DRESSINGS) ×2 IMPLANT
DRSG OPSITE POSTOP 4X14 (GAUZE/BANDAGES/DRESSINGS) ×2 IMPLANT
DRSG TEGADERM 4X4.75 (GAUZE/BANDAGES/DRESSINGS) ×2 IMPLANT
DURAPREP 26ML APPLICATOR (WOUND CARE) ×4 IMPLANT
ELECT REM PT RETURN 9FT ADLT (ELECTROSURGICAL) ×2
ELECTRODE REM PT RTRN 9FT ADLT (ELECTROSURGICAL) ×1 IMPLANT
GAUZE PACK 2X3YD (GAUZE/BANDAGES/DRESSINGS) ×2 IMPLANT
GLOVE BIOGEL M STRL SZ7.5 (GLOVE) ×4 IMPLANT
GLOVE INDICATOR 8.0 STRL GRN (GLOVE) ×2 IMPLANT
GOWN STRL REUS W/ TWL LRG LVL3 (GOWN DISPOSABLE) ×2 IMPLANT
GOWN STRL REUS W/TWL LRG LVL3 (GOWN DISPOSABLE) ×2
HEAD FEM UNIPOLAR 46 OD STRL (Hips) ×1 IMPLANT
HEMOVAC 400CC 10FR (MISCELLANEOUS) ×2 IMPLANT
HOLDER FOLEY CATH W/STRAP (MISCELLANEOUS) ×2 IMPLANT
HOOD PEEL AWAY FLYTE STAYCOOL (MISCELLANEOUS) ×4 IMPLANT
IV NS 100ML SINGLE PACK (IV SOLUTION) ×2 IMPLANT
KIT TURNOVER KIT A (KITS) ×2 IMPLANT
MANIFOLD NEPTUNE II (INSTRUMENTS) ×2 IMPLANT
NDL FILTER BLUNT 18X1 1/2 (NEEDLE) ×1 IMPLANT
NDL SAFETY ECLIPSE 18X1.5 (NEEDLE) ×1 IMPLANT
NEEDLE FILTER BLUNT 18X 1/2SAF (NEEDLE) ×1
NEEDLE FILTER BLUNT 18X1 1/2 (NEEDLE) ×1 IMPLANT
NEEDLE HYPO 18GX1.5 SHARP (NEEDLE) ×1
NS IRRIG 1000ML POUR BTL (IV SOLUTION) ×2 IMPLANT
PACK HIP PROSTHESIS (MISCELLANEOUS) ×2 IMPLANT
PENCIL SMOKE ULTRAEVAC 22 CON (MISCELLANEOUS) ×2 IMPLANT
PRESSURIZER CEMENT PROX FEM SM (MISCELLANEOUS) ×2 IMPLANT
PRESSURIZER FEM CANAL M (MISCELLANEOUS) ×2 IMPLANT
PULSAVAC PLUS IRRIG FAN TIP (DISPOSABLE) ×2
SOL .9 NS 3000ML IRR  AL (IV SOLUTION) ×1
SOL .9 NS 3000ML IRR UROMATIC (IV SOLUTION) ×1 IMPLANT
SOL PREP PVP 2OZ (MISCELLANEOUS) ×2
SOLUTION PREP PVP 2OZ (MISCELLANEOUS) ×1 IMPLANT
SPACER FEM TAPERED +0 12/14 (Hips) ×1 IMPLANT
SPONGE DRAIN TRACH 4X4 STRL 2S (GAUZE/BANDAGES/DRESSINGS) ×2 IMPLANT
STAPLER SKIN PROX 35W (STAPLE) ×2 IMPLANT
STEM SUMMIT CEMENTED BASIC (Hips) ×1 IMPLANT
SUT ETHIBOND #5 BRAIDED 30INL (SUTURE) ×2 IMPLANT
SUT VIC AB 0 CT1 36 (SUTURE) ×2 IMPLANT
SUT VIC AB 1 CT1 36 (SUTURE) ×4 IMPLANT
SUT VIC AB 2-0 CT1 27 (SUTURE) ×1
SUT VIC AB 2-0 CT1 TAPERPNT 27 (SUTURE) ×1 IMPLANT
SYR 20ML LL LF (SYRINGE) ×2 IMPLANT
SYR TB 1ML 27GX1/2 LL (SYRINGE) ×2 IMPLANT
TAPE TRANSPORE STRL 2 31045 (GAUZE/BANDAGES/DRESSINGS) ×2 IMPLANT
TIP BRUSH PULSAVAC PLUS 24.33 (MISCELLANEOUS) ×2 IMPLANT
TIP FAN IRRIG PULSAVAC PLUS (DISPOSABLE) ×1 IMPLANT
TOWER CARTRIDGE SMART MIX (DISPOSABLE) ×2 IMPLANT

## 2019-08-03 NOTE — Progress Notes (Signed)
[facility] [place] NAME: Adylynn Gans    MR#:  [id number]  DATE OF BIRTH:  28-Nov-1945  SUBJECTIVE:   Patient presented to the hospital after a mechanical fall and noted to have a right hip fracture.  Plan for hip surgery later today.  Patient feeling a bit nauseated today.   REVIEW OF SYSTEMS:    Review of Systems  Constitutional: Negative for chills and fever.  HENT: Negative for congestion and tinnitus.   Eyes: Negative for blurred vision and double vision.  Respiratory: Negative for cough, shortness of breath and wheezing.   Cardiovascular: Negative for chest pain, orthopnea and PND.  Gastrointestinal: Positive for nausea. Negative for abdominal pain, diarrhea and vomiting.  Genitourinary: Negative for dysuria and hematuria.  Musculoskeletal: Positive for joint pain (Right hip).  Neurological: Negative for dizziness, sensory change and focal weakness.  All other systems reviewed and are negative.   Nutrition: NPO for surgery Tolerating Diet: No Tolerating PT: Await Eval    DRUG ALLERGIES:   Allergies  Allergen Reactions  . Flagyl [Metronidazole] Nausea And Vomiting  . Biaxin [Clarithromycin] Other (See Comments)    Reaction: stomach cramps  . Erythromycin Other (See Comments)    Reaction: stomach cramps    VITALS:  Blood pressure (!) 149/66, pulse 99, temperature 98.8 F (37.1 C), temperature source Oral, resp. rate 18, height 5\' 3"  (1.6 m), weight 63 kg, SpO2 97 %.  PHYSICAL EXAMINATION:   Physical Exam  GENERAL:  73 y.o.-year-old patient lying in bed in no acute distress.  EYES: Pupils equal, round, reactive to light and accommodation. No scleral icterus. Extraocular muscles intact.  HEENT: Head atraumatic, normocephalic. Oropharynx and nasopharynx clear.  NECK:  Supple, no jugular venous distention. No thyroid enlargement, no tenderness.  LUNGS: Normal breath sounds bilaterally, no wheezing, rales, rhonchi. No  use of accessory muscles of respiration.  CARDIOVASCULAR: S1, S2 normal. No murmurs, rubs, or gallops.  ABDOMEN: Soft, nontender, nondistended. Bowel sounds present. No organomegaly or mass.  EXTREMITIES: No cyanosis, clubbing or edema b/l.  Right lower ext. Shortened and ext. Rotated.   NEUROLOGIC: Cranial nerves II through XII are intact. No focal Motor or sensory deficits b/l.   PSYCHIATRIC: The patient is alert and oriented x 3.  SKIN: No obvious rash, lesion, or ulcer.    LABORATORY PANEL:   CBC Recent Labs  Lab 08/03/19 0525  WBC 8.2  HGB 11.3*  HCT 33.9*  PLT 260   ------------------------------------------------------------------------------------------------------------------  Chemistries  Recent Labs  Lab 08/02/19 1921 08/03/19 0525  NA 134* 132*  K 3.7 3.5  CL 99 97*  CO2 26 26  GLUCOSE 110* 116*  BUN 22 18  CREATININE 0.84 0.69  CALCIUM 9.5 8.8*  AST 30  --   ALT 21  --   ALKPHOS 50  --   BILITOT 0.5  --    ------------------------------------------------------------------------------------------------------------------  Cardiac Enzymes No results for input(s): TROPONINI in the last 168 hours. ------------------------------------------------------------------------------------------------------------------  RADIOLOGY:  Dg Chest 1 View  Result Date: 08/02/2019 CLINICAL DATA:  Fall, hip fracture EXAM: CHEST  1 VIEW COMPARISON:  11/02/2015 FINDINGS: The heart size and mediastinal contours are within normal limits. Both lungs are clear. The visualized skeletal structures are unremarkable. IMPRESSION: No acute abnormality of the lungs in AP portable projection. Electronically Signed   By: Eddie Candle M.D.   On: 08/02/2019 20:38   Ct Hip Right Wo Contrast  Result Date: 08/02/2019 CLINICAL DATA:  Hip trauma question of fracture EXAM: CT OF THE RIGHT HIP WITHOUT CONTRAST TECHNIQUE: Multidetector CT imaging of the right hip was performed according to the  standard protocol. Multiplanar CT image reconstructions were also generated. COMPARISON:  None. FINDINGS: Bones/Joint/Cartilage There is an impacted nondisplaced fracture seen through the femoral neck just at the head neck junction. The femoral head is still well seated within the acetabulum. No other fracture is identified. There is mild osteoarthritis with superior joint space loss and subchondral sclerosis. Ligaments Suboptimally assessed by CT. Muscles and Tendons Mild fatty atrophy of the muscles surrounding the hip is seen. The tendons appear to be grossly intact. Soft tissues Mild surrounding soft tissue swelling is seen. The visualized portion of the deep pelvis is unremarkable. IMPRESSION: Nondisplaced impacted femoral neck fracture. Electronically Signed   By: Prudencio Pair M.D.   On: 08/02/2019 20:58   Dg Hip Unilat W Or Wo Pelvis 2-3 Views Right  Result Date: 08/02/2019 CLINICAL DATA:  Right hip pain after fall. EXAM: DG HIP (WITH OR WITHOUT PELVIS) 2-3V RIGHT COMPARISON:  Report from left hip radiograph 01/25/2016, images not available. FINDINGS: Impacted fracture of the right femoral neck. No significant angulation or proximal migration of the femoral shaft. Femoral head is well seated in the acetabulum with mild degenerative change. Pubic symphysis and sacroiliac joints are congruent. Pubic rami are intact. Surgical hardware in the lower lumbar spine is partially included. IMPRESSION: Impacted right femoral neck fracture. Electronically Signed   By: Keith Rake M.D.   On: 08/02/2019 19:25     ASSESSMENT AND PLAN:   73 year old female with past medical history of hypertension, hyperlipidemia, anxiety, osteoarthritis who presented to the hospital after a mechanical fall noted to have right hip fracture.  1.  Status post fall and right hip fracture- presents to the hospital after a mechanical fall. - Patient is a low to moderate risk for noncardiac surgery.  Plan for surgery later this  evening. -Continue further care as per orthopedics.  2.  Essential hypertension-continue losartan.  3.  Neuropathy-continue gabapentin.  4.  Anxiety/depression-continue Cymbalta.  5.  GERD-continue Protonix.  6.  Hyperlipidemia-continue simvastatin.   All the records are reviewed and case discussed with Care Management/Social Worker. Management plans discussed with the patient, family and they are in agreement.  CODE STATUS: Full code  DVT Prophylaxis: Lovenox post-operatively.   TOTAL TIME TAKING CARE OF THIS PATIENT: 30 minutes.   POSSIBLE D/C IN 2-3 DAYS, DEPENDING ON CLINICAL CONDITION.   Henreitta Leber M.D on 08/03/2019 at 1:23 PM  Between 7am to 6pm - Pager - [phone]  After 6pm go to [email or web address] - Proofreader  [facility]  Office  [phone]  CC: Primary care physician; Derinda Late, MD

## 2019-08-03 NOTE — Op Note (Signed)
OPERATIVE NOTE  DATE OF SURGERY:  08/03/2019  PATIENT NAME:  Brittney Ramsey   DOB: 1946-04-13  MRN: [id number]  PRE-OPERATIVE DIAGNOSIS: Right femoral neck fracture  POST-OPERATIVE DIAGNOSIS:  Same  PROCEDURE:  Right hip hemiarthroplasty  SURGEON:  Marciano Sequin. M.D.  FIRST ASSISTANT: Cassell Smiles, PA-C  ANESTHESIA: general  ESTIMATED BLOOD LOSS: 100 mL  FLUIDS REPLACED: 1300 mL of crystalloid  DRAINS: 2 medium drains to a Hemovac reservoir  IMPLANTS UTILIZED: DePuy size 5 Summit femoral stem (cemented), 12 mm Cementralizer, 46 mm OD Cathcart hip ball, +0 mm tapered spacer, and a size 3 femoral cement restrictor  INDICATIONS FOR SURGERY: Brittney Ramsey is a 73 y.o. year old female who fell and sustained a displaced right femoral neck fracture. After discussion of the risks and benefits of surgical intervention, the patient expressed understanding of the risks benefits and agree with plans for hip hemiarthroplasty.   The risks, benefits, and alternatives were discussed at length including but not limited to the risks of infection, bleeding, nerve injury, stiffness, blood clots, the need for revision surgery, limb length inequality, dislocation, cardiopulmonary complications, among others, and they were willing to proceed.  PROCEDURE IN DETAIL: The patient was brought into the operating room and, after adequate general anesthesia was achieved, patient was placed in a left lateral decubitus position. Axillary roll was placed and all bony prominences were well-padded. The patient's right hip was cleaned and prepped with alcohol and DuraPrep and draped in the usual sterile fashion. A "timeout" was performed as per usual protocol. A lateral curvilinear incision was made gently curving towards the posterior superior iliac spine. The IT band was incised in line with the skin incision and the fibers of the gluteus maximus were split in line. The piriformis tendon was identified,  skeletonized, and incised at its insertion to the proximal femur and reflected posteriorly. A T type posterior capsulotomy was performed. The femoral head was then removed using a corkscrew device. The femoral head was measured using calipers and ring gauges and determined to be 46 mm in diameter.The femoral neck cut was performed using an oscillating saw. The acetabulum was inspected for any bony fragments. The articular surface was in good condition.  Attention was then directed to the proximal femur. A pilot hole for preparation of the proximal femoral canal was created using a high-speed bur. The femoral canal finder was inserted followed by insertion of the conical reamer. Serial broaches were inserted up to a size 5 broach. Calcar region was planed and a trial reduction was performed using a 46 mm OD Cathcart ball with a +0 mm neck length. Good equalization of limb lengths was appreciated and excellent stability was noted both anteriorly and posteriorly. Trial components were removed. The femoral canal was sized and was felt that a size 3 cement restrictor was appropriate. The cement restrictor was inserted to the appropriate depth in the femoral canal was irrigated with copious amounts of fluid using the pulse lavage and suctioned dry. The femoral canal was then packed with vaginal packing soaked in dilute Neo-Synephrine. Polymethylmethacrylate cement was prepared in the usual fashion using a vacuum mixer. Vaginal packing was removed and the canal again irrigated and suctioned dry. The polymethylmethacrylate cement was inserted in retrograde fashion and pressurized. The size 5 Summit femoral component with a 12 mm Cementralizer was positioned and impacted into place. Excess cement was removed using [employer] fast streamer. After adequate curing of the cement, the Morse taper was cleaned and dried.  A 46 mm outer diameter Cathcart hip ball with a +0 mm tapered spacer was placed on the trunnion and impacted into  place. The acetabulum was again irrigated and suctioned dry, making sure to inspect for any residal bony debris. The femoral head was then reduced and placed through a range of motion. Excellent stability was noted both anteriorly and posteriorly. Good equalization of limb lengths was appreciated.   The wound was irrigated with copious amounts of normal saline with antibiotic solution and suctioned dry. Good hemostasis was appreciated. The posterior capsulotomy was repaired using #5 Ethibond. Piriformis tendon was reapproximated to the undersurface of the gluteus medius tendon using #5 Ethibond. Two medium drains were placed in the wound bed and brought out through separate stab incisions to be attached to a Hemovac reservoir. The IT band was reapproximated using interrupted sutures of #1 Vicryl. Subcutaneous tissue was proximal phalanx using first #0 Vicryl followed by #2-0 Vicryl. The skin was closed with skin staples.  The patient tolerated the procedure well and was transported to the recovery room in stable condition.   Marciano Sequin., M.D.

## 2019-08-03 NOTE — Anesthesia Post-op Follow-up Note (Signed)
Anesthesia QCDR form completed.        

## 2019-08-03 NOTE — NC FL2 (Signed)
[place] LEVEL OF CARE SCREENING TOOL     IDENTIFICATION  Patient Name: Brittney Ramsey Birthdate: [birth year] Sex: female Admission Date (Current Location): 08/02/2019  [place] and [place] Number:  Engineering geologist and Address:  [facility], [address], [place], [place] [address]      Provider Number: [id number]  Attending Physician Name and Address:  Henreitta Leber, MD  Relative Name and Phone Number:       Current Level of Care: Hospital Recommended Level of Care: [place] Prior Approval Number:    Date Approved/Denied:   PASRR Number: [id number]  Discharge Plan: SNF    Current Diagnoses: Patient Active Problem List   Diagnosis Date Noted  . HTN (hypertension) 08/02/2019  . HLD (hyperlipidemia) 08/02/2019  . Hip fracture, right ([place]) 08/02/2019  . Hip fracture ([place]) 08/02/2019  . Spondylolisthesis of lumbar region 10/20/2018  . Hyponatremia 11/02/2015    Orientation RESPIRATION BLADDER Height & Weight     Self, Time, Situation, Place  Normal Continent Weight: 139 lb (63 kg) Height:  5\' 3"  (160 cm)  BEHAVIORAL SYMPTOMS/MOOD NEUROLOGICAL BOWEL NUTRITION STATUS      Continent Diet(Diet: NPO for surgery to be advanced.)  AMBULATORY STATUS COMMUNICATION OF NEEDS Skin   Extensive Assist Verbally Surgical wounds                       Personal Care Assistance Level of Assistance  Bathing, Feeding, Dressing Bathing Assistance: Limited assistance Feeding assistance: Independent Dressing Assistance: Limited assistance     Functional Limitations Info  Sight, Hearing, Speech Sight Info: Adequate Hearing Info: Adequate Speech Info: Adequate    SPECIAL CARE FACTORS FREQUENCY  PT (By licensed PT), OT (By licensed OT)     PT Frequency: 5 OT Frequency: 5            Contractures      Additional Factors Info  Code Status, Allergies Code Status Info: Full Code. Allergies Info: Flagyl  Metronidazole, Biaxin, Clarithromycin, Erythromycin           Current Medications (08/03/2019):  This is the current hospital active medication list Current Facility-Administered Medications  Medication Dose Route Frequency Provider Last Rate Last Dose  . acetaminophen (TYLENOL) tablet 650 mg  650 mg Oral Q6H PRN Lance Coon, MD       Or  . acetaminophen (TYLENOL) suppository 650 mg  650 mg Rectal Q6H PRN Lance Coon, MD      . DULoxetine (CYMBALTA) DR capsule 60 mg  60 mg Oral BID Lance Coon, MD   60 mg at 08/03/19 0931  . fluticasone (FLONASE) 50 MCG/ACT nasal spray 2 spray  2 spray Each Nare Daily PRN Lance Coon, MD      . gabapentin (NEURONTIN) capsule 100 mg  100 mg Oral BID Lance Coon, MD   100 mg at 08/03/19 0931  . HYDROmorphone (DILAUDID) injection 0.5 mg  0.5 mg Intravenous Q4H PRN Lance Coon, MD      . LORazepam (ATIVAN) tablet 0.5 mg  0.5 mg Oral Corwin Levins, MD   0.5 mg at 08/02/19 2330  . losartan (COZAAR) tablet 50 mg  50 mg Oral Daily Lance Coon, MD   50 mg at 08/03/19 0931  . montelukast (SINGULAIR) tablet 10 mg  10 mg Oral Daily Lance Coon, MD      . mupirocin ointment (BACTROBAN) 2 % 1 application  1 application Nasal BID Hessie Knows, MD      .  ondansetron (ZOFRAN) tablet 4 mg  4 mg Oral Q6H PRN Lance Coon, MD       Or  . ondansetron [facility]) injection 4 mg  4 mg Intravenous Q6H PRN Lance Coon, MD   4 mg at 08/03/19 0423  . oxyCODONE (Oxy IR/ROXICODONE) immediate release tablet 5 mg  5 mg Oral Q4H PRN Lance Coon, MD   5 mg at 08/03/19 0446  . pantoprazole (PROTONIX) EC tablet 40 mg  40 mg Oral Daily Lance Coon, MD   40 mg at 08/03/19 0931  . promethazine (PHENERGAN) injection 12.5 mg  12.5 mg Intravenous Q6H PRN Henreitta Leber, MD   12.5 mg at 08/03/19 1024  . simvastatin (ZOCOR) tablet 20 mg  20 mg Oral q1800 Lance Coon, MD         Discharge Medications: Please see discharge summary for a list of discharge  medications.  Relevant Imaging Results:  Relevant Lab Results:   Additional Information SSN: [id number]  Anjel Perfetti, Veronia Beets, LCSW

## 2019-08-03 NOTE — Anesthesia Preprocedure Evaluation (Addendum)
Anesthesia Evaluation  Patient identified by MRN, date of birth, ID band Patient awake    Reviewed: Allergy & Precautions, H&P , NPO status , Patient's Chart, lab work & pertinent test results  History of Anesthesia Complications (+) PONV and history of anesthetic complications  Airway Mallampati: II  TM Distance: >3 FB Neck ROM: limited    Dental  (+) Chipped, Caps   Pulmonary neg COPD, former smoker,           Cardiovascular hypertension, (-) angina(-) Past MI and (-) Cardiac Stents (-) dysrhythmias      Neuro/Psych PSYCHIATRIC DISORDERS Anxiety negative neurological ROS     GI/Hepatic negative GI ROS, Neg liver ROS,   Endo/Other  negative endocrine ROS  Renal/GU      Musculoskeletal   Abdominal   Peds  Hematology negative hematology ROS (+)   Anesthesia Other Findings Past Medical History: No date: Allergic rhinitis No date: Allergy No date: Anxiety No date: Arthritis No date: Complication of anesthesia No date: Hyperlipemia No date: Hypertension No date: PONV (postoperative nausea and vomiting)   Reproductive/Obstetrics negative OB ROS                            Anesthesia Physical Anesthesia Plan  ASA: II  Anesthesia Plan: General ETT and Rapid Sequence   Post-op Pain Management:    Induction:   PONV Risk Score and Plan: Ondansetron, Dexamethasone and Treatment may vary due to age or medical condition  Airway Management Planned:   Additional Equipment:   Intra-op Plan:   Post-operative Plan:   Informed Consent: I have reviewed the patients History and Physical, chart, labs and discussed the procedure including the risks, benefits and alternatives for the proposed anesthesia with the patient or authorized representative who has indicated his/her understanding and acceptance.     Dental Advisory Given  Plan Discussed with: Anesthesiologist and CRNA  Anesthesia  Plan Comments: (H/o spinal fusion at L3-4 and L4-5, plan GETA. Vomiting today, plan RSI)        Anesthesia Quick Evaluation

## 2019-08-03 NOTE — Progress Notes (Signed)
Tele D/C'd. Monitor removed from pt.

## 2019-08-03 NOTE — Anesthesia Procedure Notes (Addendum)
Procedure Name: Intubation Date/Time: 08/03/2019 5:56 PM Performed by: Dionne Bucy, CRNA Pre-anesthesia Checklist: Patient identified, Patient being monitored, Timeout performed, Emergency Drugs available and Suction available Patient Re-evaluated:Patient Re-evaluated prior to induction Oxygen Delivery Method: Circle system utilized Preoxygenation: Pre-oxygenation with 100% oxygen Induction Type: IV induction and Rapid sequence Ventilation: Mask ventilation without difficulty Laryngoscope Size: 3 and McGraph Grade View: Grade II Tube type: Oral Tube size: 7.0 mm Number of attempts: 1 Airway Equipment and Method: Stylet Placement Confirmation: ETT inserted through vocal cords under direct vision,  positive ETCO2 and breath sounds checked- equal and bilateral Secured at: 21 cm Tube secured with: Tape Dental Injury: Teeth and Oropharynx as per pre-operative assessment

## 2019-08-03 NOTE — Transfer of Care (Signed)
Immediate Anesthesia Transfer of Care Note  Patient: Brittney Ramsey  Procedure(s) Performed: ARTHROPLASTY BIPOLAR HIP (HEMIARTHROPLASTY) (Right Hip)  Patient Location: PACU  Anesthesia Type:General  Level of Consciousness: sedated and patient cooperative  Airway & Oxygen Therapy: Patient Spontanous Breathing and Patient connected to face mask oxygen  Post-op Assessment: Report given to RN and Post -op Vital signs reviewed and stable  Post vital signs: Reviewed and stable  Last Vitals:  Vitals Value Taken Time  BP 150/84 08/03/19 2102  Temp    Pulse 95 08/03/19 2103  Resp 17 08/03/19 2103  SpO2 80 % 08/03/19 2103  Vitals shown include unvalidated device data.  Last Pain:  Vitals:   08/03/19 1548  TempSrc: Oral  PainSc:       Patients Stated Pain Goal: 2 ([id number])  Complications: No apparent anesthesia complications

## 2019-08-03 NOTE — TOC Initial Note (Signed)
Transition of Care [facility]) - Initial/Assessment Note    Patient Details  Name: Brittney Ramsey MRN: [id number] Date of Birth: January 12, 1946  [facility] CM/SW Contact:    Jameya Pontiff, [name] Phone Number: [phone]  08/03/2019, 11:20 AM  Clinical Narrative: Clinical Social Worker (CSW) reviewed chart and noted that patient has a hip fracture. Surgery and PT are pending. CSW met with patient alone at bedside today to discuss D/C plan. Patient was alert and oriented X4 and was laying in the bed. Patient reported that she is feeling nauseous and tired. CSW provided emotional support and explained role of CSW department. Per patient she lives in [place] with her husband Alveta Heimlich. Per patient her husband is disabled and can't walker and she is the primary caregiver for him. Per patient she has 3 adult children and her daughter [name] is her HPOA. Per patient her daughter is taking care of her husband while patient is in the hospital. CSW explained that after surgery PT will evaluate her and make a recommendation of home health or SNF. Patient is open to SNF if needed. CSW provided patient CMS SNF list. Patient is agreeable to SNF search in [place]. CSW explained that Health Team will have to approve SNF. Patient verbalized her understanding. FL2 complete and faxed out. CSW will continue to follow and assist as needed.        Expected Discharge Plan: Skilled Nursing Facility Barriers to Discharge: Continued Medical Work up   Patient Goals and CMS Choice Patient states their goals for this hospitalization and ongoing recovery are:: To feel better. CMS Medicare.gov Compare Post Acute Care list provided to:: Patient Choice offered to / list presented to : Patient  Expected Discharge Plan and Services Expected Discharge Plan: [place] In-house Referral: Clinical Social Work   Post Acute Care Choice: [place] Living arrangements for the past 2 months:  [place] Expected Discharge Date: 08/07/19                                    Prior Living Arrangements/Services Living arrangements for the past 2 months: Single Family Home Lives with:: Spouse Patient language and need for interpreter reviewed:: No Do you feel safe going back to the place where you live?: Yes      Need for Family Participation in Patient Care: Yes (Comment) Care giver support system in place?: No (comment)   Criminal Activity/Legal Involvement Pertinent to Current Situation/Hospitalization: No - Comment as needed  Activities of Daily Living Home Assistive Devices/Equipment: Gilford Rile (specify type) ADL Screening (condition at time of admission) Patient's cognitive ability adequate to safely complete daily activities?: Yes Is the patient deaf or have difficulty hearing?: No Does the patient have difficulty seeing, even when wearing glasses/contacts?: No Does the patient have difficulty concentrating, remembering, or making decisions?: No Patient able to express need for assistance with ADLs?: Yes Does the patient have difficulty dressing or bathing?: Yes Independently performs ADLs?: Yes (appropriate for developmental age) Does the patient have difficulty walking or climbing stairs?: Yes Weakness of Legs: Both Weakness of Arms/Hands: None  Permission Sought/Granted Permission sought to share information with : Facility Art therapist granted to share information with : Yes, Verbal Permission Granted              Emotional Assessment Appearance:: Appears stated age Attitude/Demeanor/Rapport: Engaged Affect (typically observed): Pleasant, Calm Orientation: :  Oriented to Self, Oriented to Place, Oriented to  Time, Oriented to Situation Alcohol / Substance Use: Not Applicable Psych Involvement: No (comment)  Admission diagnosis:  Closed fracture of neck of right femur, initial encounter ([place]) [S72.001A] Fall, initial  encounter [W19.XXXA] Patient Active Problem List   Diagnosis Date Noted  . HTN (hypertension) 08/02/2019  . HLD (hyperlipidemia) 08/02/2019  . Hip fracture, right ([place]) 08/02/2019  . Hip fracture ([place]) 08/02/2019  . Spondylolisthesis of lumbar region 10/20/2018  . Hyponatremia 11/02/2015   PCP:  Derinda Late, MD Pharmacy:   [facility], [place] [address] Phone: [phone] Fax: [phone]     Social Determinants of Health (SDOH) Interventions    Readmission Risk Interventions No flowsheet data found.

## 2019-08-03 NOTE — Plan of Care (Signed)

## 2019-08-04 ENCOUNTER — Encounter: Payer: Self-pay | Admitting: Orthopedic Surgery

## 2019-08-04 LAB — BASIC METABOLIC PANEL
Anion gap: 8 (ref 5–15)
BUN: 16 mg/dL (ref 8–23)
CO2: 24 mmol/L (ref 22–32)
Calcium: 8.1 mg/dL — ABNORMAL LOW (ref 8.9–10.3)
Chloride: 100 mmol/L (ref 98–111)
Creatinine, Ser: 0.93 mg/dL (ref 0.44–1.00)
GFR calc Af Amer: >60 mL/min (ref >60)
GFR calc non Af Amer: >60 mL/min (ref >60)
Glucose, Bld: 150 mg/dL — ABNORMAL HIGH (ref 70–99)
Potassium: 4.5 mmol/L (ref 3.5–5.1)
Sodium: 132 mmol/L — ABNORMAL LOW (ref 135–145)

## 2019-08-04 LAB — CBC
HCT: 27.5 % — ABNORMAL LOW (ref 36.0–46.0)
Hemoglobin: 9.2 g/dL — ABNORMAL LOW (ref 12.0–15.0)
MCH: 31.3 pg (ref 26.0–34.0)
MCHC: 33.5 g/dL (ref 30.0–36.0)
MCV: 93.5 fL (ref 80.0–100.0)
Platelets: 212 10*3/uL (ref 150–400)
RBC: 2.94 MIL/uL — ABNORMAL LOW (ref 3.87–5.11)
RDW: 13.9 % (ref 11.5–15.5)
WBC: 8.9 10*3/uL (ref 4.0–10.5)
nRBC: 0 % (ref 0.0–0.2)

## 2019-08-04 NOTE — Progress Notes (Signed)
OT Cancellation Note  Patient Details Name: Brittney Ramsey MRN: [id number] DOB: [birth year]   Cancelled Treatment:    Reason Eval/Treat Not Completed: Patient at procedure or test/ unavailable(Pt. in nursing care during first attempt this for OT this a.m. Will reattempt at a later time today.)  Harrel Carina, MS, OTR/L 08/04/2019, 10:35 AM

## 2019-08-04 NOTE — TOC Progression Note (Addendum)
Transition of Care [facility]) - Progression Note    Patient Details  Name: Brittney Ramsey MRN: [id number] Date of Birth: [birth year]  Transition of Care [facility]) CM/SW Contact  Deadrian Toya, [name] Phone Number: [phone]  08/04/2019, 2:01 PM  Clinical Narrative: PT is recommending SNF. Clinical Social Worker (CSW) met with patient to present SNF bed offers. Patient reported that she needs to go home because her husband has nobody to stay with him. Patient gave CSW permission to call her daughter [name] to discuss D/C plan. CSW attempted to contact patient's daughter [name] however she did not answer and a voicemail was left. Patient reported that she will consider SNF. CSW presented SNF bed offers to patient. [facility] SNF authorization started. Per patient she has a rolling walker and bedside commode at home. Patient reported that she does not have a home health agency preference. CSW asked Helene Kelp [facility] home health agency representative to review referral. Lovenox price requested. CSW will continue to follow and assist as needed.    2:34 pm: [facility] accepted referral for home health. Patient reported that she will go to [facility] if needed. Health Team case manager is aware of above. Plan is for patient to D/C to [facility] or home with [facility] home health depending on her progress with PT.     Expected Discharge Plan: [place] Barriers to Discharge: Continued Medical Work up  Expected Discharge Plan and Services Expected Discharge Plan: [place] In-house Referral: Clinical Social Work   Post Acute Care Choice: [place] Living arrangements for the past 2 months: Single Family Home Expected Discharge Date: 08/07/19                                     Social Determinants of Health (SDOH) Interventions    Readmission Risk Interventions No flowsheet data found.

## 2019-08-04 NOTE — Progress Notes (Signed)
   Subjective: 1 Day Post-Op Procedure(s) (LRB): ARTHROPLASTY BIPOLAR HIP (HEMIARTHROPLASTY) (Right) Patient reports pain as mild.   Patient is well, and has had no acute complaints or problems We will start therapy today.  Plan is to go Rehab after hospital stay. no nausea and no vomiting Patient denies any chest pains or shortness of breath.  Resting well and voicing no complaints  Objective: Vital signs in last 24 hours: Temp:  [97.1 F (36.2 C)-99.3 F (37.4 C)] 98.7 F (37.1 C) (09/22 0252) Pulse Rate:  [32-99] 88 (09/22 0252) Resp:  [11-20] 18 (09/22 0252) BP: (109-158)/(55-88) 109/55 (09/22 0252) SpO2:  [90 %-100 %] 93 % (09/22 0252) well approximated incision Heels are non tender and elevated off the bed using rolled towels Intake/Output from previous day: 09/21 0701 - 09/22 0700 In: 1893.6 [I.V.:1693.6; IV Piggyback:200] Out: 2110 [Urine:1970; Drains:140] Intake/Output this shift: No intake/output data recorded.  Recent Labs    08/02/19 1921 08/03/19 0525 08/04/19 0450  HGB 12.3 11.3* 9.2*   Recent Labs    08/03/19 0525 08/04/19 0450  WBC 8.2 8.9  RBC 3.62* 2.94*  HCT 33.9* 27.5*  PLT 260 212   Recent Labs    08/03/19 0525 08/04/19 0450  NA 132* 132*  K 3.5 4.5  CL 97* 100  CO2 26 24  BUN 18 16  CREATININE 0.69 0.93  GLUCOSE 116* 150*  CALCIUM 8.8* 8.1*   No results for input(s): LABPT, INR in the last 72 hours.  EXAM General - Patient is Alert, Appropriate and Oriented Extremity - Neurologically intact Neurovascular intact Sensation intact distally Intact pulses distally Dorsiflexion/Plantar flexion intact No cellulitis present Compartment soft Dressing - dressing C/D/I Motor Function - intact, moving foot and toes well on exam.    Past Medical History:  Diagnosis Date  . Allergic rhinitis   . Allergy   . Anxiety   . Arthritis   . Complication of anesthesia   . Hyperlipemia   . Hypertension   . PONV (postoperative nausea and  vomiting)     Assessment/Plan: 1 Day Post-Op Procedure(s) (LRB): ARTHROPLASTY BIPOLAR HIP (HEMIARTHROPLASTY) (Right) Principal Problem:   Hip fracture, right (HCC) Active Problems:   HTN (hypertension)   HLD (hyperlipidemia)   Hip fracture (HCC)  Estimated body mass index is 24.62 kg/m as calculated from the following:   Height as of this encounter: 5\' 3"  (1.6 m).   Weight as of this encounter: 63 kg. Advance diet Up with therapy D/C IV fluids Discharge to SNF  Labs: Hemoglobin 9.2 DVT Prophylaxis - Lovenox, TED hose and SCD Weight-Bearing as tolerated to right leg D/C O2 and Pulse OX and try on Room Air Begin working on bowel movement We will need to continue Lovenox 40 mg daily for 2 weeks at time of discharge Follow-up [facility] in 2 weeks  Wille Glaser R. [place] 08/04/2019, 7:33 AM

## 2019-08-04 NOTE — Progress Notes (Signed)
[place] [facility] [place] NAME: Brittney Ramsey    MR#:  [id number]  DATE OF BIRTH:  10-23-1946  SUBJECTIVE:   Patient is status post right hip hemiarthroplasty postop day #1 today.  No acute events overnight, still having some right hip pain.  REVIEW OF SYSTEMS:    Review of Systems  Constitutional: Negative for chills and fever.  HENT: Negative for congestion and tinnitus.   Eyes: Negative for blurred vision and double vision.  Respiratory: Negative for cough, shortness of breath and wheezing.   Cardiovascular: Negative for chest pain, orthopnea and PND.  Gastrointestinal: Negative for abdominal pain, diarrhea, nausea and vomiting.  Genitourinary: Negative for dysuria and hematuria.  Musculoskeletal: Positive for joint pain (Right hip).  Neurological: Negative for dizziness, sensory change and focal weakness.  All other systems reviewed and are negative.   Nutrition: Regular Tolerating Diet: Yes Tolerating PT: Await Eval.     DRUG ALLERGIES:   Allergies  Allergen Reactions   Flagyl [Metronidazole] Nausea And Vomiting   Biaxin [Clarithromycin] Other (See Comments)    Reaction: stomach cramps   Erythromycin Other (See Comments)    Reaction: stomach cramps    VITALS:  Blood pressure (!) 128/56, pulse 86, temperature 98.3 F (36.8 C), resp. rate 17, height 5\' 3"  (1.6 m), weight 63 kg, SpO2 100 %.  PHYSICAL EXAMINATION:   Physical Exam  GENERAL:  73 y.o.-year-old patient lying in bed in no acute distress.  EYES: Pupils equal, round, reactive to light and accommodation. No scleral icterus. Extraocular muscles intact.  HEENT: Head atraumatic, normocephalic. Oropharynx and nasopharynx clear.  NECK:  Supple, no jugular venous distention. No thyroid enlargement, no tenderness.  LUNGS: Normal breath sounds bilaterally, no wheezing, rales, rhonchi. No use of accessory muscles of respiration.  CARDIOVASCULAR: S1, S2 normal. No  murmurs, rubs, or gallops.  ABDOMEN: Soft, nontender, nondistended. Bowel sounds present. No organomegaly or mass.  EXTREMITIES: No cyanosis, clubbing or edema b/l.  Right Hip dressing in place with some surrounding bruising.  NEUROLOGIC: Cranial nerves II through XII are intact. No focal Motor or sensory deficits b/l.   PSYCHIATRIC: The patient is alert and oriented x 3.  SKIN: No obvious rash, lesion, or ulcer.    LABORATORY PANEL:   CBC Recent Labs  Lab 08/04/19 0450  WBC 8.9  HGB 9.2*  HCT 27.5*  PLT 212   ------------------------------------------------------------------------------------------------------------------  Chemistries  Recent Labs  Lab 08/02/19 1921  08/04/19 0450  NA 134*   < > 132*  K 3.7   < > 4.5  CL 99   < > 100  CO2 26   < > 24  GLUCOSE 110*   < > 150*  BUN 22   < > 16  CREATININE 0.84   < > 0.93  CALCIUM 9.5   < > 8.1*  AST 30  --   --   ALT 21  --   --   ALKPHOS 50  --   --   BILITOT 0.5  --   --    < > = values in this interval not displayed.   ------------------------------------------------------------------------------------------------------------------  Cardiac Enzymes No results for input(s): TROPONINI in the last 168 hours. ------------------------------------------------------------------------------------------------------------------  RADIOLOGY:  Dg Chest 1 View  Result Date: 08/02/2019 CLINICAL DATA:  Fall, hip fracture EXAM: CHEST  1 VIEW COMPARISON:  11/02/2015 FINDINGS: The heart size and mediastinal contours are within normal limits. Both lungs are clear. The visualized skeletal structures  are unremarkable. IMPRESSION: No acute abnormality of the lungs in AP portable projection. Electronically Signed   By: Eddie Candle M.D.   On: 08/02/2019 20:38   Ct Hip Right Wo Contrast  Result Date: 08/02/2019 CLINICAL DATA:  Hip trauma question of fracture EXAM: CT OF THE RIGHT HIP WITHOUT CONTRAST TECHNIQUE: Multidetector CT imaging  of the right hip was performed according to the standard protocol. Multiplanar CT image reconstructions were also generated. COMPARISON:  None. FINDINGS: Bones/Joint/Cartilage There is an impacted nondisplaced fracture seen through the femoral neck just at the head neck junction. The femoral head is still well seated within the acetabulum. No other fracture is identified. There is mild osteoarthritis with superior joint space loss and subchondral sclerosis. Ligaments Suboptimally assessed by CT. Muscles and Tendons Mild fatty atrophy of the muscles surrounding the hip is seen. The tendons appear to be grossly intact. Soft tissues Mild surrounding soft tissue swelling is seen. The visualized portion of the deep pelvis is unremarkable. IMPRESSION: Nondisplaced impacted femoral neck fracture. Electronically Signed   By: Prudencio Pair M.D.   On: 08/02/2019 20:58   Dg Hip Unilat With Pelvis 2-3 Views Right  Result Date: 08/03/2019 CLINICAL DATA:  Right hip hemiarthroplasty EXAM: DG HIP (WITH OR WITHOUT PELVIS) 2-3V RIGHT COMPARISON:  08/02/2019 FINDINGS: Changes of right hip hemiarthroplasty. No hardware bony complicating feature. Normal alignment. IMPRESSION: Right hip hemiarthroplasty.  No complicating feature. Electronically Signed   By: Rolm Baptise M.D.   On: 08/03/2019 21:45   Dg Hip Unilat W Or Wo Pelvis 2-3 Views Right  Result Date: 08/02/2019 CLINICAL DATA:  Right hip pain after fall. EXAM: DG HIP (WITH OR WITHOUT PELVIS) 2-3V RIGHT COMPARISON:  Report from left hip radiograph 01/25/2016, images not available. FINDINGS: Impacted fracture of the right femoral neck. No significant angulation or proximal migration of the femoral shaft. Femoral head is well seated in the acetabulum with mild degenerative change. Pubic symphysis and sacroiliac joints are congruent. Pubic rami are intact. Surgical hardware in the lower lumbar spine is partially included. IMPRESSION: Impacted right femoral neck fracture.  Electronically Signed   By: Keith Rake M.D.   On: 08/02/2019 19:25     ASSESSMENT AND PLAN:   73 year old female with past medical history of hypertension, hyperlipidemia, anxiety, osteoarthritis who presented to the hospital after a mechanical fall noted to have right hip fracture.  1.  Status post fall and right hip fracture- presents to the hospital after a mechanical fall. -Status post right hip hemiarthroplasty postop day 1 today.  Pain is well tolerated.  Continue work with physical therapy. -Patient will likely need short-term rehab upon discharge.  2.  Essential hypertension-continue losartan.  3.  Neuropathy-continue gabapentin.  4.  Anxiety/depression-continue Cymbalta.  5.  GERD-continue Protonix.  6.  Hyperlipidemia-continue simvastatin.  Await physical therapy evaluation and patient will likely need short-term rehab upon discharge.   All the records are reviewed and case discussed with Care Management/Social Worker. Management plans discussed with the patient, family and they are in agreement.  CODE STATUS: Full code  DVT Prophylaxis: Lovenox   TOTAL TIME TAKING CARE OF THIS PATIENT: 25 minutes.   POSSIBLE D/C IN 1-2 DAYS, DEPENDING ON CLINICAL CONDITION.   Henreitta Leber M.D on 08/04/2019 at 1:17 PM  Between 7am to 6pm - Pager - [phone]  After 6pm go to [email or web address] - Proofreader  [facility]  Office  [phone]  CC: Primary care physician; Derinda Late, MD

## 2019-08-04 NOTE — Evaluation (Signed)
Occupational Therapy Evaluation Patient Details Name: Brittney Ramsey MRN: [id number] DOB: [birth year] Today's Date: 08/04/2019    History of Present Illness Pt. is a 73 y.o female who was admitted to [facility] with right Displaced Femoral Neck Fracture s/p right posterior Hemiarthroplasty. PMHx includes: Allergic Rhinitis, Allergy, Anxiety, Athritis, Hyperlipidemia, HTN, PONV   Clinical Impression   Pt. presents with weakness, limited activity tolerance, and limited functional mobility which limits the ability to complete basic ADL and IADL functioning. Pt. Resides at home with her husband who has Dementia. Pt. was independent with ADLs, and IADL functioning: including meal preparation, and medication management. Pt. was able to drive. Pt. has supportive family. Pt. education was provided about posterior hip precautions, and A/E use for LE ADLs. Pt. reports having a reacher, and sockaide at home following back surgery a year go. Pt. continues to benefit from OT services for ADL training, A/E training, and pt. education about home modification, and DME. Pt. would benefit from SNF level of care upon discharge, with follow-up OT services.    Follow Up Recommendations  SNF    Equipment Recommendations  3 in 1 bedside commode    Recommendations for Other Services       Precautions / Restrictions Precautions Precautions: Posterior Hip Precaution Booklet Issued: Yes (comment) Precaution Comments: Reviewed booklet and reinforced with cuing Restrictions Weight Bearing Restrictions: Yes RLE Weight Bearing: Weight bearing as tolerated Other Position/Activity Restrictions: WBAT with Post Hip Precautions      Mobility Bed Mobility  Pt. up in chair       Transfers Overall transfer level: Needs assistance Equipment used: Rolling walker (2 wheeled) Transfers: Sit to/from Stand Sit to Stand: Min guard         General transfer comment: Mobility per PT report    Balance                              ADL either performed or assessed with clinical judgement   ADL Overall ADL's : Needs assistance/impaired Eating/Feeding: Independent;Sitting   Grooming: Independent;Sitting   Upper Body Bathing: Independent;Set up   Lower Body Bathing: Moderate assistance;Set up   Upper Body Dressing : Set up;Independent;Sitting   Lower Body Dressing: Moderate assistance;Set up                 General ADL Comments: Pt. education was provided about A/E use for LE ADLs     Vision Baseline Vision/History: No visual deficits       Perception     Praxis      Pertinent Vitals/Pain Pain Assessment: 0-10 Pain Score: 7  Pain Location: At rest 1/10 but constantly aware of the hip, 7/10 with functional movement and ambulation. Pain Descriptors / Indicators: Hervey Ard;Sore Pain Intervention(s): Monitored during session;Limited activity within patient's tolerance     Hand Dominance Right   Extremity/Trunk Assessment Upper Extremity Assessment Upper Extremity Assessment: Overall WFL for tasks assessed      Cervical / Trunk Assessment Cervical / Trunk Assessment: Normal   Communication Communication Communication: No difficulties   Cognition Arousal/Alertness: Awake/alert Behavior During Therapy: WFL for tasks assessed/performed Overall Cognitive Status: Within Functional Limits for tasks assessed                                     General Comments       Exercises   Shoulder Instructions  Home Living Family/patient expects to be discharged to:: Private residence Living Arrangements: Spouse/significant other(Spouse with dementia and cannot asst the pt with ADLs) Available Help at Discharge: Family Type of Home: House Home Access: Ramped entrance     Home Layout: One level     Bathroom Shower/Tub: Walk-in shower;Other (comment)(Chair in shower)   Bathroom Toilet: Handicapped height     Home Equipment: [place] - single point;Toilet  riser;Walker - 2 wheels          Prior Functioning/Environment Level of Independence: Independent with assistive device(s)        Comment: Independent with ADLs, IADLs, and is primary caregiver for her husband who has Dementia.  Pt. was independent with medication management.        OT Problem List: Decreased strength;Pain;Impaired UE functional use      OT Treatment/Interventions: Self-care/ADL training;Therapeutic exercise;Patient/family education;DME and/or AE instruction    OT Goals(Current goals can be found in the care plan section) Acute Rehab OT Goals Patient Stated Goal: Improve strength and return to independence with ADLs  OT Frequency: Min 2X/week   Barriers to D/C:            Co-evaluation              AM-PAC OT "6 Clicks" Daily Activity     Outcome Measure Help from another person eating meals?: None Help from another person taking care of personal grooming?: None Help from another person toileting, which includes using toliet, bedpan, or urinal?: A Little Help from another person bathing (including washing, rinsing, drying)?: A Lot Help from another person to put on and taking off regular upper body clothing?: None Help from another person to put on and taking off regular lower body clothing?: A Lot 6 Click Score: 19   End of Session    Activity Tolerance: Patient tolerated treatment well Patient left: in bed  OT Visit Diagnosis: Unsteadiness on feet (R26.81)                Time: [id number] OT Time Calculation (min): 18 min Charges:  OT General Charges $OT Visit: 1 Visit OT Evaluation $OT Eval Low Complexity: 1 Low   Harrel Carina, MS, OTR/L  Harrel Carina 08/04/2019, 3:29 PM

## 2019-08-04 NOTE — TOC Benefit Eligibility Note (Signed)
Transition of Care [facility]) Benefit Eligibility Note    Patient Details  Name: Brittney Ramsey MRN: [id number] Date of Birth: 08-22-1946   Medication/Dose: Enoxaparin 40mg  once daily for 14 days  Covered?: Yes  Tier: Other(Tier 4)  Prescription Coverage Preferred Pharmacy: [facility] with Person/Company/Phone Number:: Lyndee Leo with [employer] at [phone]  Co-Pay: $90.00 estimated copay  Prior Approval: No  Deductible: (No deductible on plan.)    Dannette Barbara Phone Number: [phone] or [phone] 08/04/2019, 2:44 PM

## 2019-08-04 NOTE — TOC Progression Note (Signed)
Transition of Care [facility]) - Progression Note    Patient Details  Name: Brittney Ramsey MRN: [id number] Date of Birth: Aug 11, 1946  [facility]) CM/SW Contact  Jabria Loos, [name] Phone Number: [phone]  08/04/2019, 4:12 PM  Clinical Narrative: Clinical Social Worker ([place]) received [facility] SNF authorization for [facility] for 7 days, authorization # [id number]. Patient is aware of above. [facility] liaison is aware of above.      Expected Discharge Plan: [place] Barriers to Discharge: Continued Medical Work up  Expected Discharge Plan and Services Expected Discharge Plan: [place] In-house Referral: Clinical Social Work   Post Acute Care Choice: [place] Living arrangements for the past 2 months: Single Family Home Expected Discharge Date: 08/07/19                                     Social Determinants of Health (SDOH) Interventions    Readmission Risk Interventions No flowsheet data found.

## 2019-08-04 NOTE — Evaluation (Signed)
Physical Therapy Evaluation Patient Details Name: Brittney Ramsey MRN: [id number] DOB: 11-11-1946 Today's Date: 08/04/2019   History of Present Illness  Per MD: PT is a 73 y.o. female presents from home with mechanical fall and subsequent right hip pain. On imaging here she is found to have a hip fracture. Pt s/p R hemiarthroplasty. PMH includes: HTN, anxiety.  Clinical Impression  Pt presented with deficits in strength, transfers, mobility, gait, balance, and activity tolerance. Pt was in good spirits despite reporting pain during bed mobility of 7/10, and pt's pain did not go beyond this level during the session. Pt was receptive to hip precaution training but required consistent cuing during mobility and ambulation to keep from breaking the precautions. Pt was educated that hip was WBAT and pace of ambulation was slow and stride length was short at first but increased steadily while walking supported by the RW. Pt will benefit from PT services in a SNF setting upon discharge to safely address above deficits for decreased caregiver assistance and eventual return to PLOF.       Follow Up Recommendations SNF    Equipment Recommendations  Rolling walker with 5" wheels;3in1 (PT)    Recommendations for Other Services       Precautions / Restrictions Precautions Precautions: Posterior Hip Precaution Booklet Issued: Yes (comment) Precaution Comments: Reviewed booklet and reinforced with cuing Restrictions Weight Bearing Restrictions: Yes RLE Weight Bearing: Weight bearing as tolerated Other Position/Activity Restrictions: WBAT with Post Hip Precautions      Mobility  Bed Mobility Overal bed mobility: Needs Assistance Bed Mobility: Sidelying to Sit   Sidelying to sit: Min assist       General bed mobility comments: Needed help clearing feet past EOB  Transfers Overall transfer level: Needs assistance Equipment used: Rolling walker (2 wheeled) Transfers: Sit to/from Stand Sit  to Stand: Min guard         General transfer comment: Cuing to maintain hip precautions  Ambulation/Gait Ambulation/Gait assistance: Min assist Gait Distance (Feet): 25 Feet Assistive device: Rolling walker (2 wheeled) Gait Pattern/deviations: Step-through pattern;Decreased step length - right;Decreased step length - left;Antalgic Gait velocity: Decreased   General Gait Details: Pt's steps were very small and tentative but with cuing increased her stride length, min A for balance support.  Stairs            Wheelchair Mobility    Modified Rankin (Stroke Patients Only)       Balance Overall balance assessment: Needs assistance Sitting-balance support: Bilateral upper extremity supported;Feet unsupported Sitting balance-Leahy Scale: Fair Sitting balance - Comments: Pt educated on how to maintain hip precautions at EOB and to sit on elevated surfaces.   Standing balance support: Bilateral upper extremity supported;During functional activity Standing balance-Leahy Scale: Fair                               Pertinent Vitals/Pain Pain Assessment: 0-10 Pain Score: 7  Pain Location: At rest 1/10 but constantly aware of the hip, 7/10 with functional movement and ambulation. Pain Descriptors / Indicators: Hervey Ard;Sore Pain Intervention(s): Monitored during session;Limited activity within patient's tolerance    Home Living Family/patient expects to be discharged to:: Private residence Living Arrangements: Spouse/significant other(Spouse with dementia and cannot asst the pt with ADLs) Available Help at Discharge: Family Type of Home: House Home Access: Ramped entrance     Home Layout: One level Home Equipment: Cane - single point;Toilet riser;Walker - 2 wheels  Prior Function Level of Independence: Independent with assistive device(s)         Comments: Pt was Ind with home distances, and used a quad cane for community ambulation. Ind with ADLs,  IADLs, and is primary caregiver for her husband with dementia who cannot provide her assistance.     Hand Dominance   Dominant Hand: Right    Extremity/Trunk Assessment   Upper Extremity Assessment Upper Extremity Assessment: Overall WFL for tasks assessed    Lower Extremity Assessment Lower Extremity Assessment: Generalized weakness    Cervical / Trunk Assessment Cervical / Trunk Assessment: Normal  Communication   Communication: No difficulties  Cognition Arousal/Alertness: Awake/alert Behavior During Therapy: WFL for tasks assessed/performed Overall Cognitive Status: Within Functional Limits for tasks assessed                                        General Comments      Exercises Total Joint Exercises Ankle Circles/Pumps: AROM;Strengthening;Both;10 reps;15 reps Quad Sets: AROM;Strengthening;Both;10 reps;15 reps Hip ABduction/ADduction: AROM;Strengthening;Both;10 reps;15 reps Straight Leg Raises: AROM;Strengthening;Both;5 reps Long Arc Quad: AROM;Strengthening;Both;10 reps;15 reps Knee Flexion: AROM;Strengthening;Both;5 reps;10 reps Marching in Standing: AROM;Strengthening;Both;5 reps;10 reps Other Exercises Other Exercises: Pt ambulated in room practicing safe turns, sit <> stand, and RW management.   Assessment/Plan    PT Assessment Patient needs continued PT services  PT Problem List Decreased strength;Decreased balance;Decreased activity tolerance;Decreased mobility       PT Treatment Interventions DME instruction;Gait training;Functional mobility training;Therapeutic activities;Therapeutic exercise;Balance training;Patient/family education    PT Goals (Current goals can be found in the Care Plan section)  Acute Rehab PT Goals Patient Stated Goal: Improve strength and return to independence with ADLs PT Goal Formulation: With patient Time For Goal Achievement: 08/17/19 Potential to Achieve Goals: Fair    Frequency BID   Barriers to  discharge        Co-evaluation               AM-PAC PT "6 Clicks" Mobility  Outcome Measure Help needed turning from your back to your side while in a flat bed without using bedrails?: A Little Help needed moving from lying on your back to sitting on the side of a flat bed without using bedrails?: A Little Help needed moving to and from a bed to a chair (including a wheelchair)?: A Little Help needed standing up from a chair using your arms (e.g., wheelchair or bedside chair)?: A Lot Help needed to walk in hospital room?: A Lot Help needed climbing 3-5 steps with a railing? : A Lot 6 Click Score: 15    End of Session Equipment Utilized During Treatment: Gait belt Activity Tolerance: Patient limited by pain Patient left: in chair;with call bell/phone within reach;with chair alarm set;with SCD's reapplied Nurse Communication: Mobility status PT Visit Diagnosis: Unsteadiness on feet (R26.81);Other abnormalities of gait and mobility (R26.89);Muscle weakness (generalized) (M62.81);Pain Pain - Right/Left: Right Pain - part of body: Hip(Expected for post-op hip)    Time: [id number] PT Time Calculation (min) (ACUTE ONLY): 38 min   Charges:             Juanda Crumble "Gus" Jeannette Corpus, SPT  08/04/19, 2:17 PM

## 2019-08-04 NOTE — Progress Notes (Signed)
Physical Therapy Treatment Patient Details Name: Brittney Ramsey MRN: [id number] DOB: 31-Mar-1946 Today's Date: 08/04/2019    History of Present Illness Pt. is a 73 y.o female who was admitted to [facility] with right Displaced Femoral Neck Fracture s/p right posterior Hemiarthroplasty. PMHx includes: Allergic Rhinitis, Allergy, Anxiety, Athritis, Hyperlipidemia, HTN, PONV    PT Comments    Pt presented with deficits in strength, transfers, mobility, gait, balance, and activity tolerance.  Pt required mod A during sit to sup for BLEs into bed.  Pt was CGA with transfers but continued to require verbal cues for proper sequencing to ensure compliance with post hip precautions.  Pt was able to amb 2 x 25' with a RW and slow, step-to gait pattern that progressed to beginning step-through pattern.  Pt very cautious with amb and required encouragement for increased step length/cadence.  Pt will benefit from PT services in a SNF setting upon discharge to safely address above deficits for decreased caregiver assistance and eventual return to PLOF.    Follow Up Recommendations  SNF     Equipment Recommendations  Rolling walker with 5" wheels;3in1 (PT)    Recommendations for Other Services       Precautions / Restrictions Precautions Precautions: Posterior Hip Precaution Booklet Issued: Yes (comment) Precaution Comments: Reviewed booklet and reinforced with cuing Restrictions Weight Bearing Restrictions: Yes RLE Weight Bearing: Weight bearing as tolerated Other Position/Activity Restrictions: WBAT with Post Hip Precautions    Mobility  Bed Mobility Overal bed mobility: Needs Assistance Bed Mobility: Sit to Supine   Sidelying to sit: Min assist   Sit to supine: Mod assist   General bed mobility comments: Mod A for BLEs into bed with cues for sequencing for post hip precaution compliance  Transfers Overall transfer level: Needs assistance Equipment used: Rolling walker (2  wheeled) Transfers: Sit to/from Stand Sit to Stand: Min guard         General transfer comment: Mod verbal cuing to maintain hip precautions  Ambulation/Gait Ambulation/Gait assistance: Min guard Gait Distance (Feet): 25 Feet x 2 Assistive device: Rolling walker (2 wheeled) Gait Pattern/deviations: Step-through pattern;Decreased step length - right;Decreased step length - left;Antalgic;Step-to pattern Gait velocity: Decreased   General Gait Details: Step-to pattern slowly progressing to step-through pattern with very short step length and slow cadence but no LOB   Stairs             Wheelchair Mobility    Modified Rankin (Stroke Patients Only)       Balance Overall balance assessment: Needs assistance Sitting-balance support: Bilateral upper extremity supported;Feet unsupported Sitting balance-Leahy Scale: Good Sitting balance - Comments: Pt educated on how to maintain hip precautions at EOB and to sit on elevated surfaces.   Standing balance support: Bilateral upper extremity supported Standing balance-Leahy Scale: Fair                              Cognition Arousal/Alertness: Awake/alert Behavior During Therapy: WFL for tasks assessed/performed Overall Cognitive Status: Within Functional Limits for tasks assessed                                        Exercises Total Joint Exercises Ankle Circles/Pumps: AROM;Strengthening;Both;10 reps;15 reps Quad Sets: AROM;Strengthening;Both;10 reps;15 reps Hip ABduction/ADduction: AROM;Strengthening;Both;10 reps;15 reps Straight Leg Raises: AROM;Strengthening;Both;5 reps Long Arc Quad: AROM;Strengthening;Both;10 reps;15 reps Knee Flexion: AROM;Strengthening;Both;5 reps;10  reps Marching in Standing: AROM;Both;5 reps;10 reps;Standing Other Exercises Other Exercises: Post hip precaution education and review with pt recalling 1/3 hip precautions Other Exercises: 90 deg R turn training x 4 to  prevenet CKC R hip IR    General Comments        Pertinent Vitals/Pain Pain Assessment: 0-10 Pain Score: 7  Pain Location: R hip Pain Descriptors / Indicators: Aching;Sore Pain Intervention(s): Premedicated before session;Monitored during session    [place] expects to be discharged to:: Private residence Living Arrangements: Spouse/significant other(Spouse with dementia and cannot asst the pt with ADLs) Available Help at Discharge: Family Type of Home: House Home Access: Ramped entrance   Home Layout: One level [place]: [place] - single point;Toilet riser;Walker - 2 wheels      Prior Function Level of Independence: Independent with assistive device(s)      Comments: Independent with ADLs, IADLs, and is primary caregiver for her husband who has Dementia.  Pt. was independent with medication management.   PT Goals (current goals can now be found in the care plan section) Acute Rehab PT Goals Patient Stated Goal: Improve strength and return to independence with ADLs PT Goal Formulation: With patient Time For Goal Achievement: 08/17/19 Potential to Achieve Goals: Fair Progress towards PT goals: Progressing toward goals    Frequency    BID      PT Plan Current plan remains appropriate    Co-evaluation              AM-PAC PT "6 Clicks" Mobility   Outcome Measure  Help needed turning from your back to your side while in a flat bed without using bedrails?: A Little Help needed moving from lying on your back to sitting on the side of a flat bed without using bedrails?: A Little Help needed moving to and from a bed to a chair (including a wheelchair)?: A Little Help needed standing up from a chair using your arms (e.g., wheelchair or bedside chair)?: A Little Help needed to walk in hospital room?: A Little Help needed climbing 3-5 steps with a railing? : A Lot 6 Click Score: 17    End of Session Equipment Utilized During Treatment: Gait  belt Activity Tolerance: Patient tolerated treatment well Patient left: in bed;with call bell/phone within reach;with bed alarm set Nurse Communication: Mobility status PT Visit Diagnosis: Unsteadiness on feet (R26.81);Other abnormalities of gait and mobility (R26.89);Muscle weakness (generalized) (M62.81);Pain Pain - Right/Left: Right Pain - part of body: Hip     Time: [id number] PT Time Calculation (min) (ACUTE ONLY): 25 min  Charges:  $Therapeutic Exercise: 8-22 mins $Therapeutic Activity: 8-22 mins                     D. Scott Deshawn Witty PT, DPT 08/04/19, 4:11 PM

## 2019-08-05 LAB — SURGICAL PATHOLOGY

## 2019-08-05 MED ORDER — OXYCODONE HCL 5 MG PO TABS: 10.0000 mg | ORAL_TABLET | ORAL | 0 refills | Status: DC | PRN

## 2019-08-05 MED ORDER — ENOXAPARIN SODIUM 40 MG/0.4ML ~~LOC~~ SOLN: 40.0000 mg | SUBCUTANEOUS | 0 refills | Status: DC

## 2019-08-05 NOTE — Progress Notes (Signed)
Physical Therapy Treatment Patient Details Name: Brittney Ramsey MRN: [id number] DOB: 06-22-1946 Today's Date: 08/05/2019    History of Present Illness Pt. is a 73 y.o female who was admitted to [facility] with right Displaced Femoral Neck Fracture s/p right posterior Hemiarthroplasty. PMHx includes: Allergic Rhinitis, Allergy, Anxiety, Athritis, Hyperlipidemia, HTN, PONV    PT Comments    Pt presented with deficits in strength, transfers, mobility, gait, balance, and activity tolerance but made good progress towards goals this session.  Pt was min A with bed mobility tasks and CGA with transfers with cues for sequencing for hip precaution compliance.  Pt was able to increase her amb distance to 150' this session with CGA and a RW with cues for upright posture and increased cadence/step-length.  Extensive education related to hip precautions with functional tasks including car transfers provided to pt and daughter.  Pt will benefit from HHPT services upon discharge to safely address above deficits for decreased caregiver assistance and eventual return to PLOF.     Follow Up Recommendations  Home health PT;Supervision for mobility/OOB     Equipment Recommendations  None recommended by PT;Other (comment)(daughter states has a BSC and a FWW)    Recommendations for Other Services       Precautions / Restrictions Precautions Precautions: Posterior Hip Precaution Booklet Issued: Yes (comment) Precaution Comments: Reviewed hip precautions and precaution handout with pt and daughter Restrictions Weight Bearing Restrictions: Yes RLE Weight Bearing: Weight bearing as tolerated    Mobility  Bed Mobility Overal bed mobility: Needs Assistance Bed Mobility: Supine to Sit     Supine to sit: Min assist     General bed mobility comments: Min A for RLE out of bed  Transfers Overall transfer level: Needs assistance Equipment used: Rolling walker (2 wheeled) Transfers: Sit to/from Stand Sit to  Stand: Min guard;From elevated surface         General transfer comment: Mod verbal cuing to maintain hip precautions with sequencing education/practice with both pt and daughter  Ambulation/Gait Ambulation/Gait assistance: Min guard Gait Distance (Feet): 150 Feet Assistive device: Rolling walker (2 wheeled) Gait Pattern/deviations: Decreased step length - right;Decreased step length - left;Antalgic;Step-through pattern Gait velocity: Decreased   General Gait Details: Slow cadence and short B step length that improved with cues; pt steady with no LOB throughout   Stairs             Wheelchair Mobility    Modified Rankin (Stroke Patients Only)       Balance Overall balance assessment: Needs assistance Sitting-balance support: Bilateral upper extremity supported;Feet unsupported Sitting balance-Leahy Scale: Good     Standing balance support: Bilateral upper extremity supported Standing balance-Leahy Scale: Fair                              Cognition Arousal/Alertness: Awake/alert Behavior During Therapy: WFL for tasks assessed/performed Overall Cognitive Status: Within Functional Limits for tasks assessed                                        Exercises Total Joint Exercises Ankle Circles/Pumps: AROM;Strengthening;Both;10 reps;15 reps Quad Sets: AROM;Strengthening;Both;10 reps;15 reps Hip ABduction/ADduction: AROM;Strengthening;Both;10 reps;15 reps Straight Leg Raises: AROM;Strengthening;Both;5 reps;AAROM Long Arc Quad: AROM;Strengthening;Both;10 reps;15 reps Knee Flexion: AROM;Strengthening;Both;5 reps;10 reps Marching in Standing: AROM;Both;10 reps;Standing Other Exercises Other Exercises: Post hip precaution education and review with pt  and daughter Other Exercises: 90 deg R turn training x 4 to prevenet CKC R hip IR Other Exercises: Transfer and bed mobility sequencing education and practice with daughter Other Exercises: Musician  transfer education with pt/daughter with verbal and visual education using a chair to simulate a car Other Exercises: HEP education/review per handout with daughter    General Comments        Pertinent Vitals/Pain Pain Assessment: 0-10 Pain Score: 7  Pain Location: R hip Pain Descriptors / Indicators: Aching;Sore Pain Intervention(s): Premedicated before session;Monitored during session    Home Living                      Prior Function            PT Goals (current goals can now be found in the care plan section) Progress towards PT goals: Progressing toward goals    Frequency    BID      PT Plan Discharge plan needs to be updated    Co-evaluation              AM-PAC PT "6 Clicks" Mobility   Outcome Measure  Help needed turning from your back to your side while in a flat bed without using bedrails?: A Little Help needed moving from lying on your back to sitting on the side of a flat bed without using bedrails?: A Little Help needed moving to and from a bed to a chair (including a wheelchair)?: A Little Help needed standing up from a chair using your arms (e.g., wheelchair or bedside chair)?: A Little Help needed to walk in hospital room?: A Little Help needed climbing 3-5 steps with a railing? : A Little 6 Click Score: 18    End of Session Equipment Utilized During Treatment: Gait belt Activity Tolerance: Patient tolerated treatment well Patient left: in chair;with call bell/phone within reach;with chair alarm set;with family/visitor present;with SCD's reapplied;Other (comment)(abd pillows in place) Nurse Communication: Mobility status PT Visit Diagnosis: Unsteadiness on feet (R26.81);Other abnormalities of gait and mobility (R26.89);Muscle weakness (generalized) (M62.81);Pain Pain - Right/Left: Right Pain - part of body: Hip     Time: [id number] PT Time Calculation (min) (ACUTE ONLY): 39 min  Charges:  $Gait Training: 8-22 mins $Therapeutic  Exercise: 8-22 mins $Therapeutic Activity: 8-22 mins                     D. Scott Orphia Mctigue PT, DPT 08/05/19, 12:06 PM

## 2019-08-05 NOTE — Progress Notes (Signed)
Discharge instructions reviewed with patient and daughter. They verbalized understanding. Did teaching with them on how to administer lovenox shots. IV removed. Stable condition. Patient wheeled out with NT and daughter.

## 2019-08-05 NOTE — TOC Progression Note (Signed)
Transition of Care [facility]) - Progression Note    Patient Details  Name: Brittney Ramsey MRN: [id number] Date of Birth: Apr 09, 1946  [facility] CM/SW Contact  [name], [name] Phone Number: [phone]  08/05/2019, 9:08 AM  Clinical Narrative: Clinical Social Worker (CSW) contacted patient's daughter [name] to discuss D/C plan. Daughter reported that she prefers for patient to come home with home health. Per daughter patient will have 24/7 supervision. Daughter reported that she is staying at patient's house now taking care of patient's husband. Daughter confirmed that patient has a rolling walker and bedside commode at home. CSW explained that PT recommended SNF yesterday however that recommendation may change today if patient improves with PT. CSW explained that [facility] has been arranged for home health and [facility] has been arranged for SNF. CSW will contact daughter to discuss final D/C decision after PT works with patient today. Per [name] [facility] liaison patient does require another covid test if patient comes to [facility]. CSW will continue to follow and assist as needed.      Expected Discharge Plan: [place] Barriers to Discharge: Continued Medical Work up  Expected Discharge Plan and Services Expected Discharge Plan: [place] In-house Referral: Clinical Social Work   Post Acute Care Choice: [place] Living arrangements for the past 2 months: Single Family Home Expected Discharge Date: 08/07/19                                     Social Determinants of Health (SDOH) Interventions    Readmission Risk Interventions No flowsheet data found.

## 2019-08-05 NOTE — Anesthesia Postprocedure Evaluation (Signed)
Anesthesia Post Note  Patient: Brittney Ramsey  Procedure(s) Performed: ARTHROPLASTY BIPOLAR HIP (HEMIARTHROPLASTY) (Right Hip)  Patient location during evaluation: PACU Anesthesia Type: General Level of consciousness: awake and alert Pain management: pain level controlled Vital Signs Assessment: post-procedure vital signs reviewed and stable Respiratory status: spontaneous breathing, nonlabored ventilation and respiratory function stable Cardiovascular status: blood pressure returned to baseline and stable Postop Assessment: no apparent nausea or vomiting Anesthetic complications: no                   Durenda Hurt

## 2019-08-05 NOTE — Progress Notes (Signed)
   Subjective: 2 Days Post-Op Procedure(s) (LRB): ARTHROPLASTY BIPOLAR HIP (HEMIARTHROPLASTY) (Right) Patient reports pain as 5 on 0-10 scale.   Patient is well, and has had no acute complaints or problems Patient did well with therapy yesterday.  Was able to ambulate 25 feet Plan is to go Rehab after hospital stay. no nausea and no vomiting Patient denies any chest pains or shortness of breath. Objective: Vital signs in last 24 hours: Temp:  [98.3 F (36.8 C)-98.5 F (36.9 C)] 98.5 F (36.9 C) (09/22 2337) Pulse Rate:  [84-93] 84 (09/22 2337) Resp:  [17-19] 19 (09/22 2337) BP: (128-130)/(56-59) 130/59 (09/22 2337) SpO2:  [90 %-100 %] 93 % (09/22 2337) well approximated incision Heels are non tender and elevated off the bed using rolled towels Intake/Output from previous day: 09/22 0701 - 09/23 0700 In: -  Out: 570 [Urine:400; Drains:170] Intake/Output this shift: No intake/output data recorded.  Recent Labs    08/02/19 1921 08/03/19 0525 08/04/19 0450  HGB 12.3 11.3* 9.2*   Recent Labs    08/03/19 0525 08/04/19 0450  WBC 8.2 8.9  RBC 3.62* 2.94*  HCT 33.9* 27.5*  PLT 260 212   Recent Labs    08/03/19 0525 08/04/19 0450  NA 132* 132*  K 3.5 4.5  CL 97* 100  CO2 26 24  BUN 18 16  CREATININE 0.69 0.93  GLUCOSE 116* 150*  CALCIUM 8.8* 8.1*   No results for input(s): LABPT, INR in the last 72 hours.  EXAM General - Patient is Alert, Appropriate and Oriented Extremity - Neurologically intact Neurovascular intact Sensation intact distally Intact pulses distally Dorsiflexion/Plantar flexion intact No cellulitis present Compartment soft Dressing - moderate drainage Motor Function - intact, moving foot and toes well on exam.    Past Medical History:  Diagnosis Date  . Allergic rhinitis   . Allergy   . Anxiety   . Arthritis   . Complication of anesthesia   . Hyperlipemia   . Hypertension   . PONV (postoperative nausea and vomiting)      Assessment/Plan: 2 Days Post-Op Procedure(s) (LRB): ARTHROPLASTY BIPOLAR HIP (HEMIARTHROPLASTY) (Right) Principal Problem:   Hip fracture, right (HCC) Active Problems:   HTN (hypertension)   HLD (hyperlipidemia)   Hip fracture (HCC)  Estimated body mass index is 24.62 kg/m as calculated from the following:   Height as of this encounter: 5\' 3"  (1.6 m).   Weight as of this encounter: 63 kg. Up with therapy Discharge to SNF when medically cleared  Labs: None DVT Prophylaxis - Lovenox, TED hose and SCDs Weight-Bearing as tolerated to right leg Hemovac removed today as well dressing changed Patient will need to follow-up in clinical clinic in 2 weeks Continue Lovenox 2 weeks postop    Jon R. [place] 08/05/2019, 7:04 AM

## 2019-08-05 NOTE — Care Management Important Message (Signed)
Important Message  Patient Details  Name: Brittney Ramsey MRN: [id number] Date of Birth: 05/09/1946   Medicare Important Message Given:  Yes     [name] 08/05/2019, 11:01 AM

## 2019-08-05 NOTE — TOC Transition Note (Signed)
Transition of Care [facility]) - CM/SW Discharge Note   Patient Details  Name: Brittney Ramsey MRN: [id number] Date of Birth: 04/19/1946  Transition of Care [facility] CM/SW Contact:  Parissa Chiao, [name] Phone Number: [phone]  08/05/2019, 2:01 PM   Clinical Narrative: Patient improved with PT today. PT changed recommendation to home health. Clinical Social Worker (CSW) met with patient and made her aware of above. Patient is agreeable to D/C home today with [facility]. CSW provided patient with CMS home health list. Helene Kelp [facility] representative is aware of D/C today. CSW made patient aware that her Lovenox price is $90. RN will teach patient and patient's daughter [name] how to do Lovenox injections today. Patient confirmed that she has a rolling walker and bedside commode at home. CSW contacted patient's daughter [name] and made her aware of above. Daughter is in agreement with D/C home today and will pick up patient. RN aware of above. Health Team case manager aware of above. CSW also made [place] liaison aware of above. Please reconsult if future social work needs arise. CSW signing off.       Final next level of care: [place] Barriers to Discharge: Barriers Resolved   Patient Goals and CMS Choice Patient states their goals for this hospitalization and ongoing recovery are:: To go home. CMS Medicare.gov Compare Post Acute Care list provided to:: Patient Choice offered to / list presented to : Patient  Discharge Placement                       Discharge Plan and Services In-house Referral: Clinical Social Work   Post Acute Care Choice: [place]          DME Arranged: N/A         HH Arranged: RN, PT, OT, Nurse's Aide [place] Agency: [facility] [employer] [facility] [employer] Date [place]: 08/05/19 Time [place]: 67 Representative spoke with at [place]: [place] ([place]) Interventions     Readmission Risk Interventions No flowsheet data found.

## 2019-08-05 NOTE — Discharge Summary (Signed)
[place] at [place] NAME: Brittney Ramsey    MR#:  [id number]  DATE OF BIRTH:  1946/08/16  DATE OF ADMISSION:  08/02/2019 ADMITTING PHYSICIAN: Lance Coon, MD  DATE OF DISCHARGE: 08/05/2019  PRIMARY CARE PHYSICIAN: Derinda Late, MD    ADMISSION DIAGNOSIS:  Closed fracture of neck of right femur, initial encounter ([place]) [S72.001A] Fall, initial encounter [W19.XXXA]  DISCHARGE DIAGNOSIS:  Principal Problem:   Hip fracture, right ([place]) Active Problems:   HTN (hypertension)   HLD (hyperlipidemia)   Hip fracture ([place])   SECONDARY DIAGNOSIS:   Past Medical History:  Diagnosis Date  . Allergic rhinitis   . Allergy   . Anxiety   . Arthritis   . Complication of anesthesia   . Hyperlipemia   . Hypertension   . PONV (postoperative nausea and vomiting)     HOSPITAL COURSE:   73 year old female with past medical history of hypertension, hyperlipidemia, anxiety, osteoarthritis who presented to the hospital after a mechanical fall noted to have right hip fracture.  1.  Status post fall and right hip fracture- presents to the hospital after a mechanical fall. -Status post right hip hemiarthroplasty postop day # 2 today.  Pain is well tolerated with oral Oxycodone.  -Seen by physical therapy and they initially recommended short-term rehab but upon reevaluation patient's mobility has significantly improved.  She would prefer to go home with home health services. -Patient is now being discharged home with home health PT, OT aide, RN. -She will continue DVT prophylaxis with Lovenox for 2 weeks postoperatively.  2.  Essential hypertension- pt. Will continue losartan.  3.  Neuropathy- pt will continue gabapentin.  4.  Anxiety/depression- pt. Will continue Cymbalta.  5.  GERD- pt. Will continue Protonix.  6.  Hyperlipidemia- pt. Will continue simvastatin.  Patient is stable to be discharged home with home health services and she is  in agreement with this plan.  DISCHARGE CONDITIONS:   Stable.   CONSULTS OBTAINED:  Treatment Team:  Dereck Leep, MD  DRUG ALLERGIES:   Allergies  Allergen Reactions  . Flagyl [Metronidazole] Nausea And Vomiting  . Biaxin [Clarithromycin] Other (See Comments)    Reaction: stomach cramps  . Erythromycin Other (See Comments)    Reaction: stomach cramps    DISCHARGE MEDICATIONS:   Allergies as of 08/05/2019      Reactions   Flagyl [metronidazole] Nausea And Vomiting   Biaxin [clarithromycin] Other (See Comments)   Reaction: stomach cramps   Erythromycin Other (See Comments)   Reaction: stomach cramps      Medication List    STOP taking these medications   cyclobenzaprine 5 MG tablet Commonly known as: FLEXERIL     TAKE these medications   acetaminophen 500 MG tablet Commonly known as: TYLENOL Take 1,000 mg by mouth daily as needed for moderate pain or headache.   ASPERCREME MAX ROLL-ON EX Apply 1 application topically daily as needed (back pain).   aspirin EC 81 MG tablet Take 81 mg by mouth daily.   bismuth subsalicylate [id number] suspension Commonly known as: PEPTO BISMOL Take 30 mLs by mouth daily as needed for diarrhea or loose stools.   CALCIUM + D PO Take 1 tablet by mouth daily.   docusate sodium 100 MG capsule Commonly known as: COLACE Take 1 capsule (100 mg total) by mouth 2 (two) times daily.   DULoxetine 60 MG capsule Commonly known as: CYMBALTA Take 60 mg by mouth 2 (two) times daily.  enoxaparin 40 MG/0.4ML injection Commonly known as: LOVENOX Inject 0.4 mLs (40 mg total) into the skin daily for 14 days. Start taking on: August 06, 2019   fluticasone 50 MCG/ACT nasal spray Commonly known as: FLONASE Place 2 sprays into both nostrils daily as needed for allergies.   gabapentin 100 MG capsule Commonly known as: NEURONTIN Take 100 mg by mouth 3 (three) times daily as needed.   Lidocaine 4 % Ptch Apply 1 patch topically  daily as needed (pain).   LORazepam 0.5 MG tablet Commonly known as: ATIVAN Take 0.5 mg by mouth at bedtime. Notes to patient: Last dose given yesterday at 9:00 PM   losartan 50 MG tablet Commonly known as: COZAAR Take 50 mg by mouth daily. Notes to patient: Last dose given today at 9:43 AM   LUBRICATING EYE DROPS OP Place 1 drop into both eyes daily as needed (dry eyes). Notes to patient: Not given in hospital   meloxicam 7.5 MG tablet Commonly known as: MOBIC Take 7.5 mg by mouth 2 (two) times daily. Notes to patient: Not given in hospital   montelukast 10 MG tablet Commonly known as: SINGULAIR Take 10 mg by mouth daily. Notes to patient: Not given in hospital   Osteo Bi-Flex Adv Triple St Tabs Take 1 tablet by mouth daily. Notes to patient: Not given in hospital   oxyCODONE 5 MG immediate release tablet Commonly known as: Oxy IR/ROXICODONE Take 2 tablets (10 mg total) by mouth every 4 (four) hours as needed for severe pain (pain score 7-10). What changed:   how much to take  reasons to take this Notes to patient: Last dose given today at 10:01 AM   pantoprazole 40 MG tablet Commonly known as: PROTONIX Take 1 tablet (40 mg total) by mouth daily. Notes to patient: Last dose given today at 9:43 AM   polyethylene glycol 17 g packet Commonly known as: MIRALAX / GLYCOLAX Take 17 g by mouth daily as needed. Notes to patient: Not given in hospital   Probiotic Caps Take 1 capsule by mouth daily. Notes to patient: Not given in hospital   simvastatin 20 MG tablet Commonly known as: ZOCOR Take 20 mg by mouth daily at 6 PM. Notes to patient: Las dose given yesterday at 9:00 PM   traMADol 50 MG tablet Commonly known as: ULTRAM Take 50 mg by mouth See admin instructions. Take 50 mg daily, may take a second 50 mg dose as needed for pain Notes to patient: Not given in hospital         DISCHARGE INSTRUCTIONS:   DIET:  Cardiac diet  DISCHARGE CONDITION:   Stable  ACTIVITY:  Activity as tolerated  OXYGEN:  Home Oxygen: No.   Oxygen Delivery: room air  DISCHARGE LOCATION:  Home with Home Health PT, OT, RN, Aide.    If you experience worsening of your admission symptoms, develop shortness of breath, life threatening emergency, suicidal or homicidal thoughts you must seek medical attention immediately by calling 911 or calling your MD immediately  if symptoms less severe.  You Must read complete instructions/literature along with all the possible adverse reactions/side effects for all the Medicines you take and that have been prescribed to you. Take any new Medicines after you have completely understood and accpet all the possible adverse reactions/side effects.   Please note  You were cared for by a hospitalist during your hospital stay. If you have any questions about your discharge medications or the care you received while you  were in the hospital after you are discharged, you can call the unit and asked to speak with the hospitalist on call if the hospitalist that took care of you is not available. Once you are discharged, your primary care physician will handle any further medical issues. Please note that NO REFILLS for any discharge medications will be authorized once you are discharged, as it is imperative that you return to your primary care physician (or establish a relationship with a primary care physician if you do not have one) for your aftercare needs so that they can reassess your need for medications and monitor your lab values.     Today   Pain is well controlled on oral oxycodone.  Patient overall feels much better.  Denies any nausea or vomiting.  Worked well with physical therapy and they recommend home health services.  VITAL SIGNS:  Blood pressure (!) 141/61, pulse 92, temperature 98.4 F (36.9 C), resp. rate 18, height 5\' 3"  (1.6 m), weight 63 kg, SpO2 94 %.  I/O:    Intake/Output Summary (Last 24 hours) at  08/05/2019 1444 Last data filed at 08/05/2019 1300 Gross per 24 hour  Intake 480 ml  Output 570 ml  Net -90 ml    PHYSICAL EXAMINATION:   GENERAL:  73 y.o.-year-old patient lying in bed in no acute distress.  EYES: Pupils equal, round, reactive to light and accommodation. No scleral icterus. Extraocular muscles intact.  HEENT: Head atraumatic, normocephalic. Oropharynx and nasopharynx clear.  NECK:  Supple, no jugular venous distention. No thyroid enlargement, no tenderness.  LUNGS: Normal breath sounds bilaterally, no wheezing, rales, rhonchi. No use of accessory muscles of respiration.  CARDIOVASCULAR: S1, S2 normal. No murmurs, rubs, or gallops.  ABDOMEN: Soft, nontender, nondistended. Bowel sounds present. No organomegaly or mass.  EXTREMITIES: No cyanosis, clubbing or edema b/l.  Right Hip: Dressing in place and Hemovac has been removed.  Some bruising.   NEUROLOGIC: Cranial nerves II through XII are intact. No focal Motor or sensory deficits b/l.   PSYCHIATRIC: The patient is alert and oriented x 3.  SKIN: No obvious rash, lesion, or ulcer.   DATA REVIEW:   CBC Recent Labs  Lab 08/04/19 0450  WBC 8.9  HGB 9.2*  HCT 27.5*  PLT 212    Chemistries  Recent Labs  Lab 08/02/19 1921  08/04/19 0450  NA 134*   < > 132*  K 3.7   < > 4.5  CL 99   < > 100  CO2 26   < > 24  GLUCOSE 110*   < > 150*  BUN 22   < > 16  CREATININE 0.84   < > 0.93  CALCIUM 9.5   < > 8.1*  AST 30  --   --   ALT 21  --   --   ALKPHOS 50  --   --   BILITOT 0.5  --   --    < > = values in this interval not displayed.    Cardiac Enzymes No results for input(s): TROPONINI in the last 168 hours.  Microbiology Results  Results for orders placed or performed during the hospital encounter of 08/02/19  SARS Coronavirus 2 [facility] order, Performed in [facility] hospital lab) Nasopharyngeal Urine, Clean Catch     Status: None   Collection Time: 08/02/19  8:02 PM   Specimen: Urine, Clean Catch;  Nasopharyngeal  Result Value Ref Range Status   SARS Coronavirus 2 NEGATIVE NEGATIVE Final  Comment: (NOTE) If result is NEGATIVE SARS-CoV-2 target nucleic acids are NOT DETECTED. The SARS-CoV-2 RNA is generally detectable in upper and lower  respiratory specimens during the acute phase of infection. The lowest  concentration of SARS-CoV-2 viral copies this assay can detect is 250  copies / mL. A negative result does not preclude SARS-CoV-2 infection  and should not be used as the sole basis for treatment or other  patient management decisions.  A negative result may occur with  improper specimen collection / handling, submission of specimen other  than nasopharyngeal swab, presence of viral mutation(s) within the  areas targeted by this assay, and inadequate number of viral copies  (<250 copies / mL). A negative result must be combined with clinical  observations, patient history, and epidemiological information. If result is POSITIVE SARS-CoV-2 target nucleic acids are DETECTED. The SARS-CoV-2 RNA is generally detectable in upper and lower  respiratory specimens dur ing the acute phase of infection.  Positive  results are indicative of active infection with SARS-CoV-2.  Clinical  correlation with patient history and other diagnostic information is  necessary to determine patient infection status.  Positive results do  not rule out bacterial infection or co-infection with other viruses. If result is PRESUMPTIVE POSTIVE SARS-CoV-2 nucleic acids MAY BE PRESENT.   A presumptive positive result was obtained on the submitted specimen  and confirmed on repeat testing.  While 2019 novel coronavirus  (SARS-CoV-2) nucleic acids may be present in the submitted sample  additional confirmatory testing may be necessary for epidemiological  and / or clinical management purposes  to differentiate between  SARS-CoV-2 and other Sarbecovirus currently known to infect humans.  If clinically  indicated additional testing with an alternate test  methodology [phone]) is advised. The SARS-CoV-2 RNA is generally  detectable in upper and lower respiratory sp ecimens during the acute  phase of infection. The expected result is Negative. Fact Sheet for Patients:  StrictlyIdeas.no Fact Sheet for Healthcare Providers: BankingDealers.co.za This test is not yet approved or cleared by the [place] FDA and has been authorized for detection and/or diagnosis of SARS-CoV-2 by FDA under an Emergency Use Authorization (EUA).  This EUA will remain in effect (meaning this test can be used) for the duration of the COVID-19 declaration under Section 564(b)(1) of the Act, 21 U.S.C. section 360bbb-3(b)(1), unless the authorization is terminated or revoked sooner. Performed at [facility], [address] [place], [place] [address]   Surgical PCR screen     Status: Abnormal   Collection Time: 08/02/19 10:16 PM   Specimen: Nasal Mucosa; Nasal Swab  Result Value Ref Range Status   MRSA, PCR NEGATIVE NEGATIVE Final   Staphylococcus aureus POSITIVE (A) NEGATIVE Final    Comment: (NOTE) The Xpert SA Assay (FDA approved for NASAL specimens in patients 48 years of age and older), is one component of a comprehensive surveillance program. It is not intended to diagnose infection nor to guide or monitor treatment. Performed at [facility], [address] [place], [place] [address]     RADIOLOGY:  Dg Hip Unilat With Pelvis 2-3 Views Right  Result Date: 08/03/2019 CLINICAL DATA:  Right hip hemiarthroplasty EXAM: DG HIP (WITH OR WITHOUT PELVIS) 2-3V RIGHT COMPARISON:  08/02/2019 FINDINGS: Changes of right hip hemiarthroplasty. No hardware bony complicating feature. Normal alignment. IMPRESSION: Right hip hemiarthroplasty.  No complicating feature. Electronically Signed   By: Rolm Baptise M.D.   On: 08/03/2019 21:45       Management plans  discussed with the patient, family and they are in agreement.  CODE STATUS:     Code Status Orders  (From admission, onward)         Start     Ordered   08/02/19 2215  Full code  Continuous     08/02/19 2214        TOTAL TIME TAKING CARE OF THIS PATIENT: 40 minutes.    Henreitta Leber M.D on 08/05/2019 at 2:44 PM  Between 7am to 6pm - Pager - [phone]  After 6pm go to [email or web address] - Proofreader  [facility]  Office  [phone]  CC: Primary care physician; Derinda Late, MD

## 2019-08-08 DIAGNOSIS — Z87891 Personal history of nicotine dependence: Secondary | ICD-10-CM | POA: Diagnosis not present

## 2019-08-08 DIAGNOSIS — E785 Hyperlipidemia, unspecified: Secondary | ICD-10-CM | POA: Diagnosis not present

## 2019-08-08 DIAGNOSIS — Z96641 Presence of right artificial hip joint: Secondary | ICD-10-CM | POA: Diagnosis not present

## 2019-08-08 DIAGNOSIS — I10 Essential (primary) hypertension: Secondary | ICD-10-CM | POA: Diagnosis not present

## 2019-08-08 DIAGNOSIS — Z471 Aftercare following joint replacement surgery: Secondary | ICD-10-CM | POA: Diagnosis not present

## 2019-08-08 DIAGNOSIS — K219 Gastro-esophageal reflux disease without esophagitis: Secondary | ICD-10-CM | POA: Diagnosis not present

## 2019-08-08 DIAGNOSIS — S72001D Fracture of unspecified part of neck of right femur, subsequent encounter for closed fracture with routine healing: Secondary | ICD-10-CM | POA: Diagnosis not present

## 2019-08-08 DIAGNOSIS — J309 Allergic rhinitis, unspecified: Secondary | ICD-10-CM | POA: Diagnosis not present

## 2019-08-08 DIAGNOSIS — G629 Polyneuropathy, unspecified: Secondary | ICD-10-CM | POA: Diagnosis not present

## 2019-08-08 DIAGNOSIS — Z9181 History of falling: Secondary | ICD-10-CM | POA: Diagnosis not present

## 2019-08-08 DIAGNOSIS — F419 Anxiety disorder, unspecified: Secondary | ICD-10-CM | POA: Diagnosis not present

## 2019-08-08 DIAGNOSIS — M1990 Unspecified osteoarthritis, unspecified site: Secondary | ICD-10-CM | POA: Diagnosis not present

## 2019-08-08 DIAGNOSIS — F329 Major depressive disorder, single episode, unspecified: Secondary | ICD-10-CM | POA: Diagnosis not present

## 2019-08-13 DIAGNOSIS — D62 Acute posthemorrhagic anemia: Secondary | ICD-10-CM | POA: Diagnosis not present

## 2019-08-13 DIAGNOSIS — Z23 Encounter for immunization: Secondary | ICD-10-CM | POA: Diagnosis not present

## 2019-08-13 DIAGNOSIS — S72001D Fracture of unspecified part of neck of right femur, subsequent encounter for closed fracture with routine healing: Secondary | ICD-10-CM | POA: Diagnosis not present

## 2019-08-14 DIAGNOSIS — F329 Major depressive disorder, single episode, unspecified: Secondary | ICD-10-CM | POA: Diagnosis not present

## 2019-08-14 DIAGNOSIS — E785 Hyperlipidemia, unspecified: Secondary | ICD-10-CM | POA: Diagnosis not present

## 2019-08-14 DIAGNOSIS — Z87891 Personal history of nicotine dependence: Secondary | ICD-10-CM | POA: Diagnosis not present

## 2019-08-14 DIAGNOSIS — I10 Essential (primary) hypertension: Secondary | ICD-10-CM | POA: Diagnosis not present

## 2019-08-14 DIAGNOSIS — M1990 Unspecified osteoarthritis, unspecified site: Secondary | ICD-10-CM | POA: Diagnosis not present

## 2019-08-14 DIAGNOSIS — Z471 Aftercare following joint replacement surgery: Secondary | ICD-10-CM | POA: Diagnosis not present

## 2019-08-14 DIAGNOSIS — G629 Polyneuropathy, unspecified: Secondary | ICD-10-CM | POA: Diagnosis not present

## 2019-08-14 DIAGNOSIS — Z96641 Presence of right artificial hip joint: Secondary | ICD-10-CM | POA: Diagnosis not present

## 2019-08-14 DIAGNOSIS — F419 Anxiety disorder, unspecified: Secondary | ICD-10-CM | POA: Diagnosis not present

## 2019-08-14 DIAGNOSIS — J309 Allergic rhinitis, unspecified: Secondary | ICD-10-CM | POA: Diagnosis not present

## 2019-08-14 DIAGNOSIS — Z9181 History of falling: Secondary | ICD-10-CM | POA: Diagnosis not present

## 2019-08-14 DIAGNOSIS — S72001D Fracture of unspecified part of neck of right femur, subsequent encounter for closed fracture with routine healing: Secondary | ICD-10-CM | POA: Diagnosis not present

## 2019-08-14 DIAGNOSIS — K219 Gastro-esophageal reflux disease without esophagitis: Secondary | ICD-10-CM | POA: Diagnosis not present

## 2019-08-28 DIAGNOSIS — G629 Polyneuropathy, unspecified: Secondary | ICD-10-CM | POA: Diagnosis not present

## 2019-08-28 DIAGNOSIS — J309 Allergic rhinitis, unspecified: Secondary | ICD-10-CM | POA: Diagnosis not present

## 2019-08-28 DIAGNOSIS — F419 Anxiety disorder, unspecified: Secondary | ICD-10-CM | POA: Diagnosis not present

## 2019-08-28 DIAGNOSIS — E785 Hyperlipidemia, unspecified: Secondary | ICD-10-CM | POA: Diagnosis not present

## 2019-08-28 DIAGNOSIS — M1990 Unspecified osteoarthritis, unspecified site: Secondary | ICD-10-CM | POA: Diagnosis not present

## 2019-08-28 DIAGNOSIS — I10 Essential (primary) hypertension: Secondary | ICD-10-CM | POA: Diagnosis not present

## 2019-08-28 DIAGNOSIS — Z87891 Personal history of nicotine dependence: Secondary | ICD-10-CM | POA: Diagnosis not present

## 2019-08-28 DIAGNOSIS — Z96641 Presence of right artificial hip joint: Secondary | ICD-10-CM | POA: Diagnosis not present

## 2019-08-28 DIAGNOSIS — Z471 Aftercare following joint replacement surgery: Secondary | ICD-10-CM | POA: Diagnosis not present

## 2019-08-28 DIAGNOSIS — S72001D Fracture of unspecified part of neck of right femur, subsequent encounter for closed fracture with routine healing: Secondary | ICD-10-CM | POA: Diagnosis not present

## 2019-08-28 DIAGNOSIS — F329 Major depressive disorder, single episode, unspecified: Secondary | ICD-10-CM | POA: Diagnosis not present

## 2019-08-28 DIAGNOSIS — Z9181 History of falling: Secondary | ICD-10-CM | POA: Diagnosis not present

## 2019-08-28 DIAGNOSIS — K219 Gastro-esophageal reflux disease without esophagitis: Secondary | ICD-10-CM | POA: Diagnosis not present

## 2019-09-01 DIAGNOSIS — I10 Essential (primary) hypertension: Secondary | ICD-10-CM | POA: Diagnosis not present

## 2019-09-01 DIAGNOSIS — M4316 Spondylolisthesis, lumbar region: Secondary | ICD-10-CM | POA: Diagnosis not present

## 2019-09-01 DIAGNOSIS — M4317 Spondylolisthesis, lumbosacral region: Secondary | ICD-10-CM | POA: Diagnosis not present

## 2019-09-15 DIAGNOSIS — S72001D Fracture of unspecified part of neck of right femur, subsequent encounter for closed fracture with routine healing: Secondary | ICD-10-CM | POA: Diagnosis not present

## 2019-09-15 DIAGNOSIS — Z96641 Presence of right artificial hip joint: Secondary | ICD-10-CM | POA: Diagnosis not present

## 2019-09-19 DIAGNOSIS — Z96649 Presence of unspecified artificial hip joint: Secondary | ICD-10-CM | POA: Insufficient documentation

## 2019-09-21 DIAGNOSIS — Z471 Aftercare following joint replacement surgery: Secondary | ICD-10-CM | POA: Diagnosis not present

## 2019-10-29 DIAGNOSIS — S72001D Fracture of unspecified part of neck of right femur, subsequent encounter for closed fracture with routine healing: Secondary | ICD-10-CM | POA: Diagnosis not present

## 2019-12-01 DIAGNOSIS — E7800 Pure hypercholesterolemia, unspecified: Secondary | ICD-10-CM | POA: Diagnosis not present

## 2019-12-01 DIAGNOSIS — Z79899 Other long term (current) drug therapy: Secondary | ICD-10-CM | POA: Diagnosis not present

## 2019-12-01 DIAGNOSIS — Z0000 Encounter for general adult medical examination without abnormal findings: Secondary | ICD-10-CM | POA: Diagnosis not present

## 2020-01-04 DIAGNOSIS — R141 Gas pain: Secondary | ICD-10-CM | POA: Diagnosis not present

## 2020-01-04 DIAGNOSIS — M810 Age-related osteoporosis without current pathological fracture: Secondary | ICD-10-CM | POA: Diagnosis not present

## 2020-01-04 DIAGNOSIS — K5900 Constipation, unspecified: Secondary | ICD-10-CM | POA: Diagnosis not present

## 2020-01-04 DIAGNOSIS — K219 Gastro-esophageal reflux disease without esophagitis: Secondary | ICD-10-CM | POA: Diagnosis not present

## 2020-01-04 DIAGNOSIS — K449 Diaphragmatic hernia without obstruction or gangrene: Secondary | ICD-10-CM | POA: Diagnosis not present

## 2020-01-04 DIAGNOSIS — K222 Esophageal obstruction: Secondary | ICD-10-CM | POA: Diagnosis not present

## 2020-02-29 ENCOUNTER — Other Ambulatory Visit: Payer: Self-pay | Admitting: Family Medicine

## 2020-02-29 DIAGNOSIS — Z1231 Encounter for screening mammogram for malignant neoplasm of breast: Secondary | ICD-10-CM

## 2020-03-02 ENCOUNTER — Ambulatory Visit
Admission: RE | Admit: 2020-03-02 | Discharge: 2020-03-02 | Disposition: A | Discharge status: Home / Self Care | Payer: PPO | Source: Ambulatory Visit | Attending: Family Medicine | Admitting: Family Medicine

## 2020-03-02 DIAGNOSIS — Z1231 Encounter for screening mammogram for malignant neoplasm of breast: Secondary | ICD-10-CM | POA: Diagnosis not present

## 2020-05-23 DIAGNOSIS — Z0000 Encounter for general adult medical examination without abnormal findings: Secondary | ICD-10-CM | POA: Diagnosis not present

## 2020-05-23 DIAGNOSIS — E7800 Pure hypercholesterolemia, unspecified: Secondary | ICD-10-CM | POA: Diagnosis not present

## 2020-05-30 DIAGNOSIS — F419 Anxiety disorder, unspecified: Secondary | ICD-10-CM | POA: Diagnosis not present

## 2020-05-30 DIAGNOSIS — E7800 Pure hypercholesterolemia, unspecified: Secondary | ICD-10-CM | POA: Diagnosis not present

## 2020-05-30 DIAGNOSIS — I10 Essential (primary) hypertension: Secondary | ICD-10-CM | POA: Diagnosis not present

## 2020-05-30 DIAGNOSIS — M545 Low back pain: Secondary | ICD-10-CM | POA: Diagnosis not present

## 2020-05-30 DIAGNOSIS — G8929 Other chronic pain: Secondary | ICD-10-CM | POA: Diagnosis not present

## 2020-05-30 DIAGNOSIS — Z79899 Other long term (current) drug therapy: Secondary | ICD-10-CM | POA: Diagnosis not present

## 2020-05-30 DIAGNOSIS — R739 Hyperglycemia, unspecified: Secondary | ICD-10-CM | POA: Diagnosis not present

## 2020-07-26 DIAGNOSIS — M4316 Spondylolisthesis, lumbar region: Secondary | ICD-10-CM | POA: Diagnosis not present

## 2020-09-20 DIAGNOSIS — G8911 Acute pain due to trauma: Secondary | ICD-10-CM | POA: Diagnosis not present

## 2020-09-20 DIAGNOSIS — M810 Age-related osteoporosis without current pathological fracture: Secondary | ICD-10-CM | POA: Diagnosis not present

## 2020-09-20 DIAGNOSIS — Z9181 History of falling: Secondary | ICD-10-CM | POA: Diagnosis not present

## 2020-09-20 DIAGNOSIS — S72001D Fracture of unspecified part of neck of right femur, subsequent encounter for closed fracture with routine healing: Secondary | ICD-10-CM | POA: Diagnosis not present

## 2020-09-20 DIAGNOSIS — M25511 Pain in right shoulder: Secondary | ICD-10-CM | POA: Diagnosis not present

## 2020-10-24 DIAGNOSIS — J0100 Acute maxillary sinusitis, unspecified: Secondary | ICD-10-CM | POA: Diagnosis not present

## 2020-10-24 DIAGNOSIS — I10 Essential (primary) hypertension: Secondary | ICD-10-CM | POA: Diagnosis not present

## 2020-12-09 DIAGNOSIS — E7800 Pure hypercholesterolemia, unspecified: Secondary | ICD-10-CM | POA: Diagnosis not present

## 2020-12-09 DIAGNOSIS — Z79899 Other long term (current) drug therapy: Secondary | ICD-10-CM | POA: Diagnosis not present

## 2020-12-09 DIAGNOSIS — R739 Hyperglycemia, unspecified: Secondary | ICD-10-CM | POA: Diagnosis not present

## 2020-12-16 DIAGNOSIS — U071 COVID-19: Secondary | ICD-10-CM | POA: Diagnosis not present

## 2020-12-16 DIAGNOSIS — Z20822 Contact with and (suspected) exposure to covid-19: Secondary | ICD-10-CM | POA: Diagnosis not present

## 2020-12-20 DIAGNOSIS — Z23 Encounter for immunization: Secondary | ICD-10-CM | POA: Diagnosis not present

## 2020-12-20 DIAGNOSIS — Z1331 Encounter for screening for depression: Secondary | ICD-10-CM | POA: Diagnosis not present

## 2020-12-20 DIAGNOSIS — Z0000 Encounter for general adult medical examination without abnormal findings: Secondary | ICD-10-CM | POA: Diagnosis not present

## 2021-01-04 DIAGNOSIS — R141 Gas pain: Secondary | ICD-10-CM | POA: Diagnosis not present

## 2021-01-04 DIAGNOSIS — K219 Gastro-esophageal reflux disease without esophagitis: Secondary | ICD-10-CM | POA: Diagnosis not present

## 2021-01-04 DIAGNOSIS — K5900 Constipation, unspecified: Secondary | ICD-10-CM | POA: Diagnosis not present

## 2021-01-04 DIAGNOSIS — K449 Diaphragmatic hernia without obstruction or gangrene: Secondary | ICD-10-CM | POA: Diagnosis not present

## 2021-01-04 DIAGNOSIS — K222 Esophageal obstruction: Secondary | ICD-10-CM | POA: Diagnosis not present

## 2021-01-27 DIAGNOSIS — S46011A Strain of muscle(s) and tendon(s) of the rotator cuff of right shoulder, initial encounter: Secondary | ICD-10-CM | POA: Diagnosis not present

## 2021-01-27 DIAGNOSIS — Z9181 History of falling: Secondary | ICD-10-CM | POA: Diagnosis not present

## 2021-02-10 DIAGNOSIS — Z96641 Presence of right artificial hip joint: Secondary | ICD-10-CM | POA: Diagnosis not present

## 2021-02-10 DIAGNOSIS — M5090 Cervical disc disorder, unspecified, unspecified cervical region: Secondary | ICD-10-CM | POA: Diagnosis not present

## 2021-02-10 DIAGNOSIS — I10 Essential (primary) hypertension: Secondary | ICD-10-CM | POA: Diagnosis not present

## 2021-02-10 DIAGNOSIS — Z9181 History of falling: Secondary | ICD-10-CM | POA: Diagnosis not present

## 2021-02-10 DIAGNOSIS — M5136 Other intervertebral disc degeneration, lumbar region: Secondary | ICD-10-CM | POA: Diagnosis not present

## 2021-02-10 DIAGNOSIS — S46011D Strain of muscle(s) and tendon(s) of the rotator cuff of right shoulder, subsequent encounter: Secondary | ICD-10-CM | POA: Diagnosis not present

## 2021-02-10 DIAGNOSIS — K2970 Gastritis, unspecified, without bleeding: Secondary | ICD-10-CM | POA: Diagnosis not present

## 2021-02-10 DIAGNOSIS — M19042 Primary osteoarthritis, left hand: Secondary | ICD-10-CM | POA: Diagnosis not present

## 2021-02-10 DIAGNOSIS — M8580 Other specified disorders of bone density and structure, unspecified site: Secondary | ICD-10-CM | POA: Diagnosis not present

## 2021-02-10 DIAGNOSIS — E785 Hyperlipidemia, unspecified: Secondary | ICD-10-CM | POA: Diagnosis not present

## 2021-02-10 DIAGNOSIS — Z7982 Long term (current) use of aspirin: Secondary | ICD-10-CM | POA: Diagnosis not present

## 2021-02-10 DIAGNOSIS — M19041 Primary osteoarthritis, right hand: Secondary | ICD-10-CM | POA: Diagnosis not present

## 2021-02-13 DIAGNOSIS — S46011D Strain of muscle(s) and tendon(s) of the rotator cuff of right shoulder, subsequent encounter: Secondary | ICD-10-CM | POA: Diagnosis not present

## 2021-02-15 DIAGNOSIS — K2970 Gastritis, unspecified, without bleeding: Secondary | ICD-10-CM | POA: Diagnosis not present

## 2021-02-15 DIAGNOSIS — I10 Essential (primary) hypertension: Secondary | ICD-10-CM | POA: Diagnosis not present

## 2021-02-15 DIAGNOSIS — Z9181 History of falling: Secondary | ICD-10-CM | POA: Diagnosis not present

## 2021-02-15 DIAGNOSIS — M8580 Other specified disorders of bone density and structure, unspecified site: Secondary | ICD-10-CM | POA: Diagnosis not present

## 2021-02-15 DIAGNOSIS — Z96641 Presence of right artificial hip joint: Secondary | ICD-10-CM | POA: Diagnosis not present

## 2021-02-15 DIAGNOSIS — M5136 Other intervertebral disc degeneration, lumbar region: Secondary | ICD-10-CM | POA: Diagnosis not present

## 2021-02-15 DIAGNOSIS — Z7982 Long term (current) use of aspirin: Secondary | ICD-10-CM | POA: Diagnosis not present

## 2021-02-15 DIAGNOSIS — S46011D Strain of muscle(s) and tendon(s) of the rotator cuff of right shoulder, subsequent encounter: Secondary | ICD-10-CM | POA: Diagnosis not present

## 2021-02-15 DIAGNOSIS — M19041 Primary osteoarthritis, right hand: Secondary | ICD-10-CM | POA: Diagnosis not present

## 2021-02-15 DIAGNOSIS — E785 Hyperlipidemia, unspecified: Secondary | ICD-10-CM | POA: Diagnosis not present

## 2021-02-15 DIAGNOSIS — M19042 Primary osteoarthritis, left hand: Secondary | ICD-10-CM | POA: Diagnosis not present

## 2021-02-15 DIAGNOSIS — M5090 Cervical disc disorder, unspecified, unspecified cervical region: Secondary | ICD-10-CM | POA: Diagnosis not present

## 2021-02-28 DIAGNOSIS — E785 Hyperlipidemia, unspecified: Secondary | ICD-10-CM | POA: Diagnosis not present

## 2021-02-28 DIAGNOSIS — I10 Essential (primary) hypertension: Secondary | ICD-10-CM | POA: Diagnosis not present

## 2021-02-28 DIAGNOSIS — K2970 Gastritis, unspecified, without bleeding: Secondary | ICD-10-CM | POA: Diagnosis not present

## 2021-02-28 DIAGNOSIS — M5090 Cervical disc disorder, unspecified, unspecified cervical region: Secondary | ICD-10-CM | POA: Diagnosis not present

## 2021-02-28 DIAGNOSIS — M19042 Primary osteoarthritis, left hand: Secondary | ICD-10-CM | POA: Diagnosis not present

## 2021-02-28 DIAGNOSIS — Z9181 History of falling: Secondary | ICD-10-CM | POA: Diagnosis not present

## 2021-02-28 DIAGNOSIS — M19041 Primary osteoarthritis, right hand: Secondary | ICD-10-CM | POA: Diagnosis not present

## 2021-02-28 DIAGNOSIS — S46011D Strain of muscle(s) and tendon(s) of the rotator cuff of right shoulder, subsequent encounter: Secondary | ICD-10-CM | POA: Diagnosis not present

## 2021-02-28 DIAGNOSIS — Z96641 Presence of right artificial hip joint: Secondary | ICD-10-CM | POA: Diagnosis not present

## 2021-02-28 DIAGNOSIS — M8580 Other specified disorders of bone density and structure, unspecified site: Secondary | ICD-10-CM | POA: Diagnosis not present

## 2021-02-28 DIAGNOSIS — Z7982 Long term (current) use of aspirin: Secondary | ICD-10-CM | POA: Diagnosis not present

## 2021-02-28 DIAGNOSIS — M5136 Other intervertebral disc degeneration, lumbar region: Secondary | ICD-10-CM | POA: Diagnosis not present

## 2021-03-10 DIAGNOSIS — M7581 Other shoulder lesions, right shoulder: Secondary | ICD-10-CM | POA: Diagnosis not present

## 2021-03-10 DIAGNOSIS — M7521 Bicipital tendinitis, right shoulder: Secondary | ICD-10-CM | POA: Diagnosis not present

## 2021-03-10 DIAGNOSIS — S46011A Strain of muscle(s) and tendon(s) of the rotator cuff of right shoulder, initial encounter: Secondary | ICD-10-CM | POA: Diagnosis not present

## 2021-03-14 DIAGNOSIS — M19041 Primary osteoarthritis, right hand: Secondary | ICD-10-CM | POA: Diagnosis not present

## 2021-03-14 DIAGNOSIS — S46011D Strain of muscle(s) and tendon(s) of the rotator cuff of right shoulder, subsequent encounter: Secondary | ICD-10-CM | POA: Diagnosis not present

## 2021-03-14 DIAGNOSIS — K2970 Gastritis, unspecified, without bleeding: Secondary | ICD-10-CM | POA: Diagnosis not present

## 2021-03-14 DIAGNOSIS — M5090 Cervical disc disorder, unspecified, unspecified cervical region: Secondary | ICD-10-CM | POA: Diagnosis not present

## 2021-03-14 DIAGNOSIS — Z96641 Presence of right artificial hip joint: Secondary | ICD-10-CM | POA: Diagnosis not present

## 2021-03-14 DIAGNOSIS — I10 Essential (primary) hypertension: Secondary | ICD-10-CM | POA: Diagnosis not present

## 2021-03-14 DIAGNOSIS — Z7982 Long term (current) use of aspirin: Secondary | ICD-10-CM | POA: Diagnosis not present

## 2021-03-14 DIAGNOSIS — Z9181 History of falling: Secondary | ICD-10-CM | POA: Diagnosis not present

## 2021-03-14 DIAGNOSIS — M8580 Other specified disorders of bone density and structure, unspecified site: Secondary | ICD-10-CM | POA: Diagnosis not present

## 2021-03-14 DIAGNOSIS — E785 Hyperlipidemia, unspecified: Secondary | ICD-10-CM | POA: Diagnosis not present

## 2021-03-14 DIAGNOSIS — M5136 Other intervertebral disc degeneration, lumbar region: Secondary | ICD-10-CM | POA: Diagnosis not present

## 2021-03-14 DIAGNOSIS — M19042 Primary osteoarthritis, left hand: Secondary | ICD-10-CM | POA: Diagnosis not present

## 2021-03-28 DIAGNOSIS — Z96641 Presence of right artificial hip joint: Secondary | ICD-10-CM | POA: Diagnosis not present

## 2021-03-28 DIAGNOSIS — Z9181 History of falling: Secondary | ICD-10-CM | POA: Diagnosis not present

## 2021-03-28 DIAGNOSIS — M19042 Primary osteoarthritis, left hand: Secondary | ICD-10-CM | POA: Diagnosis not present

## 2021-03-28 DIAGNOSIS — I10 Essential (primary) hypertension: Secondary | ICD-10-CM | POA: Diagnosis not present

## 2021-03-28 DIAGNOSIS — Z7982 Long term (current) use of aspirin: Secondary | ICD-10-CM | POA: Diagnosis not present

## 2021-03-28 DIAGNOSIS — S46011D Strain of muscle(s) and tendon(s) of the rotator cuff of right shoulder, subsequent encounter: Secondary | ICD-10-CM | POA: Diagnosis not present

## 2021-03-28 DIAGNOSIS — M19041 Primary osteoarthritis, right hand: Secondary | ICD-10-CM | POA: Diagnosis not present

## 2021-03-28 DIAGNOSIS — K2970 Gastritis, unspecified, without bleeding: Secondary | ICD-10-CM | POA: Diagnosis not present

## 2021-03-28 DIAGNOSIS — M5136 Other intervertebral disc degeneration, lumbar region: Secondary | ICD-10-CM | POA: Diagnosis not present

## 2021-03-28 DIAGNOSIS — M5090 Cervical disc disorder, unspecified, unspecified cervical region: Secondary | ICD-10-CM | POA: Diagnosis not present

## 2021-03-28 DIAGNOSIS — E785 Hyperlipidemia, unspecified: Secondary | ICD-10-CM | POA: Diagnosis not present

## 2021-03-28 DIAGNOSIS — M8580 Other specified disorders of bone density and structure, unspecified site: Secondary | ICD-10-CM | POA: Diagnosis not present

## 2021-05-22 ENCOUNTER — Other Ambulatory Visit: Payer: Self-pay | Admitting: Family Medicine

## 2021-05-22 DIAGNOSIS — Z1231 Encounter for screening mammogram for malignant neoplasm of breast: Secondary | ICD-10-CM

## 2021-05-31 ENCOUNTER — Ambulatory Visit
Admission: RE | Admit: 2021-05-31 | Discharge: 2021-05-31 | Disposition: A | Discharge status: Home / Self Care | Payer: PPO | Source: Ambulatory Visit | Attending: Family Medicine | Admitting: Family Medicine

## 2021-05-31 ENCOUNTER — Other Ambulatory Visit: Payer: Self-pay

## 2021-05-31 DIAGNOSIS — Z1231 Encounter for screening mammogram for malignant neoplasm of breast: Secondary | ICD-10-CM | POA: Insufficient documentation

## 2021-06-15 DIAGNOSIS — Z79899 Other long term (current) drug therapy: Secondary | ICD-10-CM | POA: Diagnosis not present

## 2021-06-15 DIAGNOSIS — R739 Hyperglycemia, unspecified: Secondary | ICD-10-CM | POA: Diagnosis not present

## 2021-06-15 DIAGNOSIS — E7800 Pure hypercholesterolemia, unspecified: Secondary | ICD-10-CM | POA: Diagnosis not present

## 2021-06-20 DIAGNOSIS — M7071 Other bursitis of hip, right hip: Secondary | ICD-10-CM | POA: Diagnosis not present

## 2021-06-20 DIAGNOSIS — M25551 Pain in right hip: Secondary | ICD-10-CM | POA: Diagnosis not present

## 2021-06-22 DIAGNOSIS — I10 Essential (primary) hypertension: Secondary | ICD-10-CM | POA: Diagnosis not present

## 2021-06-22 DIAGNOSIS — R739 Hyperglycemia, unspecified: Secondary | ICD-10-CM | POA: Diagnosis not present

## 2021-06-22 DIAGNOSIS — F419 Anxiety disorder, unspecified: Secondary | ICD-10-CM | POA: Diagnosis not present

## 2021-06-22 DIAGNOSIS — E7800 Pure hypercholesterolemia, unspecified: Secondary | ICD-10-CM | POA: Diagnosis not present

## 2021-06-22 DIAGNOSIS — G8929 Other chronic pain: Secondary | ICD-10-CM | POA: Diagnosis not present

## 2021-06-22 DIAGNOSIS — Z79899 Other long term (current) drug therapy: Secondary | ICD-10-CM | POA: Diagnosis not present

## 2021-06-22 DIAGNOSIS — M545 Low back pain, unspecified: Secondary | ICD-10-CM | POA: Diagnosis not present

## 2021-06-22 DIAGNOSIS — M810 Age-related osteoporosis without current pathological fracture: Secondary | ICD-10-CM | POA: Diagnosis not present

## 2021-11-22 DIAGNOSIS — J0100 Acute maxillary sinusitis, unspecified: Secondary | ICD-10-CM | POA: Diagnosis not present

## 2021-12-08 IMAGING — MG MM DIGITAL SCREENING BILAT W/ TOMO AND CAD
8 series · 9 of 24 positions shown · non-contrast
Comparison: Previous exam(s).

CLINICAL DATA: Screening.

EXAM:
DIGITAL SCREENING BILATERAL MAMMOGRAM WITH TOMOSYNTHESIS AND CAD
TECHNIQUE: Bilateral screening digital craniocaudal and mediolateral oblique
mammograms were obtained. Bilateral screening digital breast
tomosynthesis was performed. The images were evaluated with
computer-aided detection.

[L MLO synth-2D]
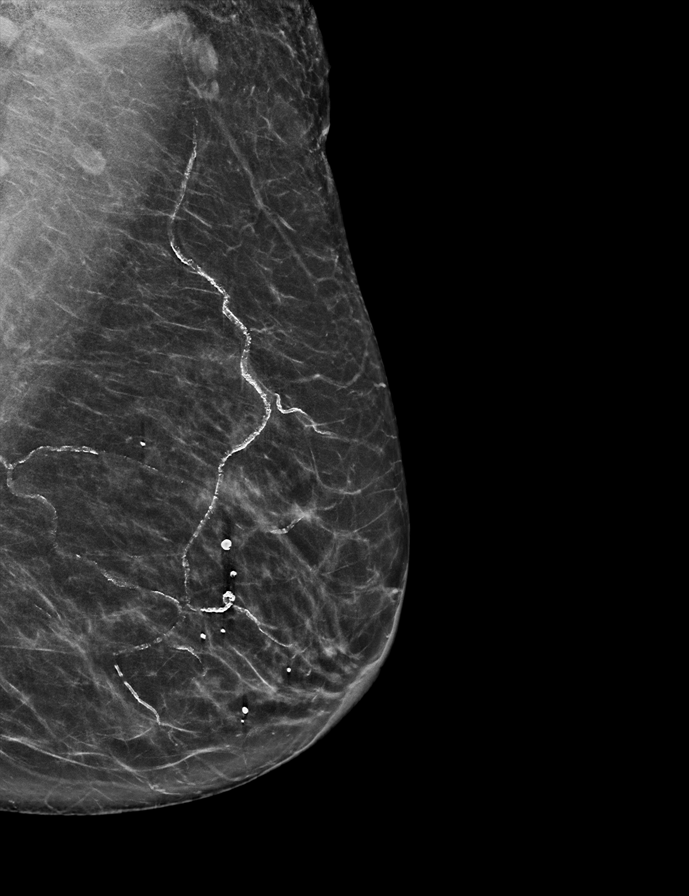

[L CC synth-2D]
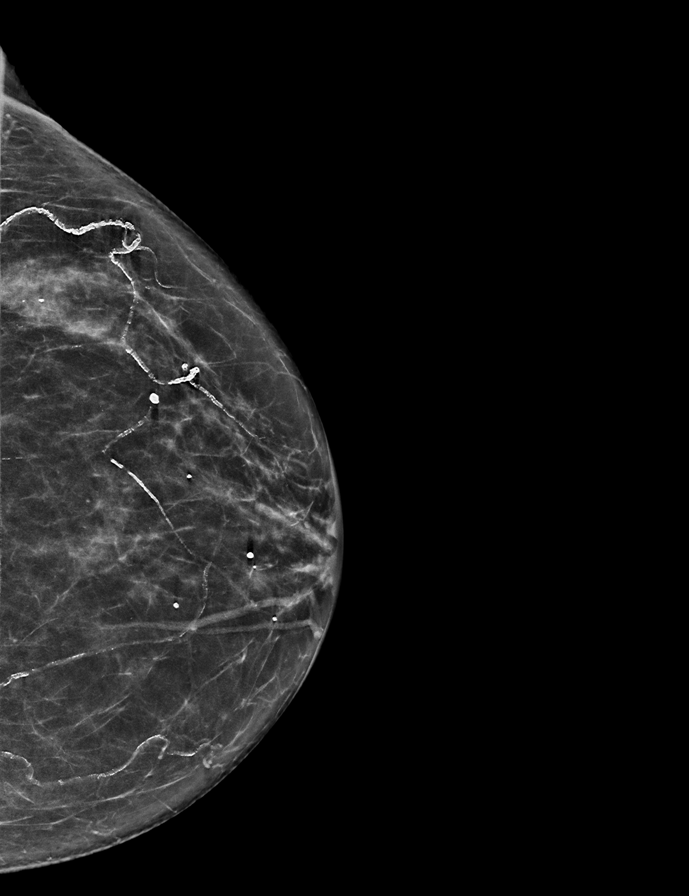

[R MLO synth-2D]
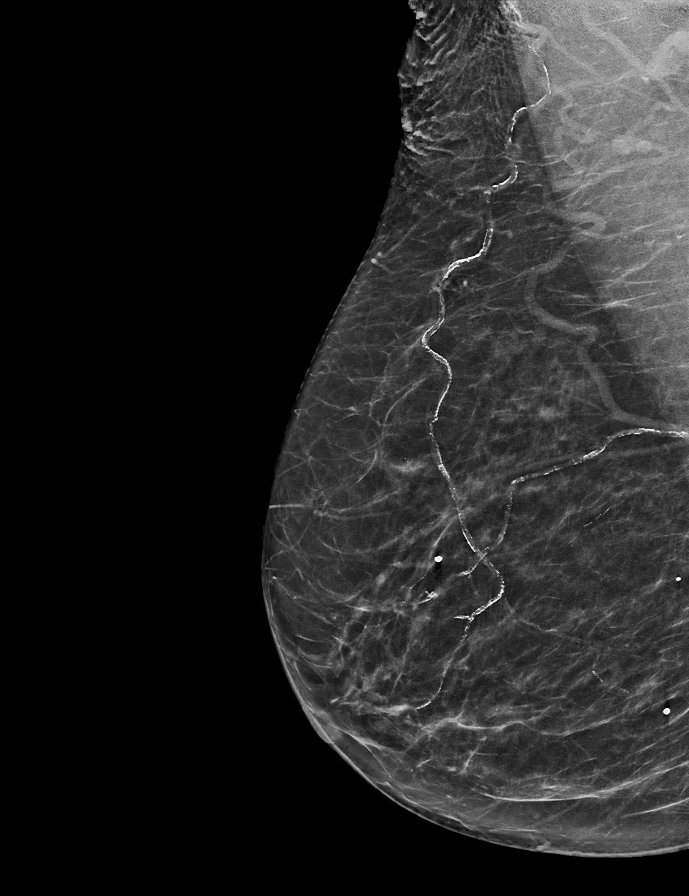

[R CC synth-2D]
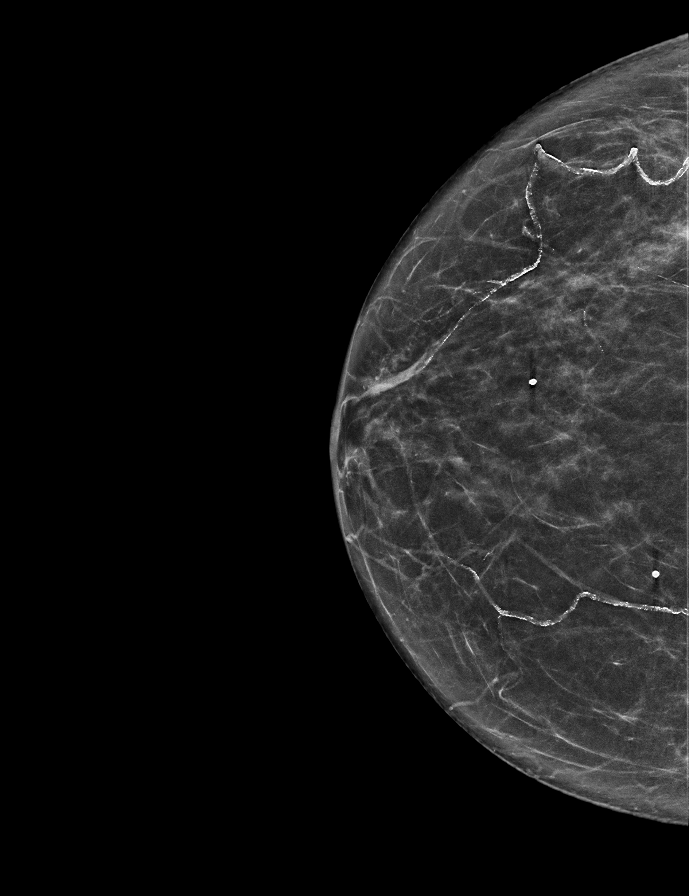

[R CC tomo · 2 of 61 frames shown]
[frame 20/61]
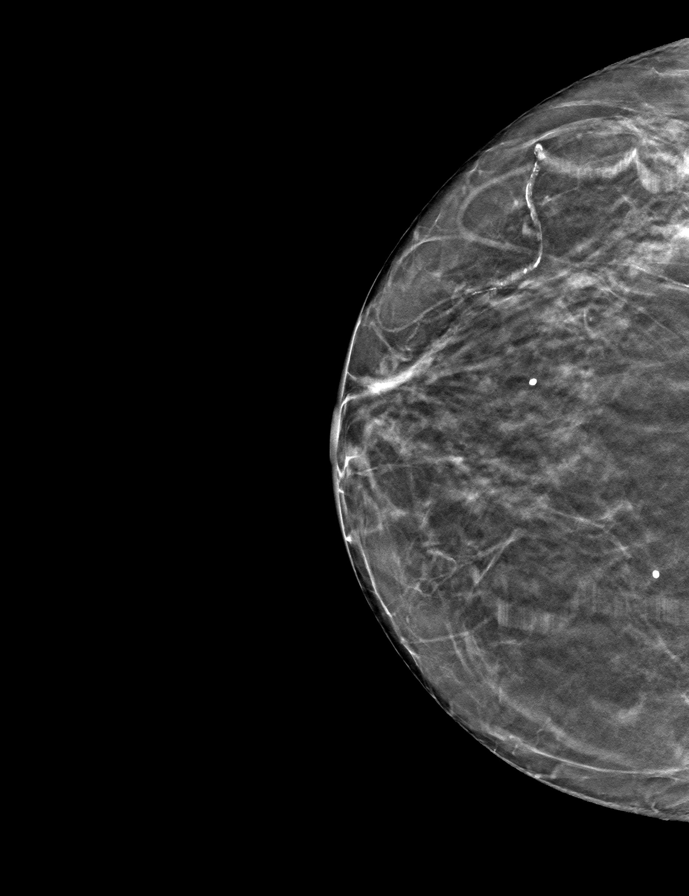
[frame 31/61]
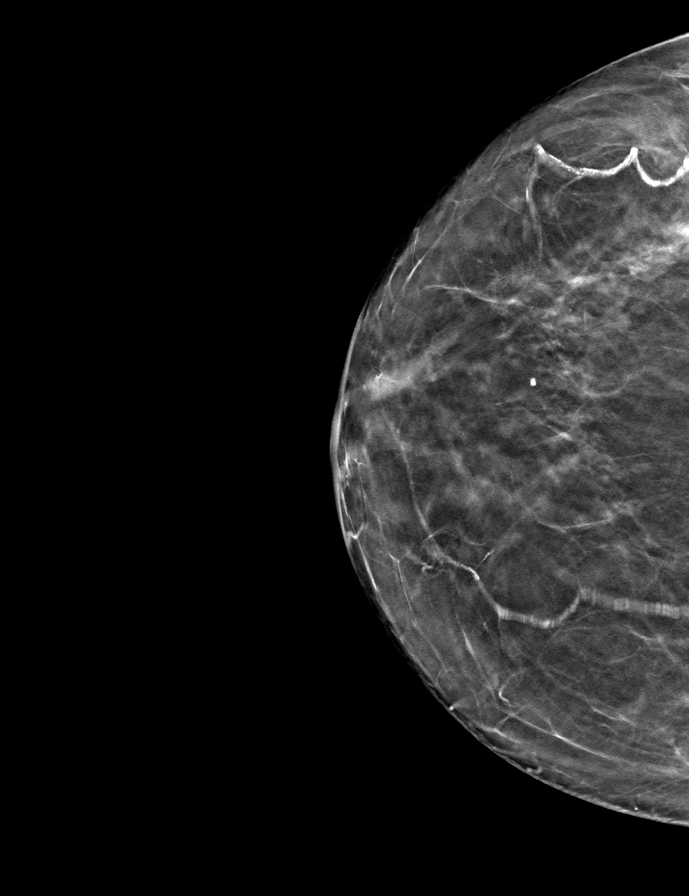

[L CC tomo · tomo slice 30/59.0]
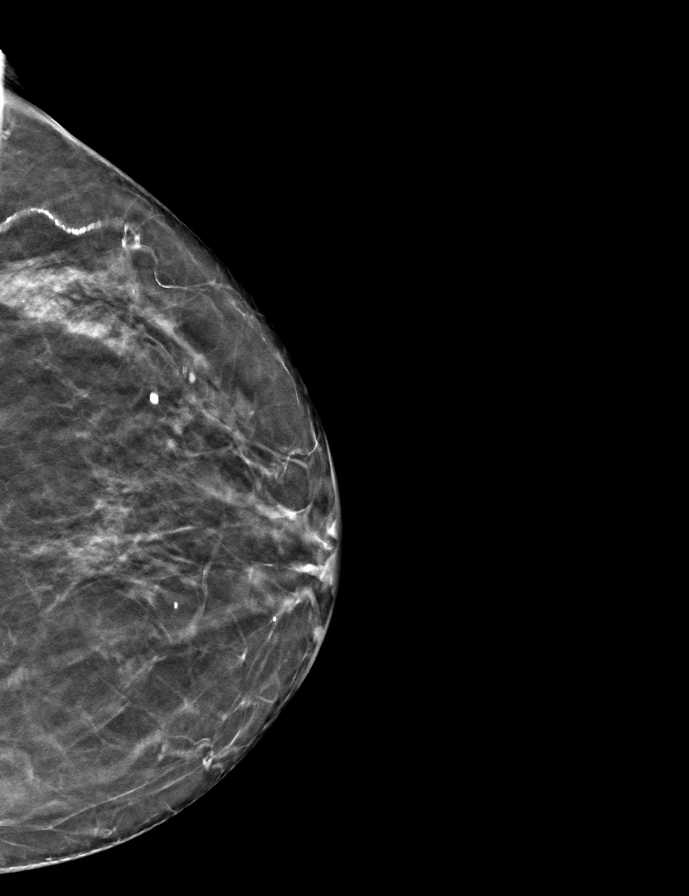

[L MLO tomo · tomo slice 34/67.0]
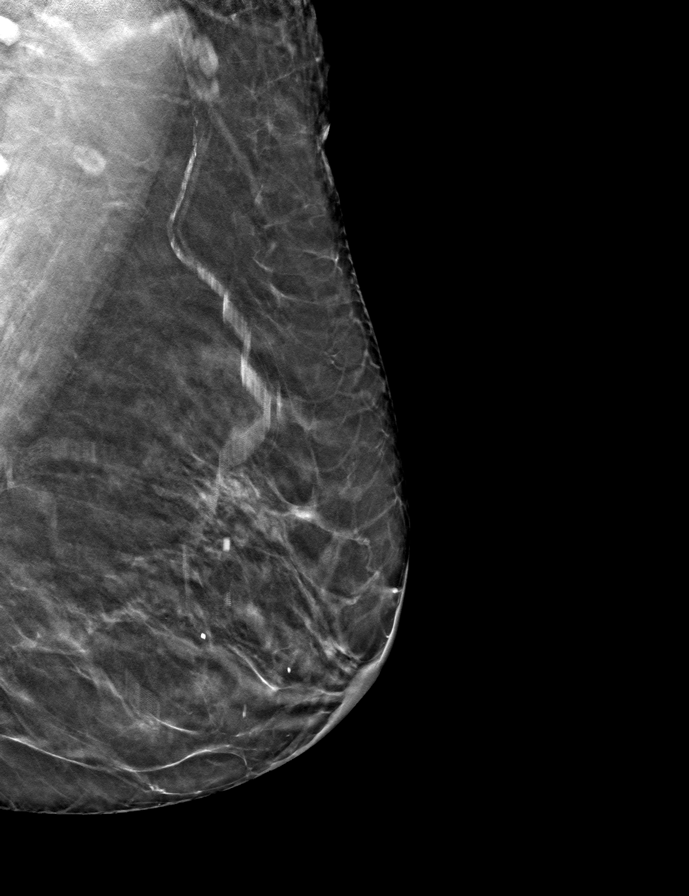

[R MLO tomo · tomo slice 34/67.0]
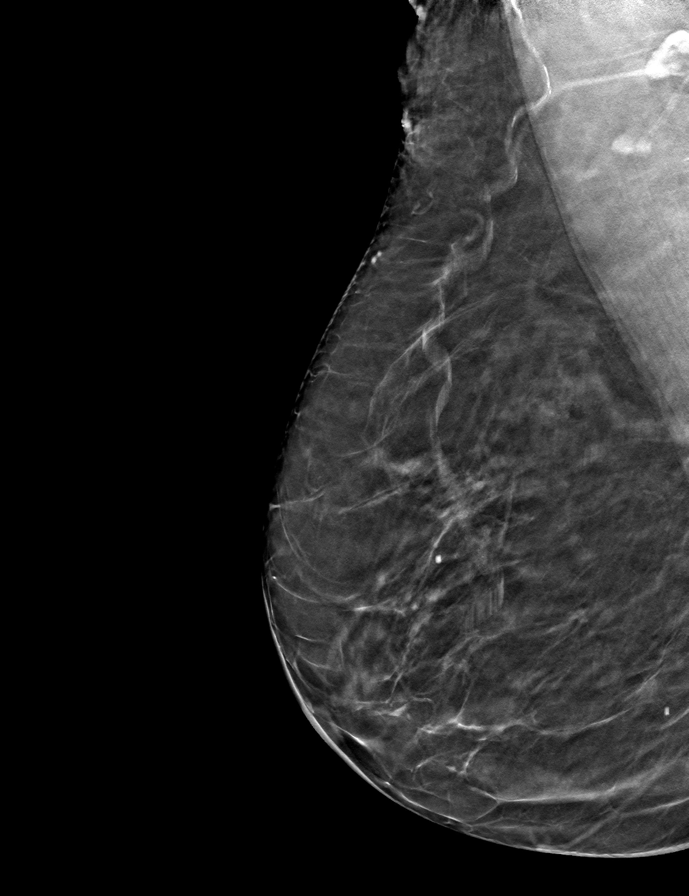

[9 of 24 positions shown; findings below may reference images not displayed]

ACR Breast Density Category b: There are scattered areas of
fibroglandular density.
FINDINGS: There are no findings suspicious for malignancy.
IMPRESSION: No mammographic evidence of malignancy. A result letter of this
screening mammogram will be mailed directly to the patient.

RECOMMENDATION:
Screening mammogram in one year. (Code:51-O-LD2)

BI-RADS CATEGORY  1: Negative.

## 2021-12-21 DIAGNOSIS — R739 Hyperglycemia, unspecified: Secondary | ICD-10-CM | POA: Diagnosis not present

## 2021-12-21 DIAGNOSIS — E7800 Pure hypercholesterolemia, unspecified: Secondary | ICD-10-CM | POA: Diagnosis not present

## 2021-12-21 DIAGNOSIS — Z79899 Other long term (current) drug therapy: Secondary | ICD-10-CM | POA: Diagnosis not present

## 2021-12-25 DIAGNOSIS — M7521 Bicipital tendinitis, right shoulder: Secondary | ICD-10-CM | POA: Diagnosis not present

## 2021-12-25 DIAGNOSIS — S46011D Strain of muscle(s) and tendon(s) of the rotator cuff of right shoulder, subsequent encounter: Secondary | ICD-10-CM | POA: Diagnosis not present

## 2021-12-25 DIAGNOSIS — M7581 Other shoulder lesions, right shoulder: Secondary | ICD-10-CM | POA: Diagnosis not present

## 2021-12-28 DIAGNOSIS — Z1331 Encounter for screening for depression: Secondary | ICD-10-CM | POA: Diagnosis not present

## 2021-12-28 DIAGNOSIS — R739 Hyperglycemia, unspecified: Secondary | ICD-10-CM | POA: Diagnosis not present

## 2021-12-28 DIAGNOSIS — I10 Essential (primary) hypertension: Secondary | ICD-10-CM | POA: Diagnosis not present

## 2021-12-28 DIAGNOSIS — F419 Anxiety disorder, unspecified: Secondary | ICD-10-CM | POA: Diagnosis not present

## 2021-12-28 DIAGNOSIS — Z79899 Other long term (current) drug therapy: Secondary | ICD-10-CM | POA: Diagnosis not present

## 2021-12-28 DIAGNOSIS — E7800 Pure hypercholesterolemia, unspecified: Secondary | ICD-10-CM | POA: Diagnosis not present

## 2021-12-28 DIAGNOSIS — Z0000 Encounter for general adult medical examination without abnormal findings: Secondary | ICD-10-CM | POA: Diagnosis not present

## 2022-01-04 DIAGNOSIS — R141 Gas pain: Secondary | ICD-10-CM | POA: Diagnosis not present

## 2022-01-04 DIAGNOSIS — K5909 Other constipation: Secondary | ICD-10-CM | POA: Diagnosis not present

## 2022-01-04 DIAGNOSIS — K222 Esophageal obstruction: Secondary | ICD-10-CM | POA: Diagnosis not present

## 2022-01-04 DIAGNOSIS — K219 Gastro-esophageal reflux disease without esophagitis: Secondary | ICD-10-CM | POA: Diagnosis not present

## 2022-01-04 DIAGNOSIS — K449 Diaphragmatic hernia without obstruction or gangrene: Secondary | ICD-10-CM | POA: Diagnosis not present

## 2022-01-12 DIAGNOSIS — L439 Lichen planus, unspecified: Secondary | ICD-10-CM | POA: Diagnosis not present

## 2022-01-12 DIAGNOSIS — D485 Neoplasm of uncertain behavior of skin: Secondary | ICD-10-CM | POA: Diagnosis not present

## 2022-01-12 DIAGNOSIS — D2271 Melanocytic nevi of right lower limb, including hip: Secondary | ICD-10-CM | POA: Diagnosis not present

## 2022-01-12 DIAGNOSIS — D0462 Carcinoma in situ of skin of left upper limb, including shoulder: Secondary | ICD-10-CM | POA: Diagnosis not present

## 2022-01-12 DIAGNOSIS — D2261 Melanocytic nevi of right upper limb, including shoulder: Secondary | ICD-10-CM | POA: Diagnosis not present

## 2022-01-12 DIAGNOSIS — L858 Other specified epidermal thickening: Secondary | ICD-10-CM | POA: Diagnosis not present

## 2022-01-12 DIAGNOSIS — L565 Disseminated superficial actinic porokeratosis (DSAP): Secondary | ICD-10-CM | POA: Diagnosis not present

## 2022-01-12 DIAGNOSIS — D2262 Melanocytic nevi of left upper limb, including shoulder: Secondary | ICD-10-CM | POA: Diagnosis not present

## 2022-01-12 DIAGNOSIS — D2272 Melanocytic nevi of left lower limb, including hip: Secondary | ICD-10-CM | POA: Diagnosis not present

## 2022-01-29 DIAGNOSIS — L905 Scar conditions and fibrosis of skin: Secondary | ICD-10-CM | POA: Diagnosis not present

## 2022-01-29 DIAGNOSIS — D0462 Carcinoma in situ of skin of left upper limb, including shoulder: Secondary | ICD-10-CM | POA: Diagnosis not present

## 2022-01-30 DIAGNOSIS — H2513 Age-related nuclear cataract, bilateral: Secondary | ICD-10-CM | POA: Diagnosis not present

## 2022-04-05 DIAGNOSIS — J0100 Acute maxillary sinusitis, unspecified: Secondary | ICD-10-CM | POA: Diagnosis not present

## 2022-04-05 DIAGNOSIS — J209 Acute bronchitis, unspecified: Secondary | ICD-10-CM | POA: Diagnosis not present

## 2022-04-12 DIAGNOSIS — Z96649 Presence of unspecified artificial hip joint: Secondary | ICD-10-CM | POA: Diagnosis not present

## 2022-04-12 DIAGNOSIS — M7071 Other bursitis of hip, right hip: Secondary | ICD-10-CM | POA: Diagnosis not present

## 2022-04-12 DIAGNOSIS — Z96641 Presence of right artificial hip joint: Secondary | ICD-10-CM | POA: Diagnosis not present

## 2022-04-12 DIAGNOSIS — M5416 Radiculopathy, lumbar region: Secondary | ICD-10-CM | POA: Diagnosis not present

## 2022-04-20 ENCOUNTER — Other Ambulatory Visit: Payer: Self-pay | Admitting: Family Medicine

## 2022-04-20 DIAGNOSIS — Z1231 Encounter for screening mammogram for malignant neoplasm of breast: Secondary | ICD-10-CM

## 2022-06-01 ENCOUNTER — Ambulatory Visit
Admission: RE | Admit: 2022-06-01 | Discharge: 2022-06-01 | Disposition: A | Discharge status: Home / Self Care | Payer: PPO | Source: Ambulatory Visit | Attending: Family Medicine | Admitting: Family Medicine

## 2022-06-01 DIAGNOSIS — Z1231 Encounter for screening mammogram for malignant neoplasm of breast: Secondary | ICD-10-CM | POA: Diagnosis not present

## 2022-06-26 DIAGNOSIS — Z79899 Other long term (current) drug therapy: Secondary | ICD-10-CM | POA: Diagnosis not present

## 2022-06-26 DIAGNOSIS — R739 Hyperglycemia, unspecified: Secondary | ICD-10-CM | POA: Diagnosis not present

## 2022-06-26 DIAGNOSIS — E7800 Pure hypercholesterolemia, unspecified: Secondary | ICD-10-CM | POA: Diagnosis not present

## 2022-07-02 DIAGNOSIS — I10 Essential (primary) hypertension: Secondary | ICD-10-CM | POA: Diagnosis not present

## 2022-07-02 DIAGNOSIS — E7800 Pure hypercholesterolemia, unspecified: Secondary | ICD-10-CM | POA: Diagnosis not present

## 2022-07-02 DIAGNOSIS — Z79899 Other long term (current) drug therapy: Secondary | ICD-10-CM | POA: Diagnosis not present

## 2022-07-02 DIAGNOSIS — G8929 Other chronic pain: Secondary | ICD-10-CM | POA: Diagnosis not present

## 2022-07-02 DIAGNOSIS — F419 Anxiety disorder, unspecified: Secondary | ICD-10-CM | POA: Diagnosis not present

## 2022-07-02 DIAGNOSIS — R739 Hyperglycemia, unspecified: Secondary | ICD-10-CM | POA: Diagnosis not present

## 2022-07-02 DIAGNOSIS — M545 Low back pain, unspecified: Secondary | ICD-10-CM | POA: Diagnosis not present

## 2022-07-03 DIAGNOSIS — D2261 Melanocytic nevi of right upper limb, including shoulder: Secondary | ICD-10-CM | POA: Diagnosis not present

## 2022-07-03 DIAGNOSIS — L309 Dermatitis, unspecified: Secondary | ICD-10-CM | POA: Diagnosis not present

## 2022-07-03 DIAGNOSIS — D2272 Melanocytic nevi of left lower limb, including hip: Secondary | ICD-10-CM | POA: Diagnosis not present

## 2022-07-03 DIAGNOSIS — Z85828 Personal history of other malignant neoplasm of skin: Secondary | ICD-10-CM | POA: Diagnosis not present

## 2022-07-04 ENCOUNTER — Other Ambulatory Visit: Payer: Self-pay | Admitting: Family Medicine

## 2022-07-04 DIAGNOSIS — M5416 Radiculopathy, lumbar region: Secondary | ICD-10-CM | POA: Diagnosis not present

## 2022-07-04 DIAGNOSIS — M5136 Other intervertebral disc degeneration, lumbar region: Secondary | ICD-10-CM | POA: Diagnosis not present

## 2022-07-17 ENCOUNTER — Other Ambulatory Visit: Payer: PPO

## 2022-08-01 ENCOUNTER — Ambulatory Visit
Admission: RE | Admit: 2022-08-01 | Discharge: 2022-08-01 | Disposition: A | Discharge status: Home / Self Care | Payer: PPO | Source: Ambulatory Visit | Attending: Family Medicine | Admitting: Family Medicine

## 2022-08-01 DIAGNOSIS — M4316 Spondylolisthesis, lumbar region: Secondary | ICD-10-CM | POA: Diagnosis not present

## 2022-08-01 DIAGNOSIS — M545 Low back pain, unspecified: Secondary | ICD-10-CM | POA: Diagnosis not present

## 2022-08-01 DIAGNOSIS — M48061 Spinal stenosis, lumbar region without neurogenic claudication: Secondary | ICD-10-CM | POA: Diagnosis not present

## 2022-08-01 DIAGNOSIS — M5416 Radiculopathy, lumbar region: Secondary | ICD-10-CM

## 2022-08-07 DIAGNOSIS — M5416 Radiculopathy, lumbar region: Secondary | ICD-10-CM | POA: Diagnosis not present

## 2022-08-07 DIAGNOSIS — M5136 Other intervertebral disc degeneration, lumbar region: Secondary | ICD-10-CM | POA: Diagnosis not present

## 2022-08-29 DIAGNOSIS — M5416 Radiculopathy, lumbar region: Secondary | ICD-10-CM | POA: Diagnosis not present

## 2022-08-29 DIAGNOSIS — M48062 Spinal stenosis, lumbar region with neurogenic claudication: Secondary | ICD-10-CM | POA: Diagnosis not present

## 2022-09-17 DIAGNOSIS — M5416 Radiculopathy, lumbar region: Secondary | ICD-10-CM | POA: Diagnosis not present

## 2022-09-17 DIAGNOSIS — M5136 Other intervertebral disc degeneration, lumbar region: Secondary | ICD-10-CM | POA: Diagnosis not present

## 2022-09-17 DIAGNOSIS — M48062 Spinal stenosis, lumbar region with neurogenic claudication: Secondary | ICD-10-CM | POA: Diagnosis not present

## 2022-10-08 DIAGNOSIS — J0100 Acute maxillary sinusitis, unspecified: Secondary | ICD-10-CM | POA: Diagnosis not present

## 2022-10-16 DIAGNOSIS — M48062 Spinal stenosis, lumbar region with neurogenic claudication: Secondary | ICD-10-CM | POA: Diagnosis not present

## 2022-10-16 DIAGNOSIS — M5416 Radiculopathy, lumbar region: Secondary | ICD-10-CM | POA: Diagnosis not present

## 2022-10-25 DIAGNOSIS — H6501 Acute serous otitis media, right ear: Secondary | ICD-10-CM | POA: Diagnosis not present

## 2022-11-09 DIAGNOSIS — M5416 Radiculopathy, lumbar region: Secondary | ICD-10-CM | POA: Diagnosis not present

## 2022-11-09 DIAGNOSIS — M48062 Spinal stenosis, lumbar region with neurogenic claudication: Secondary | ICD-10-CM | POA: Diagnosis not present

## 2022-11-09 DIAGNOSIS — M5136 Other intervertebral disc degeneration, lumbar region: Secondary | ICD-10-CM | POA: Diagnosis not present

## 2022-12-27 DIAGNOSIS — Z79899 Other long term (current) drug therapy: Secondary | ICD-10-CM | POA: Diagnosis not present

## 2022-12-27 DIAGNOSIS — E7800 Pure hypercholesterolemia, unspecified: Secondary | ICD-10-CM | POA: Diagnosis not present

## 2022-12-27 DIAGNOSIS — R739 Hyperglycemia, unspecified: Secondary | ICD-10-CM | POA: Diagnosis not present

## 2022-12-28 DIAGNOSIS — H2511 Age-related nuclear cataract, right eye: Secondary | ICD-10-CM | POA: Diagnosis not present

## 2022-12-28 DIAGNOSIS — D3132 Benign neoplasm of left choroid: Secondary | ICD-10-CM | POA: Diagnosis not present

## 2022-12-28 DIAGNOSIS — H2512 Age-related nuclear cataract, left eye: Secondary | ICD-10-CM | POA: Diagnosis not present

## 2023-01-03 DIAGNOSIS — Z79899 Other long term (current) drug therapy: Secondary | ICD-10-CM | POA: Diagnosis not present

## 2023-01-03 DIAGNOSIS — Z0000 Encounter for general adult medical examination without abnormal findings: Secondary | ICD-10-CM | POA: Diagnosis not present

## 2023-01-03 DIAGNOSIS — Z1331 Encounter for screening for depression: Secondary | ICD-10-CM | POA: Diagnosis not present

## 2023-01-07 ENCOUNTER — Encounter: Payer: Self-pay | Admitting: Ophthalmology

## 2023-01-07 DIAGNOSIS — H2512 Age-related nuclear cataract, left eye: Secondary | ICD-10-CM | POA: Diagnosis not present

## 2023-01-07 DIAGNOSIS — H2511 Age-related nuclear cataract, right eye: Secondary | ICD-10-CM | POA: Diagnosis not present

## 2023-01-07 DIAGNOSIS — D3132 Benign neoplasm of left choroid: Secondary | ICD-10-CM | POA: Diagnosis not present

## 2023-01-10 NOTE — Discharge Instructions (Signed)

## 2023-01-14 ENCOUNTER — Encounter
Admission: RE | Disposition: A | Discharge status: Home / Self Care | Payer: Self-pay | Source: Ambulatory Visit | Attending: Ophthalmology

## 2023-01-14 ENCOUNTER — Ambulatory Visit: Payer: PPO | Admitting: Anesthesiology

## 2023-01-14 ENCOUNTER — Other Ambulatory Visit: Payer: Self-pay

## 2023-01-14 ENCOUNTER — Encounter: Payer: Self-pay | Admitting: Ophthalmology

## 2023-01-14 ENCOUNTER — Ambulatory Visit
Admission: RE | Admit: 2023-01-14 | Discharge: 2023-01-14 | Disposition: A | Discharge status: Home / Self Care | Payer: PPO | Source: Ambulatory Visit | Attending: Ophthalmology | Admitting: Ophthalmology

## 2023-01-14 DIAGNOSIS — I10 Essential (primary) hypertension: Secondary | ICD-10-CM | POA: Diagnosis not present

## 2023-01-14 DIAGNOSIS — E785 Hyperlipidemia, unspecified: Secondary | ICD-10-CM | POA: Diagnosis not present

## 2023-01-14 DIAGNOSIS — M1990 Unspecified osteoarthritis, unspecified site: Secondary | ICD-10-CM | POA: Diagnosis not present

## 2023-01-14 DIAGNOSIS — F419 Anxiety disorder, unspecified: Secondary | ICD-10-CM | POA: Diagnosis not present

## 2023-01-14 DIAGNOSIS — J309 Allergic rhinitis, unspecified: Secondary | ICD-10-CM | POA: Diagnosis not present

## 2023-01-14 DIAGNOSIS — H2511 Age-related nuclear cataract, right eye: Secondary | ICD-10-CM | POA: Diagnosis not present

## 2023-01-14 DIAGNOSIS — Z87891 Personal history of nicotine dependence: Secondary | ICD-10-CM | POA: Diagnosis not present

## 2023-01-14 HISTORY — DX: Polyneuropathy, unspecified: G62.9

## 2023-01-14 HISTORY — PX: CATARACT EXTRACTION W/PHACO: SHX586

## 2023-01-14 SURGERY — PHACOEMULSIFICATION, CATARACT, WITH IOL INSERTION
Anesthesia: Monitor Anesthesia Care | Site: Eye | Laterality: Right

## 2023-01-14 MED ORDER — FENTANYL CITRATE (PF) 100 MCG/2ML IJ SOLN
INTRAMUSCULAR | Status: DC | PRN
  Administered 2023-01-14 (×2): 50 ug via INTRAVENOUS

## 2023-01-14 MED ORDER — TETRACAINE HCL 0.5 % OP SOLN
1.0000 [drp] | OPHTHALMIC | Status: DC | PRN
  Administered 2023-01-14 (×3): 1 [drp] via OPHTHALMIC

## 2023-01-14 MED ORDER — SIGHTPATH DOSE#1 BSS IO SOLN
INTRAOCULAR | Status: DC | PRN
  Administered 2023-01-14: 15 mL

## 2023-01-14 MED ORDER — MIDAZOLAM HCL 2 MG/2ML IJ SOLN
INTRAMUSCULAR | Status: DC | PRN
  Administered 2023-01-14 (×2): 1 mg via INTRAVENOUS

## 2023-01-14 MED ORDER — LIDOCAINE HCL (PF) 2 % IJ SOLN
INTRAOCULAR | Status: DC | PRN
  Administered 2023-01-14: 1 mL via INTRAOCULAR

## 2023-01-14 MED ORDER — LACTATED RINGERS IV SOLN: INTRAVENOUS | Status: DC

## 2023-01-14 MED ORDER — ONDANSETRON HCL 4 MG/2ML IJ SOLN
INTRAMUSCULAR | Status: DC | PRN
  Administered 2023-01-14: 4 mg via INTRAVENOUS

## 2023-01-14 MED ORDER — SIGHTPATH DOSE#1 NA HYALUR & NA CHOND-NA HYALUR IO KIT
PACK | INTRAOCULAR | Status: DC | PRN
  Administered 2023-01-14: 1 via OPHTHALMIC

## 2023-01-14 MED ORDER — SIGHTPATH DOSE#1 BSS IO SOLN
INTRAOCULAR | Status: DC | PRN
  Administered 2023-01-14: 79 mL via OPHTHALMIC

## 2023-01-14 MED ORDER — BRIMONIDINE TARTRATE-TIMOLOL 0.2-0.5 % OP SOLN
OPHTHALMIC | Status: DC | PRN
  Administered 2023-01-14: 1 [drp] via OPHTHALMIC

## 2023-01-14 MED ORDER — MOXIFLOXACIN HCL 0.5 % OP SOLN
OPHTHALMIC | Status: DC | PRN
  Administered 2023-01-14: .2 mL via OPHTHALMIC

## 2023-01-14 MED ORDER — ARMC OPHTHALMIC DILATING DROPS
1.0000 | OPHTHALMIC | Status: DC | PRN
  Administered 2023-01-14 (×3): 1 via OPHTHALMIC

## 2023-01-14 SURGICAL SUPPLY — 12 items
CATARACT SUITE SIGHTPATH (MISCELLANEOUS) ×1 IMPLANT
DISSECTOR HYDRO NUCLEUS 50X22 (MISCELLANEOUS) ×1 IMPLANT
DRSG TEGADERM 2-3/8X2-3/4 SM (GAUZE/BANDAGES/DRESSINGS) ×1 IMPLANT
FEE CATARACT SUITE SIGHTPATH (MISCELLANEOUS) ×1 IMPLANT
GLOVE SURG SYN 7.5  E (GLOVE) ×1
GLOVE SURG SYN 7.5 E (GLOVE) ×1 IMPLANT
GLOVE SURG SYN 7.5 PF PI (GLOVE) ×1 IMPLANT
GLOVE SURG SYN 8.5  E (GLOVE) ×1
GLOVE SURG SYN 8.5 E (GLOVE) ×1 IMPLANT
GLOVE SURG SYN 8.5 PF PI (GLOVE) ×1 IMPLANT
LENS IOL TECNIS EYHANCE 19.0 (Intraocular Lens) IMPLANT
WATER STERILE IRR 250ML POUR (IV SOLUTION) ×1 IMPLANT

## 2023-01-14 NOTE — Op Note (Signed)
OPERATIVE NOTE  TAI CHIAO [id number] 01/14/2023   PREOPERATIVE DIAGNOSIS: Nuclear sclerotic cataract right eye. H25.11   POSTOPERATIVE DIAGNOSIS: Nuclear sclerotic cataract right eye. H25.11   PROCEDURE:  Phacoemusification with posterior chamber intraocular lens placement of the right eye  Ultrasound time: Procedure(s): CATARACT EXTRACTION PHACO AND INTRAOCULAR LENS PLACEMENT (IOC) RIGHT  20.00  01:36.9 (Right)  LENS:   Implant Name Type Inv. Item Serial No. Manufacturer Lot No. LRB No. Used Action  LENS IOL TECNIS EYHANCE 19.0 - [id number] Intraocular Lens LENS IOL TECNIS EYHANCE 19.0 [id number] [employer]  Right 1 Implanted      SURGEON:  Courtney Heys. Lazarus Salines, MD   ANESTHESIA:  Topical with tetracaine drops, augmented with 1% preservative-free intracameral lidocaine.   COMPLICATIONS:  None.   DESCRIPTION OF PROCEDURE:  The patient was identified in the holding room and transported to the operating room and placed in the supine position under the operating microscope.  The right eye was identified as the operative eye, which was prepped and draped in the usual sterile ophthalmic fashion.   A 1 millimeter clear-corneal paracentesis was made superotemporally. Preservative-free 1% lidocaine mixed with 1:1,000 bisulfite-free aqueous solution of epinephrine was injected into the anterior chamber. The anterior chamber was then filled with Viscoat viscoelastic. A 2.4 millimeter keratome was used to make a clear-corneal incision inferotemporally. A curvilinear capsulorrhexis was made with a cystotome and capsulorrhexis forceps. Balanced salt solution was used to hydrodissect and hydrodelineate the nucleus. Phacoemulsification was then used to remove the lens nucleus and epinucleus. The remaining cortex was then removed using the irrigation and aspiration handpiece. Provisc was then placed into the capsular bag to distend it for lens placement. A +19.00 D DIB00 intraocular lens was then injected  into the capsular bag. The remaining viscoelastic was aspirated.   Wounds were hydrated with balanced salt solution.  The anterior chamber was inflated to a physiologic pressure with balanced salt solution.  No wound leaks were noted. Vigamox was injected intracamerally.  Timolol and Brimonidine drops were applied to the eye.  The patient was taken to the recovery room in stable condition without complications of anesthesia or surgery.  Maryann Alar [place] 01/14/2023, 9:24 AM

## 2023-01-14 NOTE — Transfer of Care (Signed)
Immediate Anesthesia Transfer of Care Note  Patient: Brittney Ramsey  Procedure(s) Performed: CATARACT EXTRACTION PHACO AND INTRAOCULAR LENS PLACEMENT (IOC) RIGHT  20.00  01:36.9 (Right: Eye)  Patient Location: PACU  Anesthesia Type: MAC  Level of Consciousness: awake, alert  and patient cooperative  Airway and Oxygen Therapy: Patient Spontanous Breathing and Patient connected to supplemental oxygen  Post-op Assessment: Post-op Vital signs reviewed, Patient's Cardiovascular Status Stable, Respiratory Function Stable, Patent Airway and No signs of Nausea or vomiting  Post-op Vital Signs: Reviewed and stable  Complications: No notable events documented.

## 2023-01-14 NOTE — Anesthesia Preprocedure Evaluation (Signed)
Anesthesia Evaluation  Patient identified by MRN, date of birth, ID band Patient awake    Reviewed: Allergy & Precautions, H&P , NPO status , Patient's Chart, lab work & pertinent test results  History of Anesthesia Complications (+) PONV and history of anesthetic complications  Airway Mallampati: II  TM Distance: >3 FB Neck ROM: limited    Dental  (+) Chipped, Caps   Pulmonary neg COPD, former smoker          Cardiovascular hypertension, (-) angina (-) Past MI and (-) Cardiac Stents (-) dysrhythmias      Neuro/Psych  PSYCHIATRIC DISORDERS Anxiety     negative neurological ROS     GI/Hepatic negative GI ROS, Neg liver ROS,,,  Endo/Other  negative endocrine ROS    Renal/GU      Musculoskeletal   Abdominal   Peds  Hematology negative hematology ROS (+)   Anesthesia Other Findings Past Medical History: No date: Allergic rhinitis No date: Allergy No date: Anxiety No date: Arthritis No date: Complication of anesthesia No date: Hyperlipemia No date: Hypertension No date: PONV (postoperative nausea and vomiting)   Reproductive/Obstetrics negative OB ROS                             Anesthesia Physical Anesthesia Plan  ASA: 2  Anesthesia Plan: MAC   Post-op Pain Management:    Induction: Intravenous  PONV Risk Score and Plan: 3 and Midazolam and TIVA  Airway Management Planned: Nasal Cannula and Natural Airway  Additional Equipment:   Intra-op Plan:   Post-operative Plan:   Informed Consent: I have reviewed the patients History and Physical, chart, labs and discussed the procedure including the risks, benefits and alternatives for the proposed anesthesia with the patient or authorized representative who has indicated his/her understanding and acceptance.     Dental Advisory Given  Plan Discussed with: Anesthesiologist and CRNA  Anesthesia Plan Comments: (Patient  consented for risks of anesthesia including but not limited to:  - adverse reactions to medications - damage to eyes, teeth, lips or other oral mucosa - nerve damage due to positioning  - sore throat or hoarseness - Damage to heart, brain, nerves, lungs, other parts of body or loss of life  Patient voiced understanding.)        Anesthesia Quick Evaluation

## 2023-01-14 NOTE — Anesthesia Postprocedure Evaluation (Signed)
Anesthesia Post Note  Patient: Brittney Ramsey  Procedure(s) Performed: CATARACT EXTRACTION PHACO AND INTRAOCULAR LENS PLACEMENT (IOC) RIGHT  20.00  01:36.9 (Right: Eye)  Patient location during evaluation: PACU Anesthesia Type: MAC Level of consciousness: awake and alert Pain management: pain level controlled Vital Signs Assessment: post-procedure vital signs reviewed and stable Respiratory status: spontaneous breathing, nonlabored ventilation, respiratory function stable and patient connected to nasal cannula oxygen Cardiovascular status: stable and blood pressure returned to baseline Postop Assessment: no apparent nausea or vomiting Anesthetic complications: no  No notable events documented.   Last Vitals:  Vitals:   01/14/23 0925 01/14/23 0930  BP: 128/62 135/69  Pulse: 87 81  Resp: 16 12  Temp: 36.5 C 36.5 C  SpO2: 97% 98%    Last Pain:  Vitals:   01/14/23 0930  TempSrc:   PainSc: 0-No pain                 Dimas Millin

## 2023-01-14 NOTE — H&P (Signed)
[facility]   Primary Care Physician:  Derinda Late, MD Ophthalmologist: Dr. Merleen Nicely  Pre-Procedure History & Physical: HPI:  Brittney Ramsey is a 77 y.o. female here for cataract surgery.   Past Medical History:  Diagnosis Date   Allergic rhinitis    Allergy    Anxiety    Arthritis    Complication of anesthesia    Hyperlipemia    Hypertension    Neuropathy    legs   PONV (postoperative nausea and vomiting)     Past Surgical History:  Procedure Laterality Date   COLONOSCOPY     ESOPHAGEAL MANOMETRY N/A 12/21/2015   Procedure: ESOPHAGEAL MANOMETRY (EM);  Surgeon: Josefine Class, MD;  Location: [facility] ENDOSCOPY;  Service: Endoscopy;  Laterality: N/A;   ESOPHAGOGASTRODUODENOSCOPY (EGD) WITH PROPOFOL N/A 11/04/2015   Procedure: ESOPHAGOGASTRODUODENOSCOPY (EGD) WITH PROPOFOL;  Surgeon: Hulen Luster, MD;  Location: [facility] ENDOSCOPY;  Service: Endoscopy;  Laterality: N/A;   HIP ARTHROPLASTY Right 08/03/2019   Procedure: ARTHROPLASTY BIPOLAR HIP (HEMIARTHROPLASTY);  Surgeon: Dereck Leep, MD;  Location: [facility] ORS;  Service: Orthopedics;  Laterality: Right;   L3-4, L4-5 decompression and fusion  10/20/2018   Dr. Earle Gell   TUBAL LIGATION      Prior to Admission medications   Medication Sig Start Date End Date Taking? Authorizing Provider  albuterol (VENTOLIN HFA) 108 (90 Base) MCG/ACT inhaler Inhale into the lungs every 6 (six) hours as needed for wheezing or shortness of breath.   Yes [provider]  busPIRone (BUSPAR) 10 MG tablet Take 10 mg by mouth daily.   Yes [provider]  Cholecalciferol (VITAMIN D-3) 125 MCG (5000 UT) TABS Take by mouth daily.   Yes [provider]  Coenzyme Q10 (COQ10 PO) Take by mouth daily.   Yes [provider]  Cyanocobalamin (VITAMIN B-12 PO) Take by mouth daily.   Yes [provider]  diclofenac Sodium (VOLTAREN) 1 % GEL Apply topically as needed.   Yes [provider]   docusate sodium (COLACE) 100 MG capsule Take 1 capsule (100 mg total) by mouth 2 (two) times daily. 10/22/18  Yes Newman Pies, MD  DULoxetine (CYMBALTA) 60 MG capsule Take 60 mg by mouth 2 (two) times daily.   Yes [provider]  fluticasone (FLONASE) 50 MCG/ACT nasal spray Place 2 sprays into both nostrils daily as needed for allergies.    Yes [provider]  gabapentin (NEURONTIN) 100 MG capsule Take 100 mg by mouth 3 (three) times daily as needed.   Yes [provider]  Lidocaine 4 % PTCH Apply 1 patch topically daily as needed (pain).   Yes [provider]  LORazepam (ATIVAN) 0.5 MG tablet Take 0.5 mg by mouth at bedtime.    Yes [provider]  losartan-hydrochlorothiazide (HYZAAR) 100-25 MG tablet Take 1 tablet by mouth daily.   Yes [provider]  meloxicam (MOBIC) 7.5 MG tablet Take 7.5 mg by mouth 2 (two) times daily.   Yes [provider]  Misc Natural Products (OSTEO BI-FLEX ADV TRIPLE ST) TABS Take 1 tablet by mouth daily.   Yes [provider]  montelukast (SINGULAIR) 10 MG tablet Take 10 mg by mouth daily.   Yes [provider]  Omega-3 Fatty Acids (FISH OIL PO) Take by mouth daily.   Yes [provider]  pantoprazole (PROTONIX) 40 MG tablet Take 1 tablet (40 mg total) by mouth daily. 11/04/15  Yes Henreitta Leber, MD  polyethylene glycol (  MIRALAX / GLYCOLAX) packet Take 17 g by mouth daily as needed.   Yes [provider]  Probiotic CAPS Take 1 capsule by mouth daily.   Yes [provider]  simvastatin (ZOCOR) 20 MG tablet Take 20 mg by mouth daily at 6 PM.   Yes [provider]  traMADol (ULTRAM) 50 MG tablet Take 50 mg by mouth See admin instructions. Take 50 mg daily, may take a second 50 mg dose as needed for pain   Yes [provider]  acetaminophen (TYLENOL) 500 MG tablet Take 1,000 mg by mouth daily as needed for moderate pain or headache.     [provider]  aspirin EC 81 MG tablet Take 81 mg by mouth daily.    [provider]  Carboxymethylcellul-Glycerin (LUBRICATING EYE DROPS OP) Place 1 drop into both eyes daily as needed (dry eyes).    [provider]    Allergies as of 01/07/2023 - Review Complete 01/07/2023  Allergen Reaction Noted   Flagyl [metronidazole] Nausea And Vomiting 10/13/2018   Biaxin [clarithromycin] Other (See Comments) 11/02/2015   Erythromycin Other (See Comments) 11/02/2015    Family History  Problem Relation Age of Onset   Breast cancer Neg Hx     Social History   Socioeconomic History   Marital status: Widowed    Spouse name: Not on file   Number of children: Not on file   Years of education: Not on file   Highest education level: Not on file  Occupational History   Not on file  Tobacco Use   Smoking status: Former    Types: Cigarettes   Smokeless tobacco: Never   Tobacco comments:    Smoked for about 5 years in her 46s  Vaping Use   Vaping Use: Never used  Substance and Sexual Activity   Alcohol use: No   Drug use: Never   Sexual activity: Not on file  Other Topics Concern   Not on file  Social History Narrative   Not on file   Social Determinants of Health   Financial Resource Strain: Not on file  Food Insecurity: Not on file  Transportation Needs: Not on file  Physical Activity: Not on file  Stress: Not on file  Social Connections: Not on file  Intimate Partner Violence: Not on file    Review of Systems: See HPI, otherwise negative ROS  Physical Exam: Ht '5\' 3"'$  (1.6 m)   Wt 67.6 kg   BMI 26.39 kg/m  General:   Alert, cooperative in NAD Head:  Normocephalic and atraumatic. Respiratory:  Normal work of breathing. Cardiovascular:  RRR  Impression/Plan: Brittney Ramsey is here for cataract surgery.  Risks, benefits, limitations, and alternatives regarding cataract surgery have been reviewed with the patient.  Questions have been  answered.  All parties agreeable.   Norvel Richards, MD  01/14/2023, 7:05 AM

## 2023-01-15 ENCOUNTER — Encounter: Payer: Self-pay | Admitting: Ophthalmology

## 2023-01-15 DIAGNOSIS — H2512 Age-related nuclear cataract, left eye: Secondary | ICD-10-CM | POA: Diagnosis not present

## 2023-01-22 NOTE — Discharge Instructions (Signed)

## 2023-01-24 ENCOUNTER — Ambulatory Visit
Admission: RE | Admit: 2023-01-24 | Discharge: 2023-01-24 | Disposition: A | Discharge status: Home / Self Care | Payer: PPO | Source: Ambulatory Visit | Attending: Ophthalmology | Admitting: Ophthalmology

## 2023-01-24 ENCOUNTER — Ambulatory Visit: Payer: PPO | Admitting: Anesthesiology

## 2023-01-24 ENCOUNTER — Encounter
Admission: RE | Disposition: A | Discharge status: Home / Self Care | Payer: Self-pay | Source: Ambulatory Visit | Attending: Ophthalmology

## 2023-01-24 ENCOUNTER — Other Ambulatory Visit: Payer: Self-pay

## 2023-01-24 DIAGNOSIS — F419 Anxiety disorder, unspecified: Secondary | ICD-10-CM | POA: Insufficient documentation

## 2023-01-24 DIAGNOSIS — M1990 Unspecified osteoarthritis, unspecified site: Secondary | ICD-10-CM | POA: Diagnosis not present

## 2023-01-24 DIAGNOSIS — I10 Essential (primary) hypertension: Secondary | ICD-10-CM | POA: Insufficient documentation

## 2023-01-24 DIAGNOSIS — Z87891 Personal history of nicotine dependence: Secondary | ICD-10-CM | POA: Diagnosis not present

## 2023-01-24 DIAGNOSIS — H269 Unspecified cataract: Secondary | ICD-10-CM | POA: Diagnosis not present

## 2023-01-24 DIAGNOSIS — H2512 Age-related nuclear cataract, left eye: Secondary | ICD-10-CM | POA: Insufficient documentation

## 2023-01-24 HISTORY — PX: CATARACT EXTRACTION W/PHACO: SHX586

## 2023-01-24 SURGERY — PHACOEMULSIFICATION, CATARACT, WITH IOL INSERTION
Anesthesia: Monitor Anesthesia Care | Site: Eye | Laterality: Left

## 2023-01-24 MED ORDER — ARMC OPHTHALMIC DILATING DROPS
1.0000 | OPHTHALMIC | Status: DC | PRN
  Administered 2023-01-24 (×3): 1 via OPHTHALMIC

## 2023-01-24 MED ORDER — FENTANYL CITRATE (PF) 100 MCG/2ML IJ SOLN
INTRAMUSCULAR | Status: DC | PRN
  Administered 2023-01-24: 50 ug via INTRAVENOUS

## 2023-01-24 MED ORDER — SIGHTPATH DOSE#1 NA HYALUR & NA CHOND-NA HYALUR IO KIT
PACK | INTRAOCULAR | Status: DC | PRN
  Administered 2023-01-24: 1 via OPHTHALMIC

## 2023-01-24 MED ORDER — TETRACAINE HCL 0.5 % OP SOLN
1.0000 [drp] | OPHTHALMIC | Status: DC | PRN
  Administered 2023-01-24 (×3): 1 [drp] via OPHTHALMIC

## 2023-01-24 MED ORDER — SIGHTPATH DOSE#1 BSS IO SOLN
INTRAOCULAR | Status: DC | PRN
  Administered 2023-01-24: 56 mL via OPHTHALMIC

## 2023-01-24 MED ORDER — BRIMONIDINE TARTRATE-TIMOLOL 0.2-0.5 % OP SOLN
OPHTHALMIC | Status: DC | PRN
  Administered 2023-01-24: 1 [drp] via OPHTHALMIC

## 2023-01-24 MED ORDER — MOXIFLOXACIN HCL 0.5 % OP SOLN
OPHTHALMIC | Status: DC | PRN
  Administered 2023-01-24: .2 mL via OPHTHALMIC

## 2023-01-24 MED ORDER — MIDAZOLAM HCL 2 MG/2ML IJ SOLN
INTRAMUSCULAR | Status: DC | PRN
  Administered 2023-01-24 (×2): 1 mg via INTRAVENOUS

## 2023-01-24 MED ORDER — LIDOCAINE HCL (PF) 2 % IJ SOLN
INTRAOCULAR | Status: DC | PRN
  Administered 2023-01-24: 1 mL via INTRAOCULAR

## 2023-01-24 MED ORDER — SIGHTPATH DOSE#1 BSS IO SOLN
INTRAOCULAR | Status: DC | PRN
  Administered 2023-01-24: 15 mL

## 2023-01-24 SURGICAL SUPPLY — 12 items
CATARACT SUITE SIGHTPATH (MISCELLANEOUS) ×1 IMPLANT
DISSECTOR HYDRO NUCLEUS 50X22 (MISCELLANEOUS) ×1 IMPLANT
DRSG TEGADERM 2-3/8X2-3/4 SM (GAUZE/BANDAGES/DRESSINGS) ×1 IMPLANT
FEE CATARACT SUITE SIGHTPATH (MISCELLANEOUS) ×1 IMPLANT
GLOVE SURG SYN 7.5  E (GLOVE) ×1
GLOVE SURG SYN 7.5 E (GLOVE) ×1 IMPLANT
GLOVE SURG SYN 7.5 PF PI (GLOVE) ×1 IMPLANT
GLOVE SURG SYN 8.5  E (GLOVE) ×1
GLOVE SURG SYN 8.5 E (GLOVE) ×1 IMPLANT
GLOVE SURG SYN 8.5 PF PI (GLOVE) ×1 IMPLANT
LENS IOL TECNIS EYHANCE 20.5 (Intraocular Lens) IMPLANT
WATER STERILE IRR 250ML POUR (IV SOLUTION) ×1 IMPLANT

## 2023-01-24 NOTE — Anesthesia Postprocedure Evaluation (Signed)
Anesthesia Post Note  Patient: Brittney Ramsey  Procedure(s) Performed: CATARACT EXTRACTION PHACO AND INTRAOCULAR LENS PLACEMENT (IOC) LEFT  10.51  00:49.3 (Left: Eye)  Patient location during evaluation: PACU Anesthesia Type: MAC Level of consciousness: awake and alert Pain management: pain level controlled Vital Signs Assessment: post-procedure vital signs reviewed and stable Respiratory status: spontaneous breathing, nonlabored ventilation, respiratory function stable and patient connected to nasal cannula oxygen Cardiovascular status: stable and blood pressure returned to baseline Postop Assessment: no apparent nausea or vomiting Anesthetic complications: no   No notable events documented.   Last Vitals:  Vitals:   01/24/23 1306 01/24/23 1311  BP: (!) 146/71 (!) 148/70  Pulse: 82 73  Resp: 16 17  Temp: (!) 36.1 C (!) 36.1 C  SpO2: 99% 98%    Last Pain:  Vitals:   01/24/23 1311  TempSrc:   PainSc: 0-No pain                 Ilene Qua

## 2023-01-24 NOTE — Transfer of Care (Signed)
Immediate Anesthesia Transfer of Care Note  Patient: Brittney Ramsey  Procedure(s) Performed: CATARACT EXTRACTION PHACO AND INTRAOCULAR LENS PLACEMENT (IOC) LEFT  10.51  00:49.3 (Left: Eye)  Patient Location: PACU  Anesthesia Type: MAC  Level of Consciousness: awake, alert  and patient cooperative  Airway and Oxygen Therapy: Patient Spontanous Breathing and Patient connected to supplemental oxygen  Post-op Assessment: Post-op Vital signs reviewed, Patient's Cardiovascular Status Stable, Respiratory Function Stable, Patent Airway and No signs of Nausea or vomiting  Post-op Vital Signs: Reviewed and stable  Complications: No notable events documented.

## 2023-01-24 NOTE — Anesthesia Preprocedure Evaluation (Signed)
Anesthesia Evaluation  Patient identified by MRN, date of birth, ID band Patient awake    Reviewed: Allergy & Precautions, H&P , NPO status , Patient's Chart, lab work & pertinent test results  History of Anesthesia Complications (+) PONV and history of anesthetic complications  Airway Mallampati: II  TM Distance: >3 FB Neck ROM: limited    Dental  (+) Chipped, Caps   Pulmonary neg COPD, former smoker          Cardiovascular hypertension, (-) angina (-) Past MI and (-) Cardiac Stents (-) dysrhythmias      Neuro/Psych  PSYCHIATRIC DISORDERS Anxiety     negative neurological ROS     GI/Hepatic negative GI ROS, Neg liver ROS,,,  Endo/Other  negative endocrine ROS    Renal/GU      Musculoskeletal   Abdominal   Peds  Hematology negative hematology ROS (+)   Anesthesia Other Findings Past Medical History: No date: Allergic rhinitis No date: Allergy No date: Anxiety No date: Arthritis No date: Complication of anesthesia No date: Hyperlipemia No date: Hypertension No date: PONV (postoperative nausea and vomiting)   Reproductive/Obstetrics negative OB ROS                             Anesthesia Physical Anesthesia Plan  ASA: 2  Anesthesia Plan: MAC   Post-op Pain Management:    Induction: Intravenous  PONV Risk Score and Plan: 3 and Midazolam and TIVA  Airway Management Planned: Nasal Cannula and Natural Airway  Additional Equipment:   Intra-op Plan:   Post-operative Plan:   Informed Consent: I have reviewed the patients History and Physical, chart, labs and discussed the procedure including the risks, benefits and alternatives for the proposed anesthesia with the patient or authorized representative who has indicated his/her understanding and acceptance.     Dental Advisory Given  Plan Discussed with: Anesthesiologist and CRNA  Anesthesia Plan Comments: (Patient  consented for risks of anesthesia including but not limited to:  - adverse reactions to medications - damage to eyes, teeth, lips or other oral mucosa - nerve damage due to positioning  - sore throat or hoarseness - Damage to heart, brain, nerves, lungs, other parts of body or loss of life  Patient voiced understanding.)        Anesthesia Quick Evaluation  

## 2023-01-24 NOTE — Anesthesia Procedure Notes (Signed)
Procedure Name: MAC Date/Time: 01/24/2023 12:43 PM  Performed by: Hilbert Odor, CRNAPre-anesthesia Checklist: Patient identified, Emergency Drugs available, Suction available, Patient being monitored and Timeout performed Patient Re-evaluated:Patient Re-evaluated prior to induction Oxygen Delivery Method: Nasal cannula Preoxygenation: Pre-oxygenation with 100% oxygen Induction Type: IV induction

## 2023-01-24 NOTE — Op Note (Signed)
OPERATIVE NOTE  Brittney Ramsey [id number] 01/24/2023   PREOPERATIVE DIAGNOSIS: Nuclear sclerotic cataract left eye. H25.12   POSTOPERATIVE DIAGNOSIS: Nuclear sclerotic cataract left eye. H25.12   PROCEDURE:  Phacoemusification with posterior chamber intraocular lens placement of the left eye  Ultrasound time: Procedure(s): CATARACT EXTRACTION PHACO AND INTRAOCULAR LENS PLACEMENT (IOC) LEFT  10.51  00:49.3 (Left)  LENS:   Implant Name Type Inv. Item Serial No. Manufacturer Lot No. LRB No. Used Action  LENS IOL TECNIS EYHANCE 20.5 - [id number] Intraocular Lens LENS IOL TECNIS EYHANCE 20.5 [id number] [employer]  Left 1 Implanted      SURGEON:  Courtney Heys. Lazarus Salines, MD   ANESTHESIA:  Topical with tetracaine drops, augmented with 1% preservative-free intracameral lidocaine.   COMPLICATIONS:  None.   DESCRIPTION OF PROCEDURE:  The patient was identified in the holding room and transported to the operating room and placed in the supine position under the operating microscope.  The left eye was identified as the operative eye, which was prepped and draped in the usual sterile ophthalmic fashion.   A 1 millimeter clear-corneal paracentesis was made inferotemporally. Preservative-free 1% lidocaine mixed with 1:1,000 bisulfite-free aqueous solution of epinephrine was injected into the anterior chamber. The anterior chamber was then filled with Viscoat viscoelastic. A 2.4 millimeter keratome was used to make a clear-corneal incision superotemporally. A curvilinear capsulorrhexis was made with a cystotome and capsulorrhexis forceps. Balanced salt solution was used to hydrodissect and hydrodelineate the nucleus. Phacoemulsification was then used to remove the lens nucleus and epinucleus. The remaining cortex was then removed using the irrigation and aspiration handpiece. Provisc was then placed into the capsular bag to distend it for lens placement. An +20.50 D [id number] intraocular lens was then injected into  the capsular bag. The remaining viscoelastic was aspirated.   Wounds were hydrated with balanced salt solution.  The anterior chamber was inflated to a physiologic pressure with balanced salt solution.  No wound leaks were noted. Vigamox was injected intracamerally.  Timolol and Brimonidine drops were applied to the eye.  The patient was taken to the recovery room in stable condition without complications of anesthesia or surgery.  Maryann [employer] 01/24/2023, 1:05 PM

## 2023-01-24 NOTE — H&P (Signed)
[facility]   Primary Care Physician:  Derinda Late, MD Ophthalmologist: Dr. Merleen Nicely  Pre-Procedure History & Physical: HPI:  Brittney Ramsey is a 77 y.o. female here for cataract surgery.   Past Medical History:  Diagnosis Date   Allergic rhinitis    Allergy    Anxiety    Arthritis    Complication of anesthesia    Hyperlipemia    Hypertension    Neuropathy    legs   PONV (postoperative nausea and vomiting)     Past Surgical History:  Procedure Laterality Date   CATARACT EXTRACTION W/PHACO Right 01/14/2023   Procedure: CATARACT EXTRACTION PHACO AND INTRAOCULAR LENS PLACEMENT ([place]) RIGHT  20.00  01:36.9;  Surgeon: Norvel Richards, MD;  Location: [place];  Service: Ophthalmology;  Laterality: Right;   COLONOSCOPY     ESOPHAGEAL MANOMETRY N/A 12/21/2015   Procedure: ESOPHAGEAL MANOMETRY (EM);  Surgeon: Josefine Class, MD;  Location: [facility] ENDOSCOPY;  Service: Endoscopy;  Laterality: N/A;   ESOPHAGOGASTRODUODENOSCOPY (EGD) WITH PROPOFOL N/A 11/04/2015   Procedure: ESOPHAGOGASTRODUODENOSCOPY (EGD) WITH PROPOFOL;  Surgeon: Hulen Luster, MD;  Location: [facility] ENDOSCOPY;  Service: Endoscopy;  Laterality: N/A;   HIP ARTHROPLASTY Right 08/03/2019   Procedure: ARTHROPLASTY BIPOLAR HIP (HEMIARTHROPLASTY);  Surgeon: Dereck Leep, MD;  Location: [facility] ORS;  Service: Orthopedics;  Laterality: Right;   L3-4, L4-5 decompression and fusion  10/20/2018   Dr. Earle Gell   TUBAL LIGATION      Prior to Admission medications   Medication Sig Start Date End Date Taking? Authorizing Provider  acetaminophen (TYLENOL) 500 MG tablet Take 1,000 mg by mouth daily as needed for moderate pain or headache.   Yes [provider]  albuterol (VENTOLIN HFA) 108 (90 Base) MCG/ACT inhaler Inhale into the lungs every 6 (six) hours as needed for wheezing or shortness of breath.   Yes [provider]  busPIRone (BUSPAR) 10 MG tablet Take 10 mg by mouth daily.    Yes [provider]  Carboxymethylcellul-Glycerin (LUBRICATING EYE DROPS OP) Place 1 drop into both eyes daily as needed (dry eyes).   Yes [provider]  Cholecalciferol (VITAMIN D-3) 125 MCG (5000 UT) TABS Take by mouth daily.   Yes [provider]  Coenzyme Q10 (COQ10 PO) Take by mouth daily.   Yes [provider]  Cyanocobalamin (VITAMIN B-12 PO) Take by mouth daily.   Yes [provider]  diclofenac Sodium (VOLTAREN) 1 % GEL Apply topically as needed.   Yes [provider]  docusate sodium (COLACE) 100 MG capsule Take 1 capsule (100 mg total) by mouth 2 (two) times daily. 10/22/18  Yes Newman Pies, MD  DULoxetine (CYMBALTA) 60 MG capsule Take 60 mg by mouth 2 (two) times daily.   Yes [provider]  fluticasone (FLONASE) 50 MCG/ACT nasal spray Place 2 sprays into both nostrils daily as needed for allergies.    Yes [provider]  gabapentin (NEURONTIN) 100 MG capsule Take 100 mg by mouth 3 (three) times daily as needed.   Yes [provider]  Lidocaine 4 % PTCH Apply 1 patch topically daily as needed (pain).   Yes [provider]  LORazepam (ATIVAN) 0.5 MG tablet Take 0.5 mg by mouth at bedtime.    Yes [provider]  losartan-hydrochlorothiazide (HYZAAR) 100-25 MG tablet Take 1 tablet by mouth daily.   Yes [provider]  meloxicam (MOBIC) 7.5 MG tablet Take 7.5 mg by mouth 2 (two) times daily.  Yes [provider]  Misc Natural Products (OSTEO BI-FLEX ADV TRIPLE ST) TABS Take 1 tablet by mouth daily.   Yes [provider]  montelukast (SINGULAIR) 10 MG tablet Take 10 mg by mouth daily.   Yes [provider]  Omega-3 Fatty Acids (FISH OIL PO) Take by mouth daily.   Yes [provider]  pantoprazole (PROTONIX) 40 MG tablet Take 1 tablet (40 mg total) by mouth daily. 11/04/15  Yes Sainani, Belia Heman, MD  polyethylene glycol (MIRALAX / GLYCOLAX)  packet Take 17 g by mouth daily as needed.   Yes [provider]  Probiotic CAPS Take 1 capsule by mouth daily.   Yes [provider]  simvastatin (ZOCOR) 20 MG tablet Take 20 mg by mouth daily at 6 PM.   Yes [provider]  traMADol (ULTRAM) 50 MG tablet Take 50 mg by mouth See admin instructions. Take 50 mg daily, may take a second 50 mg dose as needed for pain   Yes [provider]    Allergies as of 01/07/2023 - Review Complete 01/07/2023  Allergen Reaction Noted   Flagyl [metronidazole] Nausea And Vomiting 10/13/2018   Biaxin [clarithromycin] Other (See Comments) 11/02/2015   Erythromycin Other (See Comments) 11/02/2015    Family History  Problem Relation Age of Onset   Breast cancer Neg Hx     Social History   Socioeconomic History   Marital status: Widowed    Spouse name: Not on file   Number of children: Not on file   Years of education: Not on file   Highest education level: Not on file  Occupational History   Not on file  Tobacco Use   Smoking status: Former    Types: Cigarettes   Smokeless tobacco: Never   Tobacco comments:    Smoked for about 5 years in her 42s  Vaping Use   Vaping Use: Never used  Substance and Sexual Activity   Alcohol use: No   Drug use: Never   Sexual activity: Not on file  Other Topics Concern   Not on file  Social History Narrative   Not on file   Social Determinants of Health   Financial Resource Strain: Not on file  Food Insecurity: Not on file  Transportation Needs: Not on file  Physical Activity: Not on file  Stress: Not on file  Social Connections: Not on file  Intimate Partner Violence: Not on file    Review of Systems: See HPI, otherwise negative ROS  Physical Exam: BP (!) 168/73   Pulse 79   Temp 99 F (37.2 C) (Temporal)   Resp 16   Ht '5\' 3"'$  (1.6 m)   Wt 64.9 kg   SpO2 99%   BMI 25.33 kg/m  General:   Alert, cooperative in NAD Head:  Normocephalic and  atraumatic. Respiratory:  Normal work of breathing. Cardiovascular:  RRR  Impression/Plan: Brittney Ramsey is here for cataract surgery.  Risks, benefits, limitations, and alternatives regarding cataract surgery have been reviewed with the patient.  Questions have been answered.  All parties agreeable.   Norvel Richards, MD  01/24/2023, 11:54 AM

## 2023-01-25 ENCOUNTER — Encounter: Payer: Self-pay | Admitting: Ophthalmology

## 2023-02-01 DIAGNOSIS — H40042 Steroid responder, left eye: Secondary | ICD-10-CM | POA: Diagnosis not present

## 2023-03-05 ENCOUNTER — Telehealth: Payer: Self-pay | Admitting: Physical Therapy

## 2023-03-05 NOTE — Telephone Encounter (Signed)
Called pt to inquire about moving up in schedule because of increased availability. She is not able to come earlier, because of transportation issues.

## 2023-03-06 ENCOUNTER — Ambulatory Visit: Payer: PPO | Admitting: Physical Therapy

## 2023-03-07 ENCOUNTER — Encounter: Payer: PPO | Admitting: Physical Therapy

## 2023-03-11 ENCOUNTER — Other Ambulatory Visit: Payer: Self-pay

## 2023-03-11 ENCOUNTER — Ambulatory Visit: Payer: PPO | Admitting: Physical Therapy

## 2023-03-11 ENCOUNTER — Ambulatory Visit: Payer: PPO | Attending: Family Medicine | Admitting: Physical Therapy

## 2023-03-11 ENCOUNTER — Encounter: Payer: Self-pay | Admitting: Physical Therapy

## 2023-03-11 DIAGNOSIS — R262 Difficulty in walking, not elsewhere classified: Secondary | ICD-10-CM | POA: Diagnosis not present

## 2023-03-11 DIAGNOSIS — M5459 Other low back pain: Secondary | ICD-10-CM | POA: Diagnosis not present

## 2023-03-11 NOTE — Therapy (Signed)
OUTPATIENT PHYSICAL THERAPY THORACOLUMBAR EVALUATION   Patient Name: Brittney Ramsey MRN: [id number] DOB:[birth year], 77 y.o., female Today's Date: 03/11/2023  END OF SESSION:  PT End of Session - 03/11/23 1118     Visit Number 1    Number of Visits 20    Date for PT Re-Evaluation 05/20/23    Authorization Type HTA [id number]    Authorization - Visit Number 1    Authorization - Number of Visits 20    Progress Note Due on Visit 10    PT Start Time 1100    PT Stop Time 1145    PT Time Calculation (min) 45 min    Activity Tolerance Patient limited by pain    Behavior During Therapy [facility] for tasks assessed/performed             Past Medical History:  Diagnosis Date   Allergic rhinitis    Allergy    Anxiety    Arthritis    Complication of anesthesia    Hyperlipemia    Hypertension    Neuropathy    legs   PONV (postoperative nausea and vomiting)    Past Surgical History:  Procedure Laterality Date   CATARACT EXTRACTION W/PHACO Right 01/14/2023   Procedure: CATARACT EXTRACTION PHACO AND INTRAOCULAR LENS PLACEMENT (IOC) RIGHT  20.00  01:36.9;  Surgeon: Estanislado Pandy, MD;  Location: [facility];  Service: Ophthalmology;  Laterality: Right;   CATARACT EXTRACTION W/PHACO Left 01/24/2023   Procedure: CATARACT EXTRACTION PHACO AND INTRAOCULAR LENS PLACEMENT (IOC) LEFT  10.51  00:49.3;  Surgeon: Estanislado Pandy, MD;  Location: [facility];  Service: Ophthalmology;  Laterality: Left;   COLONOSCOPY     ESOPHAGEAL MANOMETRY N/A 12/21/2015   Procedure: ESOPHAGEAL MANOMETRY (EM);  Surgeon: Elnita Maxwell, MD;  Location: [facility];  Service: Endoscopy;  Laterality: N/A;   ESOPHAGOGASTRODUODENOSCOPY (EGD) WITH PROPOFOL N/A 11/04/2015   Procedure: ESOPHAGOGASTRODUODENOSCOPY (EGD) WITH PROPOFOL;  Surgeon: Wallace Cullens, MD;  Location: [facility]COPY;  Service: Endoscopy;  Laterality: N/A;   HIP ARTHROPLASTY Right 08/03/2019   Procedure: ARTHROPLASTY BIPOLAR HIP  (HEMIARTHROPLASTY);  Surgeon: Donato Heinz, MD;  Location: [facility];  Service: Orthopedics;  Laterality: Right;   L3-4, L4-5 decompression and fusion  10/20/2018   Dr. Delma Officer   TUBAL LIGATION     Patient Active Problem List   Diagnosis Date Noted   HTN (hypertension) 08/02/2019   HLD (hyperlipidemia) 08/02/2019   Hip fracture, right (HCC) 08/02/2019   Hip fracture (HCC) 08/02/2019   Spondylolisthesis of lumbar region 10/20/2018   Hyponatremia 11/02/2015    PCP: Dr. 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 However, it has been getting steadily worse and she has already received three steroid injections. She also has also gotten a partial right hip replacement shortly after the back surgery because she fell and shattered her right hip. She reports having instability especially when walking fast and going up and downstairs. She wants to be able walk longer distances to be able to retrieve mail from her mailbox.  PAIN:  Are you having pain? Yes: NPRS scale: 9/10 Pain location: Lumbar radiates from center out  Pain description: Achy  Aggravating factors: Bending forward and walking for long periods of time  Relieving  factors: Meds help some and icing back   PRECAUTIONS: None  WEIGHT BEARING RESTRICTIONS: No  FALLS:  Has patient fallen in last 6 months? No, but she feels that her balance is off. She feels more off balance when stepping down stairs or walking fast or walking up steep hill  LIVING ENVIRONMENT: Lives with: lives alone Lives in: House/apartment Stairs: No Has following equipment at home: Ramped entry  OCCUPATION: Retired   PLOF: Independent  PATIENT GOALS: Pt wants to be able to walk longer distances without feeling pain in her back.  NEXT MD VISIT: 6 months from now, 09/10/23  OBJECTIVE:   VITALS:bp 135/57 hr 81 SpO2 100  DIAGNOSTIC FINDINGS:  CLINICAL DATA:  Low back pain radiates down posterior right hip into leg   EXAM: MRI LUMBAR SPINE WITHOUT CONTRAST   TECHNIQUE: Multiplanar, multisequence MR imaging of the lumbar spine was performed. No intravenous contrast was administered.   COMPARISON:  08/10/2018 MRI lumbar spine, correlation is also made with 09/01/2019 lumbar spine radiographs   FINDINGS: Segmentation:  5 lumbar-type vertebral bodies.   Alignment: S shaped curvature of the thoracolumbar spine. Trace retrolisthesis of T12 on L1 and L1 on L2, which are new compared to prior. 5 mm retrolisthesis of L2 on L3, also new compared to prior. Decreased anterolisthesis of L3 on L4. No residual anterolisthesis is seen at L4-L5. 5 mm anterolisthesis L5 on S1 is new from the prior exam.   Vertebrae: Status post L3-L5 fusion and decompression. Susceptibility artifact from the hardware limits evaluation of the levels. Within this limitation, no acute fracture or suspicious osseous lesion.   Conus medullaris and cauda equina: Conus extends to the L2 level. Conus and cauda equina appear normal, although evaluation of the postoperative levels is limited by susceptibility artifact.   Paraspinal and other soft tissues: Atrophy of the inferior paraspinous  muscles.   Disc levels:   T11-T12: Seen only on the sagittal images. Minimal disc bulge. No spinal canal stenosis or neural foraminal narrowing.   T12-L1: Trace retrolisthesis and minimal disc bulge. Mild facet arthropathy. No spinal canal stenosis or neural foraminal narrowing.   L1-L2: Trace retrolisthesis and minimal disc bulge. Mild facet arthropathy. No spinal canal stenosis or neural foraminal narrowing.   L2-L3: 5 mm retrolisthesis. Moderate facet arthropathy. Ligamentum flavum hypertrophy. Moderate spinal canal stenosis. Effacement of the lateral recesses, likely compressing the descending L3 nerve roots. Mild left neural foraminal narrowing, although evaluation of the neural foramina is somewhat limited by susceptibility artifact from adjacent hardware.   L3-L4: Status post fusion and decompression. Grade 1 anterolisthesis with disc unroofing. No spinal canal stenosis or neural foraminal narrowing, although evaluation of the neural foramina is somewhat limited by susceptibility artifact.  L4-L5: Status post fusion and decompression. No spinal canal stenosis or definite neural foraminal narrowing, although evaluation of the left neural foramen is limited by susceptibility artifact.   L5-S1: 5 mm anterolisthesis with disc unroofing. Mild facet arthropathy. No spinal canal stenosis or neural foraminal narrowing, although evaluation of the neural foramina is somewhat limited by susceptibility artifact.   IMPRESSION: 1. Status post L3-L5 fusion and decompression, with no evidence of residual spinal canal stenosis or neural foraminal narrowing at these levels. 2. L2-L3 moderate spinal canal stenosis and mild left neural foraminal narrowing, which are new from the prior exam. Effacement of the lateral recesses at this level likely compresses the descending L3 nerve roots.     Electronically Signed   By: Wiliam Ke M.D.   On: 08/02/2022 19:12  PATIENT SURVEYS:   FOTO NT   SCREENING FOR RED FLAGS: Bowel or bladder incontinence: No Spinal tumors: No Cauda equina syndrome: No Compression fracture: No Abdominal aneurysm: No  COGNITION: Overall cognitive status: Within functional limits for tasks assessed     SENSATION: WFL  MUSCLE LENGTH: Hamstrings: Right 90 deg; Left 90 deg Thomas test: Right 0 deg; Left 0 deg  POSTURE: rounded shoulders  PALPATION: Lumbar, L1-L5 pain radiating outwards to paraspinals   LUMBAR ROM:   AROM eval  Flexion 75%  Extension 85%  Right lateral flexion 75%  Left lateral flexion 75%*  Right rotation 75%  Left rotation 75%   (Blank rows = not tested)  LOWER EXTREMITY ROM:       Active  Right 03/11/2023 Left 03/11/2023  Hip flexion 120 120  Hip extension 30 30  Hip abduction    Hip adduction    Hip internal rotation    Hip external rotation    Knee flexion 135 135  Knee extension 0 0  Ankle dorsiflexion 20 20  Ankle plantarflexion    Ankle inversion    Ankle eversion     (Blank rows = not tested)     LOWER EXTREMITY MMT:    MMT Right eval Left eval  Hip flexion 4- 4-  Hip extension NT NT  Hip abduction 3+ 3+  Hip adduction 3+ 3+  Hip internal rotation NT NT  Hip external rotation NT NT   Knee flexion 4  4  Knee extension 4+ 4+  Ankle dorsiflexion 4+ 4+  Ankle plantarflexion    Ankle inversion    Ankle eversion     (Blank rows = not tested)  LUMBAR SPECIAL TESTS:  Straight leg raise test: Positive, FABER test: Positive, and Thomas test: Positive  FUNCTIONAL TESTS:  5 times sit to stand: 15 sec  30 seconds chair stand test 9 reps   GAIT: Distance walked: 50 ft  Assistive device utilized: None Level of assistance: Complete Independence Comments: Antalgic gait on right side with decrease stand time on right and step length on  left   TODAY'S TREATMENT:  DATE:   03/11/23:  Sit to Stand from 18 inch mat height 1 x 10  -Pt reports slight increase in pain in bilateral knees. Lower Trunk Rotation 1 x 10 with 3 sec hold  Single Knee to Chest 1 x 10 with 3 sec hold    PATIENT EDUCATION:  Education details: form and technique for correct performance of exercise  Person educated: Patient Education method: Explanation, Demonstration, Verbal cues, and Handouts Education comprehension: verbalized understanding and returned demonstration  HOME EXERCISE PROGRAM: Access Code: [email or web address] URL: [email or web address] Date: 03/11/2023 Prepared by: Ellin Goodie  Exercises - Supine Lower Trunk Rotation  - 1 x daily - 3 sets - 10 reps - Supine Single Knee to Chest Stretch  - 1 x daily - 3 sets - 10 reps - Sit to Stand with Counter Support  - 3 x weekly - 3 sets - 10 reps  ASSESSMENT:  CLINICAL IMPRESSION: Patient is a 77 y.o. white female who was seen today for physical therapy evaluation and treatment for low back pain with neurogenic claudication. She shows moderate and disability and moderate and stable symptom status that makes her most appropriate for the movement control treatment group. She demonstrates deficits that include decreased hip strength and mobility along with difficulty walking and increased pain with walking. She shows preference for flexion based movement patterns likely due to radicular symptoms from nerve impingement. She also shows increased right hip pain with possible hip OA pathology with further testing needing to verify. She will benefit from skilled PT to address these aforementioned deficits to return to walking for prolonged periods of time to complete ADLs such as grocery shopping and retrieving her mail to remain independent.   OBJECTIVE IMPAIRMENTS: Abnormal gait, decreased balance, decreased endurance, difficulty walking, decreased strength, hypomobility, impaired flexibility, and pain.   ACTIVITY  LIMITATIONS: carrying, lifting, standing, and locomotion level  PARTICIPATION LIMITATIONS: cleaning, laundry, shopping, community activity, yard work, and church  PERSONAL FACTORS: Age, Past/current experiences, and Time since onset of injury/illness/exacerbation are also affecting patient's functional outcome.   REHAB POTENTIAL: Fair chronicity of condition and past failed treatents   CLINICAL DECISION MAKING: Stable/uncomplicated  EVALUATION COMPLEXITY: Low   GOALS: Goals reviewed with patient? No  SHORT TERM GOALS: Target date: 03/25/2023  Pt will be independent with HEP in order to improve strength and balance in order to decrease fall risk and improve function at home and work. Baseline: NT  Goal status: INITIAL   LONG TERM GOALS: Target date: 05/20/2023  Patient will have improved function and activity level as evidenced by an increase in FOTO score by 10 points or more.  Baseline: NT  Goal status: INITIAL  2.  Patient will perform five sit to stand repetitions in <=15 secs as evidence of LE strength that shows that this community dwelling older adult that is older is at a decreased risk of falling. (Buatois 2010)   Baseline:  15 sec  Goal status: MET  3.  Patient will demonstrate improved LE endurance with completion of >=10 reps as indication of decrease risk of falling for age and gender matched norms to remain independent and avoid serious injury from falling. Baseline: 9 reps  Goal status: INITIAL  4.  Pt will decrease 5TSTS by at least 3 seconds in order to demonstrate clinically significant improvement in LE strength Baseline: 15 sec   Goal status: INITIAL  5.  Pt will increase by at least 62m (181ft) in order to demonstrate clinically significant improvement in cardiopulmonary  endurance and community ambulation Baseline: NT  Goal status: INITIAL  6.  Patient will improve hip and abdominal strength MMT by 1/3 grade MMT (ie 4- to 4) as evidence of improved  spinal stability to decrease neurogenic claudication and improve walking distance without being limited by pain.   Baseline: Hip Flex R/L  Goal status: INITIAL  PLAN:  PT FREQUENCY: 1-2x/week  PT DURATION: 10 weeks  PLANNED INTERVENTIONS: Therapeutic exercises, Therapeutic activity, Neuromuscular re-education, Balance training, Gait training, Joint mobilization, Joint manipulation, Stair training, Aquatic Therapy, Dry Needling, Electrical stimulation, Spinal manipulation, Spinal mobilization, Cryotherapy, Moist heat, Manual therapy, and Re-evaluation.  PLAN FOR NEXT SESSION: FOTO, , MCTSIB, DGI, Abdominal Strength Testing   Ellin Goodie PT, DPT  03/11/2023, 2:37 PM

## 2023-03-14 ENCOUNTER — Ambulatory Visit: Payer: PPO | Attending: Family Medicine | Admitting: Physical Therapy

## 2023-03-14 DIAGNOSIS — R262 Difficulty in walking, not elsewhere classified: Secondary | ICD-10-CM | POA: Insufficient documentation

## 2023-03-14 DIAGNOSIS — M5459 Other low back pain: Secondary | ICD-10-CM | POA: Insufficient documentation

## 2023-03-14 NOTE — Therapy (Signed)
OUTPATIENT PHYSICAL THERAPY TREATMENT NOTE   Patient Name: Brittney Ramsey MRN: [id number] DOB:1946/03/01, 77 y.o., female Today's Date: 03/14/2023  PCP: Dr. Larwance Sachs  REFERRING PROVIDER: Burman Freestone FNP   END OF SESSION:   PT End of Session - 03/14/23 0940     Visit Number 2    Number of Visits 20    Date for PT Re-Evaluation 05/20/23    Authorization Type HTA [id number]    Authorization - Visit Number 2    Authorization - Number of Visits 20    Progress Note Due on Visit 10    PT Start Time 0935    PT Stop Time 1015    PT Time Calculation (min) 40 min    Activity Tolerance Patient limited by pain    Behavior During Therapy [facility] for tasks assessed/performed             Past Medical History:  Diagnosis Date   Allergic rhinitis    Allergy    Anxiety    Arthritis    Complication of anesthesia    Hyperlipemia    Hypertension    Neuropathy    legs   PONV (postoperative nausea and vomiting)    Past Surgical History:  Procedure Laterality Date   CATARACT EXTRACTION W/PHACO Right 01/14/2023   Procedure: CATARACT EXTRACTION PHACO AND INTRAOCULAR LENS PLACEMENT (IOC) RIGHT  20.00  01:36.9;  Surgeon: Estanislado Pandy, MD;  Location: [facility];  Service: Ophthalmology;  Laterality: Right;   CATARACT EXTRACTION W/PHACO Left 01/24/2023   Procedure: CATARACT EXTRACTION PHACO AND INTRAOCULAR LENS PLACEMENT (IOC) LEFT  10.51  00:49.3;  Surgeon: Estanislado Pandy, MD;  Location: [facility];  Service: Ophthalmology;  Laterality: Left;   COLONOSCOPY     ESOPHAGEAL MANOMETRY N/A 12/21/2015   Procedure: ESOPHAGEAL MANOMETRY (EM);  Surgeon: Elnita Maxwell, MD;  Location: [facility];  Service: Endoscopy;  Laterality: N/A;   ESOPHAGOGASTRODUODENOSCOPY (EGD) WITH PROPOFOL N/A 11/04/2015   Procedure: ESOPHAGOGASTRODUODENOSCOPY (EGD) WITH PROPOFOL;  Surgeon: Wallace Cullens, MD;  Location: [facility] END[facility]COPY;  Service: Endoscopy;  Laterality: N/A;   HIP ARTHROPLASTY  Right 08/03/2019   Procedure: ARTHROPLASTY BIPOLAR HIP (HEMIARTHROPLASTY);  Surgeon: Donato Heinz, MD;  Location: [facility] ORS;  Service: Orthopedics;  Laterality: Right;   L3-4, L4-5 decompression and fusion  10/20/2018   Dr. Delma Officer   TUBAL LIGATION     Patient Active Problem List   Diagnosis Date Noted   HTN (hypertension) 08/02/2019   HLD (hyperlipidemia) 08/02/2019   Hip fracture, right (HCC) 08/02/2019   Hip fracture (HCC) 08/02/2019   Spondylolisthesis of lumbar region 10/20/2018   Hyponatremia 11/02/2015    REFERRING DIAG: DDD Lumbar   THERAPY DIAG:  Other low back pain  Difficulty in walking, not elsewhere classified  Rationale for Evaluation and Treatment Rehabilitation  PERTINENT HISTORY: Pt reports that she had a prior lumbar fusion in 2019 and her lower back pain improved somewhat. However, it has been getting steadily worse and she has already received three steroid injections. She also has also gotten a partial right hip replacement shortly after the back surgery because she fell and shattered her right hip. She reports having instability especially when walking fast and going up and downstairs. She wants to be able walk longer distances to be able to retrieve mail from her mailbox.   PRECAUTIONS: Falls risk   SUBJECTIVE:  SUBJECTIVE STATEMENT:  Pt reports feeling more shortness of breath when walking and she spoke to her physician who ordered her an inhaler. She is getting short of breath going to mailbox and back which is new to her.    PAIN:  Are you having pain? No   OBJECTIVE: (objective measures completed at initial evaluation unless otherwise dated)  OBJECTIVE:    VITALS:bp 135/57 hr 81 SpO2 100   DIAGNOSTIC FINDINGS:  CLINICAL DATA:  Low back pain radiates down  posterior right hip into leg   EXAM: MRI LUMBAR SPINE WITHOUT CONTRAST   TECHNIQUE: Multiplanar, multisequence MR imaging of the lumbar spine was performed. No intravenous contrast was administered.   COMPARISON:  08/10/2018 MRI lumbar spine, correlation is also made with 09/01/2019 lumbar spine radiographs   FINDINGS: Segmentation:  5 lumbar-type vertebral bodies.   Alignment: S shaped curvature of the thoracolumbar spine. Trace retrolisthesis of T12 on L1 and L1 on L2, which are new compared to prior. 5 mm retrolisthesis of L2 on L3, also new compared to prior. Decreased anterolisthesis of L3 on L4. No residual anterolisthesis is seen at L4-L5. 5 mm anterolisthesis L5 on S1 is new from the prior exam.   Vertebrae: Status post L3-L5 fusion and decompression. Susceptibility artifact from the hardware limits evaluation of the levels. Within this limitation, no acute fracture or suspicious osseous lesion.   Conus medullaris and cauda equina: Conus extends to the L2 level. Conus and cauda equina appear normal, although evaluation of the postoperative levels is limited by susceptibility artifact.   Paraspinal and other soft tissues: Atrophy of the inferior paraspinous muscles.   Disc levels:   T11-T12: Seen only on the sagittal images. Minimal disc bulge. No spinal canal stenosis or neural foraminal narrowing.   T12-L1: Trace retrolisthesis and minimal disc bulge. Mild facet arthropathy. No spinal canal stenosis or neural foraminal narrowing.   L1-L2: Trace retrolisthesis and minimal disc bulge. Mild facet arthropathy. No spinal canal stenosis or neural foraminal narrowing.   L2-L3: 5 mm retrolisthesis. Moderate facet arthropathy. Ligamentum flavum hypertrophy. Moderate spinal canal stenosis. Effacement of the lateral recesses, likely compressing the descending L3 nerve roots. Mild left neural foraminal narrowing, although evaluation of the neural foramina is somewhat  limited by susceptibility artifact from adjacent hardware.   L3-L4: Status post fusion and decompression. Grade 1 anterolisthesis with disc unroofing. No spinal canal stenosis or neural foraminal narrowing, although evaluation of the neural foramina is somewhat limited by susceptibility artifact.   L4-L5: Status post fusion and decompression. No spinal canal stenosis or definite neural foraminal narrowing, although evaluation of the left neural foramen is limited by susceptibility artifact.   L5-S1: 5 mm anterolisthesis with disc unroofing. Mild facet arthropathy. No spinal canal stenosis or neural foraminal narrowing, although evaluation of the neural foramina is somewhat limited by susceptibility artifact.   IMPRESSION: 1. Status post L3-L5 fusion and decompression, with no evidence of residual spinal canal stenosis or neural foraminal narrowing at these levels. 2. L2-L3 moderate spinal canal stenosis and mild left neural foraminal narrowing, which are new from the prior exam. Effacement of the lateral recesses at this level likely compresses the descending L3 nerve roots.     Electronically Signed   By: Wiliam Ke M.D.   On: 08/02/2022 19:12   PATIENT SURVEYS:  FOTO NT    SCREENING FOR RED FLAGS: Bowel or bladder incontinence: No Spinal tumors: No Cauda equina syndrome: No Compression fracture: No Abdominal aneurysm: No   COGNITION: Overall cognitive  status: Within functional limits for tasks assessed                          SENSATION: WFL   MUSCLE LENGTH: Hamstrings: Right 90 deg; Left 90 deg Thomas test: Right 0 deg; Left 0 deg   POSTURE: rounded shoulders   PALPATION: Lumbar, L1-L5 pain radiating outwards to paraspinals    LUMBAR ROM:    AROM eval  Flexion 75%  Extension 85%  Right lateral flexion 75%  Left lateral flexion 75%*  Right rotation 75%  Left rotation 75%   (Blank rows = not tested)   LOWER EXTREMITY ROM:          Active   Right 03/11/2023 Left 03/11/2023  Hip flexion 120 120  Hip extension 30 30  Hip abduction      Hip adduction      Hip internal rotation      Hip external rotation      Knee flexion 135 135  Knee extension 0 0  Ankle dorsiflexion 20 20  Ankle plantarflexion      Ankle inversion      Ankle eversion       (Blank rows = not tested)        LOWER EXTREMITY MMT:     MMT Right eval Left eval  Hip flexion 4- 4-  Hip extension NT NT  Hip abduction 3+ 3+  Hip adduction 3+ 3+  Hip internal rotation NT NT  Hip external rotation NT NT   Knee flexion 4  4  Knee extension 4+ 4+  Ankle dorsiflexion 4+ 4+  Ankle plantarflexion      Ankle inversion      Ankle eversion       (Blank rows = not tested)   LUMBAR SPECIAL TESTS:  Straight leg raise test: Positive, FABER test: Positive, and Thomas test: Positive   FUNCTIONAL TESTS:  5 times sit to stand: 15 sec  30 seconds chair stand test 9 reps    GAIT: Distance walked: 50 ft  Assistive device utilized: None Level of assistance: Complete Independence Comments: Antalgic gait on right side with decrease stand time on right and step length on  left    TODAY'S TREATMENT:                                                                                                                              DATE:   03/14/23:  10 Meter Walk Test   Gait Speed:    1st trial 12.97 sec , 2nd trial 14.31 sec , Avg= 13.64   sec - Normal Gait speed Avg=  0.73 m/sec    -> 1 m/sec [employer] and cross street safely and normal WS <1 m/sec Need Intervention to reduce falls risk  0.12 m/sec improvement represents a statistically significant improvement in gait speed    - 0.4 - 0.79 m/sec - limited community ambulator -  associated with need for intervention to reduce falls; increased risk for LE limitation and death and hospitalization in 1 year; increased dependence for personal care; 2-year mortality and morbidity; increased functional  impairments, severe walking disability   DGI: 17/24 Mild Impairments- Head turns, gait and pivot turn, stairs, obstacles, gait level surface, change in gait speed <=19/24 predictive of falls in the elderly   2 minute walk test: 242 ft  -Pt reports increased pain in her right hip  Mean (SD) for retirement dwelling older adults; 150.4 (23.1) meters (Connelly & Bloxom, 2009, Older Adults) MDC (calculated) = 12.2 meters or 40 feet (90% confidence) Justin Mend & Maisie Fus, 2009, Older Adults)  Standing Marches with BUE support  1 x 10    03/11/23:  Sit to Stand from 18 inch mat height 1 x 10  -Pt reports slight increase in pain in bilateral knees. Lower Trunk Rotation 1 x 10 with 3 sec hold  Single Knee to Chest 1 x 10 with 3 sec hold     PATIENT EDUCATION:  Education details: form and technique for correct performance of exercise  Person educated: Patient Education method: Explanation, Demonstration, Verbal cues, and Handouts Education comprehension: verbalized understanding and returned demonstration   HOME EXERCISE PROGRAM: Access Code: [email or web address] URL: [email or web address] Date: 03/14/2023 Prepared by: Ellin Goodie  Exercises - Supine Lower Trunk Rotation  - 1 x daily - 3 sets - 10 reps - Supine Single Knee to Chest Stretch  - 1 x daily - 3 sets - 10 reps - Sit to Stand with Counter Support  - 3 x weekly - 3 sets - 10 reps - Standing March with Counter Support  - 3 x weekly - 3 sets - 10 reps - Figure-8 Walking Around [employer]  - 1 x daily - 10 reps - 5 ft  hold ASSESSMENT:   CLINICAL IMPRESSION:  Pt presents for first visit after initial eval for chronic low back pain. She shows deficits with dynamic balance such as imbalance with horizontal head turns and need for use of AD throughout tasks that place her at risk for falls. She also shows decreased gait speed that places her at an increased risk for falls and that is indicative of a limited community ambulator.  She does show ambulation distance that is indicative of a retired community dwelling older adult, but she would likely benefit from further work on increasing her endurance to be able to tolerate walking longer distances up and down hills. She will continue to benefit from skilled PT to address these aforementioned deficits to return to walking for prolonged periods of time to complete ADLs such as grocery shopping and retrieving her mail to remain independent.    OBJECTIVE IMPAIRMENTS: Abnormal gait, decreased balance, decreased endurance, difficulty walking, decreased strength, hypomobility, impaired flexibility, and pain.    ACTIVITY LIMITATIONS: carrying, lifting, standing, and locomotion level   PARTICIPATION LIMITATIONS: cleaning, laundry, shopping, community activity, yard work, and church   PERSONAL FACTORS: Age, Past/current experiences, and Time since onset of injury/illness/exacerbation are also affecting patient's functional outcome.    REHAB POTENTIAL: Fair chronicity of condition and past failed treatents    CLINICAL DECISION MAKING: Stable/uncomplicated   EVALUATION COMPLEXITY: Low     GOALS: Goals reviewed with patient? No   SHORT TERM GOALS: Target date: 03/25/2023   Pt will be independent with HEP in order to improve strength and balance in order to decrease fall risk and improve function at home and work. Baseline: NT  Goal status:  INITIAL     LONG TERM GOALS: Target date: 05/20/2023   Patient will have improved function and activity level as evidenced by an increase in FOTO score by 10 points or more.  Baseline: NT  Goal status: Ongoing    2.  Patient will perform five sit to stand repetitions in <=15 secs as evidence of LE strength that shows that this community dwelling older adult that is older is at a decreased risk of falling. (Buatois 2010)   Baseline:  15 sec  Goal status: MET   3.  Patient will demonstrate improved LE endurance with completion of >=10  reps as indication of decrease risk of falling for age and gender matched norms to remain independent and avoid serious injury from falling. Baseline: 9 reps  Goal status: Ongoing    4.  Pt will decrease 5TSTS by at least 3 seconds in order to demonstrate clinically significant improvement in LE strength Baseline: 15 sec   Goal status: Ongoing    5.  Pt will increase by at least 12.2 m (40 ft) in order to demonstrate clinically significant improvement in cardiopulmonary endurance and community ambulation Baseline: 242 ft   Goal status: Ongoing    6.  Patient will improve hip and abdominal strength MMT by 1/3 grade MMT (ie 4- to 4) as evidence of improved spinal stability to decrease neurogenic claudication and improve walking distance without being limited by pain.   Baseline: Hip Flex R/L 4-/4-, Hip Abd R/L 3+/3+, Hip Add R/L 3+/3+ Goal status: Ongoing   7. Patient will show decreased risk of falling as evidence by decrease in self-selected gait speed by 0.12 m/sec.  Baseline: 0.73 m/sec Goal status: Ongoing     PLAN:   PT FREQUENCY: 1-2x/week   PT DURATION: 10 weeks   PLANNED INTERVENTIONS: Therapeutic exercises, Therapeutic activity, Neuromuscular re-education, Balance training, Gait training, Joint mobilization, Joint manipulation, Stair training, Aquatic Therapy, Dry Needling, Electrical stimulation, Spinal manipulation, Spinal mobilization, Cryotherapy, Moist heat, Manual therapy, and Re-evaluation.   PLAN FOR NEXT SESSION: FOTO, Fast walking speed,  MCTSIB, Abdominal Strength Testing. Continue to progress standing strengthening and balance exercises.    Ellin Goodie PT, DPT  03/14/2023, 9:43 AM

## 2023-03-18 ENCOUNTER — Ambulatory Visit: Payer: PPO | Admitting: Physical Therapy

## 2023-03-18 DIAGNOSIS — M5459 Other low back pain: Secondary | ICD-10-CM | POA: Diagnosis not present

## 2023-03-18 DIAGNOSIS — R262 Difficulty in walking, not elsewhere classified: Secondary | ICD-10-CM

## 2023-03-18 NOTE — Therapy (Signed)
OUTPATIENT PHYSICAL THERAPY TREATMENT NOTE   Patient Name: Brittney Ramsey MRN: [id number] DOB:17-Dec-1945, 77 y.o., female Today's Date: 03/18/2023  PCP: Dr. Larwance Sachs  REFERRING PROVIDER: Burman Freestone FNP   END OF SESSION:   PT End of Session - 03/18/23 1035     Visit Number 3    Number of Visits 20    Date for PT Re-Evaluation 05/20/23    Authorization Type [id number]    Authorization - Visit Number 3    Authorization - Number of Visits 20    Progress Note Due on Visit 10    PT Start Time 1035    PT Stop Time 1115    PT Time Calculation (min) 40 min    Activity Tolerance Patient limited by pain    Behavior During Therapy [facility] for tasks assessed/performed             Past Medical History:  Diagnosis Date   Allergic rhinitis    Allergy    Anxiety    Arthritis    Complication of anesthesia    Hyperlipemia    Hypertension    Neuropathy    legs   PONV (postoperative nausea and vomiting)    Past Surgical History:  Procedure Laterality Date   CATARACT EXTRACTION W/PHACO Right 01/14/2023   Procedure: CATARACT EXTRACTION PHACO AND INTRAOCULAR LENS PLACEMENT (IOC) RIGHT  20.00  01:36.9;  Surgeon: Estanislado Pandy, MD;  Location: [facility];  Service: Ophthalmology;  Laterality: Right;   CATARACT EXTRACTION W/PHACO Left 01/24/2023   Procedure: CATARACT EXTRACTION PHACO AND INTRAOCULAR LENS PLACEMENT (IOC) LEFT  10.51  00:49.3;  Surgeon: Estanislado Pandy, MD;  Location: [facility];  Service: Ophthalmology;  Laterality: Left;   COLONOSCOPY     ESOPHAGEAL MANOMETRY N/A 12/21/2015   Procedure: ESOPHAGEAL MANOMETRY (EM);  Surgeon: Elnita Maxwell, MD;  Location: [facility] END[facility]COPY;  Service: Endoscopy;  Laterality: N/A;   ESOPHAGOGASTRODUODENOSCOPY (EGD) WITH PROPOFOL N/A 11/04/2015   Procedure: ESOPHAGOGASTRODUODENOSCOPY (EGD) WITH PROPOFOL;  Surgeon: Wallace Cullens, MD;  Location: [facility];  Service: Endoscopy;  Laterality: N/A;   HIP ARTHROPLASTY  Right 08/03/2019   Procedure: ARTHROPLASTY BIPOLAR HIP (HEMIARTHROPLASTY);  Surgeon: Donato Heinz, MD;  Location: [facility] ORS;  Service: Orthopedics;  Laterality: Right;   L3-4, L4-5 decompression and fusion  10/20/2018   Dr. Delma Officer   TUBAL LIGATION     Patient Active Problem List   Diagnosis Date Noted   HTN (hypertension) 08/02/2019   HLD (hyperlipidemia) 08/02/2019   Hip fracture, right (HCC) 08/02/2019   Hip fracture (HCC) 08/02/2019   Spondylolisthesis of lumbar region 10/20/2018   Hyponatremia 11/02/2015    REFERRING DIAG: DDD Lumbar   THERAPY DIAG:  Other low back pain  Difficulty in walking, not elsewhere classified  Rationale for Evaluation and Treatment Rehabilitation  PERTINENT HISTORY: Pt reports that she had a prior lumbar fusion in 2019 and her lower back pain improved somewhat. However, it has been getting steadily worse and she has already received three steroid injections. She also has also gotten a partial right hip replacement shortly after the back surgery because she fell and shattered her right hip. She reports having instability especially when walking fast and going up and downstairs. She wants to be able walk longer distances to be able to retrieve mail from her mailbox.   PRECAUTIONS: Falls risk   SUBJECTIVE:  SUBJECTIVE STATEMENT:  Pt reports feeling more shortness of breath when walking and she spoke to her physician who ordered her an inhaler. She is getting short of breath going to mailbox and back which is new to her.    PAIN:  Are you having pain? No   OBJECTIVE: (objective measures completed at initial evaluation unless otherwise dated)  OBJECTIVE:    VITALS:bp 135/57 hr 81 SpO2 100   DIAGNOSTIC FINDINGS:  CLINICAL DATA:  Low back pain radiates down  posterior right hip into leg   EXAM: MRI LUMBAR SPINE WITHOUT CONTRAST   TECHNIQUE: Multiplanar, multisequence MR imaging of the lumbar spine was performed. No intravenous contrast was administered.   COMPARISON:  08/10/2018 MRI lumbar spine, correlation is also made with 09/01/2019 lumbar spine radiographs   FINDINGS: Segmentation:  5 lumbar-type vertebral bodies.   Alignment: S shaped curvature of the thoracolumbar spine. Trace retrolisthesis of T12 on L1 and L1 on L2, which are new compared to prior. 5 mm retrolisthesis of L2 on L3, also new compared to prior. Decreased anterolisthesis of L3 on L4. No residual anterolisthesis is seen at L4-L5. 5 mm anterolisthesis L5 on S1 is new from the prior exam.   Vertebrae: Status post L3-L5 fusion and decompression. Susceptibility artifact from the hardware limits evaluation of the levels. Within this limitation, no acute fracture or suspicious osseous lesion.   Conus medullaris and cauda equina: Conus extends to the L2 level. Conus and cauda equina appear normal, although evaluation of the postoperative levels is limited by susceptibility artifact.   Paraspinal and other soft tissues: Atrophy of the inferior paraspinous muscles.   Disc levels:   T11-T12: Seen only on the sagittal images. Minimal disc bulge. No spinal canal stenosis or neural foraminal narrowing.   T12-L1: Trace retrolisthesis and minimal disc bulge. Mild facet arthropathy. No spinal canal stenosis or neural foraminal narrowing.   L1-L2: Trace retrolisthesis and minimal disc bulge. Mild facet arthropathy. No spinal canal stenosis or neural foraminal narrowing.   L2-L3: 5 mm retrolisthesis. Moderate facet arthropathy. Ligamentum flavum hypertrophy. Moderate spinal canal stenosis. Effacement of the lateral recesses, likely compressing the descending L3 nerve roots. Mild left neural foraminal narrowing, although evaluation of the neural foramina is somewhat  limited by susceptibility artifact from adjacent hardware.   L3-L4: Status post fusion and decompression. Grade 1 anterolisthesis with disc unroofing. No spinal canal stenosis or neural foraminal narrowing, although evaluation of the neural foramina is somewhat limited by susceptibility artifact.   L4-L5: Status post fusion and decompression. No spinal canal stenosis or definite neural foraminal narrowing, although evaluation of the left neural foramen is limited by susceptibility artifact.   L5-S1: 5 mm anterolisthesis with disc unroofing. Mild facet arthropathy. No spinal canal stenosis or neural foraminal narrowing, although evaluation of the neural foramina is somewhat limited by susceptibility artifact.   IMPRESSION: 1. Status post L3-L5 fusion and decompression, with no evidence of residual spinal canal stenosis or neural foraminal narrowing at these levels. 2. L2-L3 moderate spinal canal stenosis and mild left neural foraminal narrowing, which are new from the prior exam. Effacement of the lateral recesses at this level likely compresses the descending L3 nerve roots.     Electronically Signed   By: Wiliam Ke M.D.   On: 08/02/2022 19:12   PATIENT SURVEYS:  FOTO NT    SCREENING FOR RED FLAGS: Bowel or bladder incontinence: No Spinal tumors: No Cauda equina syndrome: No Compression fracture: No Abdominal aneurysm: No   COGNITION: Overall cognitive  status: Within functional limits for tasks assessed                          SENSATION: WFL   MUSCLE LENGTH: Hamstrings: Right 90 deg; Left 90 deg Thomas test: Right 0 deg; Left 0 deg   POSTURE: rounded shoulders   PALPATION: Lumbar, L1-L5 pain radiating outwards to paraspinals    LUMBAR ROM:    AROM eval  Flexion 75%  Extension 85%  Right lateral flexion 75%  Left lateral flexion 75%*  Right rotation 75%  Left rotation 75%   (Blank rows = not tested)   LOWER EXTREMITY ROM:          Active   Right 03/11/2023 Left 03/11/2023  Hip flexion 120 120  Hip extension 30 30  Hip abduction      Hip adduction      Hip internal rotation      Hip external rotation      Knee flexion 135 135  Knee extension 0 0  Ankle dorsiflexion 20 20  Ankle plantarflexion      Ankle inversion      Ankle eversion       (Blank rows = not tested)        LOWER EXTREMITY MMT:     MMT Right eval Left eval  Hip flexion 4- 4-  Hip extension NT NT  Hip abduction 3+ 3+  Hip adduction 3+ 3+  Hip internal rotation NT NT  Hip external rotation NT NT   Knee flexion 4  4  Knee extension 4+ 4+  Ankle dorsiflexion 4+ 4+  Ankle plantarflexion      Ankle inversion      Ankle eversion       (Blank rows = not tested)   LUMBAR SPECIAL TESTS:  Straight leg raise test: Positive, FABER test: Positive, and Thomas test: Positive   FUNCTIONAL TESTS:  5 times sit to stand: 15 sec  30 seconds chair stand test 9 reps    GAIT: Distance walked: 50 ft  Assistive device utilized: None Level of assistance: Complete Independence Comments: Antalgic gait on right side with decrease stand time on right and step length on  left    TODAY'S TREATMENT:                                                                                                                              DATE:   03/18/23: Nu-Step seat and arms at 5 with level 2 resistance for 5 min  FOTO: 44 with target of 49  10 Meter Walk Test   Gait Speed:    1st trial 12.97 sec , 2nd trial 12.58 sec , Avg= 13.64   sec   Fast Gait speed Avg=  10 m /13.11 sec= 0.76 m/sec   -> 1 m/sec [employer] and cross street safely and normal WS  Abdominal MMT: 4/5  -Forward reach with arms  and able to clear scapula off of mat   Sit and Reach 3 x 10   Clinical Test of Sensory Interaction for Balance    (CTSIB):  CONDITION TIME STRATEGY SWAY  Eyes open, firm surface 30 seconds ankle   Eyes closed, firm surface 30 seconds ankle   Eyes open, foam surface  5,4, 7 seconds Stepping Severe   Eyes closed, foam surface 2,3, 2 seconds Stepping              Severe   Romberg eyes closed 3 x 30 sec  Romberg eyes closed with horizontal head turns 2 x 10  Romberg eyes closed with vertical head turns 2 x 10  Half tandem with eyes closed  4 x 30 sec   03/14/23:  10 Meter Walk Test   Gait Speed:    1st trial 12.97 sec , 2nd trial 14.31 sec , Avg= 13.64   sec - Normal Gait speed Avg=  0.73 m/sec    -> 1 m/sec [employer] and cross street safely and normal WS <1 m/sec Need Intervention to reduce falls risk  0.12 m/sec improvement represents a statistically significant improvement in gait speed    - 0.4 - 0.79 m/sec - limited community ambulator - associated with need for intervention to reduce falls; increased risk for LE limitation and death and hospitalization in 1 year; increased dependence for personal care; 2-year mortality and morbidity; increased functional impairments, severe walking disability   DGI: 17/24 Mild Impairments- Head turns, gait and pivot turn, stairs, obstacles, gait level surface, change in gait speed <=19/24 predictive of falls in the elderly   2 minute walk test: 242 ft  -Pt reports increased pain in her right hip  Mean (SD) for retirement dwelling older adults; 150.4 (23.1) meters (Connelly & [place], 2009, Older Adults) MDC (calculated) = 12.2 meters or 40 feet (90% confidence) Justin Mend & Maisie Fus, 2009, Older Adults)  Standing Marches with BUE support  1 x 10    03/11/23:  Sit to Stand from 18 inch mat height 1 x 10  -Pt reports slight increase in pain in bilateral knees. Lower Trunk Rotation 1 x 10 with 3 sec hold  Single Knee to Chest 1 x 10 with 3 sec hold     PATIENT EDUCATION:  Education details: form and technique for correct performance of exercise  Person educated: Patient Education method: Explanation, Demonstration, Verbal cues, and Handouts Education comprehension: verbalized understanding  and returned demonstration   HOME EXERCISE PROGRAM: Access Code: [email or web address] URL: [email or web address] Date: 03/18/2023 Prepared by: Ellin Goodie  Exercises - Supine Lower Trunk Rotation  - 1 x daily - 3 sets - 10 reps - Supine Single Knee to Chest Stretch  - 1 x daily - 3 sets - 10 reps - Sit to Stand with Counter Support  - 3 x weekly - 3 sets - 10 reps - Standing March with Counter Support  - 3 x weekly - 3 sets - 10 reps - Figure-8 Walking Around [employer]  - 1 x daily - 10 reps - 5 ft  hold - Half Tandem Stance Balance with Eyes Closed  - 1 x daily - 3 reps - 30 sec  hold  ASSESSMENT:   CLINICAL IMPRESSION:  Pt presents for treatment after chronic low back pain. She shows deficits with static balance relying mostly on vision and somatosensory input to maintain balance.  She also demonstrates abdominal weakness. She will continue to benefit from skilled PT to address these aforementioned  deficits to return to walking for prolonged periods of time to complete ADLs such as grocery shopping and retrieving her mail to remain independent.   OBJECTIVE IMPAIRMENTS: Abnormal gait, decreased balance, decreased endurance, difficulty walking, decreased strength, hypomobility, impaired flexibility, and pain.    ACTIVITY LIMITATIONS: carrying, lifting, standing, and locomotion level   PARTICIPATION LIMITATIONS: cleaning, laundry, shopping, community activity, yard work, and church   PERSONAL FACTORS: Age, Past/current experiences, and Time since onset of injury/illness/exacerbation are also affecting patient's functional outcome.    REHAB POTENTIAL: Fair chronicity of condition and past failed treatents    CLINICAL DECISION MAKING: Stable/uncomplicated   EVALUATION COMPLEXITY: Low     GOALS: Goals reviewed with patient? No   SHORT TERM GOALS: Target date: 03/25/2023   Pt will be independent with HEP in order to improve strength and balance in order to decrease fall risk and  improve function at home and work. Baseline: NT  Goal status: INITIAL     LONG TERM GOALS: Target date: 05/20/2023   Patient will have improved function and activity level as evidenced by an increase in FOTO score by 10 points or more.  Baseline: 44 Goal status: Ongoing    2.  Patient will perform five sit to stand repetitions in <=15 secs as evidence of LE strength that shows that this community dwelling older adult that is older is at a decreased risk of falling. (Buatois 2010)   Baseline:  15 sec  Goal status: MET   3.  Patient will demonstrate improved LE endurance with completion of >=10 reps as indication of decrease risk of falling for age and gender matched norms to remain independent and avoid serious injury from falling. Baseline: 9 reps  Goal status: Ongoing    4.  Pt will decrease 5TSTS by at least 3 seconds in order to demonstrate clinically significant improvement in LE strength Baseline: 15 sec   Goal status: Ongoing    5.  Pt will increase by at least 12.2 m (40 ft) in order to demonstrate clinically significant improvement in cardiopulmonary endurance and community ambulation Baseline: 242 ft   Goal status: Ongoing    6.  Patient will improve hip and abdominal strength MMT by 1/3 grade MMT (ie 4- to 4) as evidence of improved spinal stability to decrease neurogenic claudication and improve walking distance without being limited by pain.   Baseline: Hip Flex R/L 4-/4-, Hip Abd R/L 3+/3+, Hip Add R/L 3+/3+ Abd 4/5  Goal status: Ongoing   7. Patient will show decreased risk of falling as evidence by decrease in self-selected gait speed by 0.12 m/sec.  Baseline: 0.73 m/sec Goal status: Ongoing     PLAN:   PT FREQUENCY: 1-2x/week   PT DURATION: 10 weeks   PLANNED INTERVENTIONS: Therapeutic exercises, Therapeutic activity, Neuromuscular re-education, Balance training, Gait training, Joint mobilization, Joint manipulation, Stair training, Aquatic Therapy, Dry  Needling, Electrical stimulation, Spinal manipulation, Spinal mobilization, Cryotherapy, Moist heat, Manual therapy, and Re-evaluation.   PLAN FOR NEXT SESSION:  Continue to progress standing strengthening and balance exercises: TM warm up and [place] exercises     Ellin Goodie PT, DPT  03/18/2023, 10:36 AM

## 2023-03-21 ENCOUNTER — Ambulatory Visit: Payer: PPO | Admitting: Physical Therapy

## 2023-03-26 ENCOUNTER — Ambulatory Visit: Payer: PPO | Admitting: Physical Therapy

## 2023-03-26 DIAGNOSIS — R262 Difficulty in walking, not elsewhere classified: Secondary | ICD-10-CM

## 2023-03-26 DIAGNOSIS — M5459 Other low back pain: Secondary | ICD-10-CM | POA: Diagnosis not present

## 2023-03-26 NOTE — Therapy (Addendum)
OUTPATIENT PHYSICAL THERAPY TREATMENT NOTE   Patient Name: Brittney Ramsey MRN: [id number] DOB:[birth year], 77 y.o., female Today's Date: 03/26/2023  PCP: Dr. Larwance Sachs  REFERRING PROVIDER: Burman Freestone FNP   END OF SESSION:   PT End of Session - 03/26/23 1004     Visit Number 4    Number of Visits 20    Date for PT Re-Evaluation 05/20/23    Authorization Type HTA [id number]    Authorization - Visit Number 4    Authorization - Number of Visits 20    Progress Note Due on Visit 10    PT Start Time 0910    PT Stop Time 0955    PT Time Calculation (min) 45 min    Activity Tolerance Patient limited by pain    Behavior During Therapy [facility] for tasks assessed/performed              Past Medical History:  Diagnosis Date   Allergic rhinitis    Allergy    Anxiety    Arthritis    Complication of anesthesia    Hyperlipemia    Hypertension    Neuropathy    legs   PONV (postoperative nausea and vomiting)    Past Surgical History:  Procedure Laterality Date   CATARACT EXTRACTION W/PHACO Right 01/14/2023   Procedure: CATARACT EXTRACTION PHACO AND INTRAOCULAR LENS PLACEMENT (IOC) RIGHT  20.00  01:36.9;  Surgeon: Estanislado Pandy, MD;  Location: [facility];  Service: Ophthalmology;  Laterality: Right;   CATARACT EXTRACTION W/PHACO Left 01/24/2023   Procedure: CATARACT EXTRACTION PHACO AND INTRAOCULAR LENS PLACEMENT (IOC) LEFT  10.51  00:49.3;  Surgeon: Estanislado Pandy, MD;  Location: [facility];  Service: Ophthalmology;  Laterality: Left;   COLONOSCOPY     ESOPHAGEAL MANOMETRY N/A 12/21/2015   Procedure: ESOPHAGEAL MANOMETRY (EM);  Surgeon: Elnita Maxwell, MD;  Location: [facility] END[facility];  Service: Endoscopy;  Laterality: N/A;   ESOPHAGOGASTRODUODENOSCOPY (EGD) WITH PROPOFOL N/A 11/04/2015   Procedure: ESOPHAGOGASTRODUODENOSCOPY (EGD) WITH PROPOFOL;  Surgeon: Wallace Cullens, MD;  Location: [facility] ENDOSCOPY;  Service: Endoscopy;  Laterality: N/A;   HIP ARTHROPLASTY  Right 08/03/2019   Procedure: ARTHROPLASTY BIPOLAR HIP (HEMIARTHROPLASTY);  Surgeon: Donato Heinz, MD;  Location: [facility];  Service: Orthopedics;  Laterality: Right;   L3-4, L4-5 decompression and fusion  10/20/2018   Dr. Delma Officer   TUBAL LIGATION     Patient Active Problem List   Diagnosis Date Noted   HTN (hypertension) 08/02/2019   HLD (hyperlipidemia) 08/02/2019   Hip fracture, right (HCC) 08/02/2019   Hip fracture (HCC) 08/02/2019   Spondylolisthesis of lumbar region 10/20/2018   Hyponatremia 11/02/2015    REFERRING DIAG: DDD Lumbar   THERAPY DIAG:  Other low back pain  Difficulty in walking, not elsewhere classified  Rationale for Evaluation and Treatment Rehabilitation  PERTINENT HISTORY: Pt reports that she had a prior lumbar fusion in 2019 and her lower back pain improved somewhat. However, it has been getting steadily worse and she has already received three steroid injections. She also has also gotten a partial right hip replacement shortly after the back surgery because she fell and shattered her right hip. She reports having instability especially when walking fast and going up and downstairs. She wants to be able walk longer distances to be able to retrieve mail from her mailbox.   PRECAUTIONS: Falls risk   SUBJECTIVE:  SUBJECTIVE STATEMENT: Pt reports that she has been using inhaler for shortness of breath which has been helping some.    PAIN:  Are you having pain? Yes: NPRS scale: 2-3/10 Pain location: Lumbar region  Pain description: Achy Aggravating factors: Standing and walking  Relieving factors: Sitting    OBJECTIVE: (objective measures completed at initial evaluation unless otherwise dated)  OBJECTIVE:    VITALS:bp 135/57 hr 81 SpO2 100   DIAGNOSTIC FINDINGS:   CLINICAL DATA:  Low back pain radiates down posterior right hip into leg   EXAM: MRI LUMBAR SPINE WITHOUT CONTRAST   TECHNIQUE: Multiplanar, multisequence MR imaging of the lumbar spine was performed. No intravenous contrast was administered.   COMPARISON:  08/10/2018 MRI lumbar spine, correlation is also made with 09/01/2019 lumbar spine radiographs   FINDINGS: Segmentation:  5 lumbar-type vertebral bodies.   Alignment: S shaped curvature of the thoracolumbar spine. Trace retrolisthesis of T12 on L1 and L1 on L2, which are new compared to prior. 5 mm retrolisthesis of L2 on L3, also new compared to prior. Decreased anterolisthesis of L3 on L4. No residual anterolisthesis is seen at L4-L5. 5 mm anterolisthesis L5 on S1 is new from the prior exam.   Vertebrae: Status post L3-L5 fusion and decompression. Susceptibility artifact from the hardware limits evaluation of the levels. Within this limitation, no acute fracture or suspicious osseous lesion.   Conus medullaris and cauda equina: Conus extends to the L2 level. Conus and cauda equina appear normal, although evaluation of the postoperative levels is limited by susceptibility artifact.   Paraspinal and other soft tissues: Atrophy of the inferior paraspinous muscles.   Disc levels:   T11-T12: Seen only on the sagittal images. Minimal disc bulge. No spinal canal stenosis or neural foraminal narrowing.   T12-L1: Trace retrolisthesis and minimal disc bulge. Mild facet arthropathy. No spinal canal stenosis or neural foraminal narrowing.   L1-L2: Trace retrolisthesis and minimal disc bulge. Mild facet arthropathy. No spinal canal stenosis or neural foraminal narrowing.   L2-L3: 5 mm retrolisthesis. Moderate facet arthropathy. Ligamentum flavum hypertrophy. Moderate spinal canal stenosis. Effacement of the lateral recesses, likely compressing the descending L3 nerve roots. Mild left neural foraminal narrowing, although  evaluation of the neural foramina is somewhat limited by susceptibility artifact from adjacent hardware.   L3-L4: Status post fusion and decompression. Grade 1 anterolisthesis with disc unroofing. No spinal canal stenosis or neural foraminal narrowing, although evaluation of the neural foramina is somewhat limited by susceptibility artifact.   L4-L5: Status post fusion and decompression. No spinal canal stenosis or definite neural foraminal narrowing, although evaluation of the left neural foramen is limited by susceptibility artifact.   L5-S1: 5 mm anterolisthesis with disc unroofing. Mild facet arthropathy. No spinal canal stenosis or neural foraminal narrowing, although evaluation of the neural foramina is somewhat limited by susceptibility artifact.   IMPRESSION: 1. Status post L3-L5 fusion and decompression, with no evidence of residual spinal canal stenosis or neural foraminal narrowing at these levels. 2. L2-L3 moderate spinal canal stenosis and mild left neural foraminal narrowing, which are new from the prior exam. Effacement of the lateral recesses at this level likely compresses the descending L3 nerve roots.     Electronically Signed   By: Wiliam Ke M.D.   On: 08/02/2022 19:12   PATIENT SURVEYS:  FOTO NT    SCREENING FOR RED FLAGS: Bowel or bladder incontinence: No Spinal tumors: No Cauda equina syndrome: No Compression fracture: No Abdominal aneurysm: No   COGNITION: Overall  cognitive status: Within functional limits for tasks assessed                          SENSATION: WFL   MUSCLE LENGTH: Hamstrings: Right 90 deg; Left 90 deg Thomas test: Right 0 deg; Left 0 deg   POSTURE: rounded shoulders   PALPATION: Lumbar, L1-L5 pain radiating outwards to paraspinals    LUMBAR ROM:    AROM eval  Flexion 75%  Extension 85%  Right lateral flexion 75%  Left lateral flexion 75%*  Right rotation 75%  Left rotation 75%   (Blank rows = not tested)    LOWER EXTREMITY ROM:          Active  Right 03/11/2023 Left 03/11/2023  Hip flexion 120 120  Hip extension 30 30  Hip abduction      Hip adduction      Hip internal rotation      Hip external rotation      Knee flexion 135 135  Knee extension 0 0  Ankle dorsiflexion 20 20  Ankle plantarflexion      Ankle inversion      Ankle eversion       (Blank rows = not tested)        LOWER EXTREMITY MMT:     MMT Right eval Left eval  Hip flexion 4- 4-  Hip extension NT NT  Hip abduction 3+ 3+  Hip adduction 3+ 3+  Hip internal rotation NT NT  Hip external rotation NT NT   Knee flexion 4  4  Knee extension 4+ 4+  Ankle dorsiflexion 4+ 4+  Ankle plantarflexion      Ankle inversion      Ankle eversion       (Blank rows = not tested)   LUMBAR SPECIAL TESTS:  Straight leg raise test: Positive, FABER test: Positive, and Thomas test: Positive   FUNCTIONAL TESTS:  5 times sit to stand: 15 sec  30 seconds chair stand test 9 reps    GAIT: Distance walked: 50 ft  Assistive device utilized: None Level of assistance: Complete Independence Comments: Antalgic gait on right side with decrease stand time on right and step length on  left    TODAY'S TREATMENT:                                                                                                                              DATE:   03/26/23: Nu-Step seat and arms at 6 with level 2 resistance for 5 min  10 m horizontal head turns 4 x 10 -Left sided hip drop   -Decreased stance time on RLE  10 m vertical head turns 4 x 10  Sit to Stand at 18 inch 1 x 10  Sit to Stand using gallon water jug 1 x 3 -Pt reports increased pain in her low back  Sit to Stand using #3 x 2  DBs 2  x 10  Standing HS curls with BUE support  1 x 10 Standing HS curls with 1 UE support 1 x 10  -Difficulty perform on RLE   Standing HS curls with 1 UE support and #3 AW 1 x 10   03/18/23: Nu-Step seat and arms at 5 with level 2 resistance for 5 min   FOTO: 44 with target of 49  10 Meter Walk Test   Gait Speed:    1st trial 12.97 sec , 2nd trial 12.58 sec , Avg= 13.64   sec   Fast Gait speed Avg=  10 m /13.11 sec= 0.76 m/sec   -> 1 m/sec [employer] and cross street safely and normal WS  Abdominal MMT: 4/5  -Forward reach with arms and able to clear scapula off of mat   Sit and Reach 3 x 10   Clinical Test of Sensory Interaction for Balance    (CTSIB):  CONDITION TIME STRATEGY SWAY  Eyes open, firm surface 30 seconds ankle   Eyes closed, firm surface 30 seconds ankle   Eyes open, foam surface 5,4, 7 seconds Stepping Severe   Eyes closed, foam surface 2,3, 2 seconds Stepping              Severe   Romberg eyes closed 3 x 30 sec  Romberg eyes closed with horizontal head turns 2 x 10  Romberg eyes closed with vertical head turns 2 x 10  Half tandem with eyes closed  4 x 30 sec   03/14/23:  10 Meter Walk Test   Gait Speed:    1st trial 12.97 sec , 2nd trial 14.31 sec , Avg= 13.64   sec - Normal Gait speed Avg=  0.73 m/sec    -> 1 m/sec [employer] and cross street safely and normal WS <1 m/sec Need Intervention to reduce falls risk  0.12 m/sec improvement represents a statistically significant improvement in gait speed    - 0.4 - 0.79 m/sec - limited community ambulator - associated with need for intervention to reduce falls; increased risk for LE limitation and death and hospitalization in 1 year; increased dependence for personal care; 2-year mortality and morbidity; increased functional impairments, severe walking disability   DGI: 17/24 Mild Impairments- Head turns, gait and pivot turn, stairs, obstacles, gait level surface, change in gait speed <=19/24 predictive of falls in the elderly   2 minute walk test: 242 ft  -Pt reports increased pain in her right hip  Mean (SD) for retirement dwelling older adults; 150.4 (23.1) meters (Connelly & [place], 2009, Older Adults) MDC (calculated) = 12.2  meters or 40 feet (90% confidence) Justin Mend & Maisie Fus, 2009, Older Adults)  Standing Marches with BUE support  1 x 10    03/11/23:  Sit to Stand from 18 inch mat height 1 x 10  -Pt reports slight increase in pain in bilateral knees. Lower Trunk Rotation 1 x 10 with 3 sec hold  Single Knee to Chest 1 x 10 with 3 sec hold     PATIENT EDUCATION:  Education details: form and technique for correct performance of exercise  Person educated: Patient Education method: Explanation, Demonstration, Verbal cues, and Handouts Education comprehension: verbalized understanding and returned demonstration   HOME EXERCISE PROGRAM: Access Code: [email or web address] URL: [email or web address] Date: 03/26/2023 Prepared by: Ellin Goodie  Exercises - Supine Lower Trunk Rotation  - 1 x daily - 3 sets - 10 reps - Supine Single Knee to Chest Stretch  - 1 x daily -  3 sets - 10 reps - Standing March with Counter Support  - 3 x weekly - 3 sets - 10 reps - Figure-8 Walking Around [employer]  - 1 x daily - 10 reps - 5 ft  hold - Half Tandem Stance Balance with Eyes Closed  - 1 x daily - 3 reps - 30 sec  hold - Sit to Stand  - 3 x weekly - 3 sets - 10 reps - Standing Knee Flexion AROM with Chair Support  - 3 x weekly - 3 sets - 10 reps  ASSESSMENT:   CLINICAL IMPRESSION:  Pt presents for treatment after chronic low back pain. She continues to show decreased right hip stability with decreased stance time on RLE along and step length on LLE along with left lateral lean to adjust COM. Walking also triggers neurogenic claudication especially with patient attempting to perform with upright posture. She does show improved LE strength with ability to perform sit to stand with increased resistance. She will continue to benefit from skilled PT to address these aforementioned deficits to return to walking for prolonged periods of time to complete ADLs such as grocery shopping and retrieving her mail to remain independent.    OBJECTIVE IMPAIRMENTS: Abnormal gait, decreased balance, decreased endurance, difficulty walking, decreased strength, hypomobility, impaired flexibility, and pain.    ACTIVITY LIMITATIONS: carrying, lifting, standing, and locomotion level   PARTICIPATION LIMITATIONS: cleaning, laundry, shopping, community activity, yard work, and church   PERSONAL FACTORS: Age, Past/current experiences, and Time since onset of injury/illness/exacerbation are also affecting patient's functional outcome.    REHAB POTENTIAL: Fair chronicity of condition and past failed treatents    CLINICAL DECISION MAKING: Stable/uncomplicated   EVALUATION COMPLEXITY: Low     GOALS: Goals reviewed with patient? No   SHORT TERM GOALS: Target date: 03/25/2023   Pt will be independent with HEP in order to improve strength and balance in order to decrease fall risk and improve function at home and work. Baseline: NT  Goal status: Ongoing      LONG TERM GOALS: Target date: 05/20/2023   Patient will have improved function and activity level as evidenced by an increase in FOTO score by 10 points or more.  Baseline: 44 Goal status: Ongoing    2.  Patient will perform five sit to stand repetitions in <=15 secs as evidence of LE strength that shows that this community dwelling older adult that is older is at a decreased risk of falling. (Buatois 2010)   Baseline:  15 sec  Goal status: MET   3.  Patient will demonstrate improved LE endurance with completion of >=10 reps as indication of decrease risk of falling for age and gender matched norms to remain independent and avoid serious injury from falling. Baseline: 9 reps  Goal status: Ongoing    4.  Pt will decrease 5TSTS by at least 3 seconds in order to demonstrate clinically significant improvement in LE strength Baseline: 15 sec   Goal status: Ongoing    5.  Pt will increase by at least 12.2 m (40 ft) in order to demonstrate clinically significant improvement in  cardiopulmonary endurance and community ambulation Baseline: 242 ft   Goal status: Ongoing    6.  Patient will improve hip and abdominal strength MMT by 1/3 grade MMT (ie 4- to 4) as evidence of improved spinal stability to decrease neurogenic claudication and improve walking distance without being limited by pain.   Baseline: Hip Flex R/L 4-/4-, Hip Abd R/L  3+/3+, Hip Add R/L 3+/3+ Abd 4/5  Goal status: Ongoing   7. Patient will show decreased risk of falling as evidence by decrease in self-selected gait speed by 0.12 m/sec.  Baseline: 0.73 m/sec Goal status: Ongoing     PLAN:   PT FREQUENCY: 1-2x/week   PT DURATION: 10 weeks   PLANNED INTERVENTIONS: Therapeutic exercises, Therapeutic activity, Neuromuscular re-education, Balance training, Gait training, Joint mobilization, Joint manipulation, Stair training, Aquatic Therapy, Dry Needling, Electrical stimulation, Spinal manipulation, Spinal mobilization, Cryotherapy, Moist heat, Manual therapy, and Re-evaluation.   PLAN FOR NEXT SESSION: Rollator for warm up and attempt [place] and dynamic balance exercises. OMEGA machines to strengthen right hip     Ellin Goodie PT, DPT  03/26/2023, 10:18 AM

## 2023-03-28 ENCOUNTER — Encounter: Payer: Self-pay | Admitting: Physical Therapy

## 2023-03-28 ENCOUNTER — Ambulatory Visit: Payer: PPO

## 2023-03-28 DIAGNOSIS — M5459 Other low back pain: Secondary | ICD-10-CM

## 2023-03-28 DIAGNOSIS — R262 Difficulty in walking, not elsewhere classified: Secondary | ICD-10-CM

## 2023-03-28 NOTE — Therapy (Addendum)
OUTPATIENT PHYSICAL THERAPY TREATMENT NOTE   Patient Name: Brittney Ramsey MRN: [id number] DOB:07/20/1946, 77 y.o., female Today's Date: 03/28/2023  PCP: Dr. Larwance Sachs  REFERRING PROVIDER: Burman Freestone FNP   END OF SESSION:   PT End of Session - 03/28/23 1030     Visit Number 5    Number of Visits 20    Date for PT Re-Evaluation 05/20/23    Authorization Type HTA [id number]    Authorization - Visit Number 5    Authorization - Number of Visits 20    Progress Note Due on Visit 10    PT Start Time 1031    PT Stop Time 1113    PT Time Calculation (min) 42 min    Activity Tolerance Patient limited by pain    Behavior During Therapy WFL for tasks assessed/performed               Past Medical History:  Diagnosis Date   Allergic rhinitis    Allergy    Anxiety    Arthritis    Complication of anesthesia    Hyperlipemia    Hypertension    Neuropathy    legs   PONV (postoperative nausea and vomiting)    Past Surgical History:  Procedure Laterality Date   CATARACT EXTRACTION W/PHACO Right 01/14/2023   Procedure: CATARACT EXTRACTION PHACO AND INTRAOCULAR LENS PLACEMENT (IOC) RIGHT  20.00  01:36.9;  Surgeon: Estanislado Pandy, MD;  Location: [facility];  Service: Ophthalmology;  Laterality: Right;   CATARACT EXTRACTION W/PHACO Left 01/24/2023   Procedure: CATARACT EXTRACTION PHACO AND INTRAOCULAR LENS PLACEMENT (IOC) LEFT  10.51  00:49.3;  Surgeon: Estanislado Pandy, MD;  Location: [facility];  Service: Ophthalmology;  Laterality: Left;   COLONOSCOPY     ESOPHAGEAL MANOMETRY N/A 12/21/2015   Procedure: ESOPHAGEAL MANOMETRY (EM);  Surgeon: Elnita Maxwell, MD;  Location: [facility];  Service: Endoscopy;  Laterality: N/A;   ESOPHAGOGASTRODUODENOSCOPY (EGD) WITH PROPOFOL N/A 11/04/2015   Procedure: ESOPHAGOGASTRODUODENOSCOPY (EGD) WITH PROPOFOL;  Surgeon: Wallace Cullens, MD;  Location: [facility];  Service: Endoscopy;  Laterality: N/A;   HIP  ARTHROPLASTY Right 08/03/2019   Procedure: ARTHROPLASTY BIPOLAR HIP (HEMIARTHROPLASTY);  Surgeon: Donato Heinz, MD;  Location: [facility];  Service: Orthopedics;  Laterality: Right;   L3-4, L4-5 decompression and fusion  10/20/2018   Dr. Delma Officer   TUBAL LIGATION     Patient Active Problem List   Diagnosis Date Noted   HTN (hypertension) 08/02/2019   HLD (hyperlipidemia) 08/02/2019   Hip fracture, right (HCC) 08/02/2019   Hip fracture (HCC) 08/02/2019   Spondylolisthesis of lumbar region 10/20/2018   Hyponatremia 11/02/2015    REFERRING DIAG: DDD Lumbar   THERAPY DIAG:  Other low back pain  Difficulty in walking, not elsewhere classified  Rationale for Evaluation and Treatment Rehabilitation  PERTINENT HISTORY: Pt reports that she had a prior lumbar fusion in 2019 and her lower back pain improved somewhat. However, it has been getting steadily worse and she has already received three steroid injections. She also has also gotten a partial right hip replacement shortly after the back surgery because she fell and shattered her right hip. She reports having instability especially when walking fast and going up and downstairs. She wants to be able walk longer distances to be able to retrieve mail from her mailbox.   PRECAUTIONS: Falls risk   SUBJECTIVE:  SUBJECTIVE STATEMENT: Patient reports making her bed this morning and feeling as though she wore her back out. Reports 5-6/10 pain in her back.      PAIN:  Are you having pain? Yes: NPRS scale: 2-3/10 Pain location: Lumbar region  Pain description: Achy Aggravating factors: Standing and walking  Relieving factors: Sitting    OBJECTIVE: (objective measures completed at initial evaluation unless otherwise dated)  OBJECTIVE:    VITALS:bp 135/57 hr  81 SpO2 100   DIAGNOSTIC FINDINGS:  CLINICAL DATA:  Low back pain radiates down posterior right hip into leg   EXAM: MRI LUMBAR SPINE WITHOUT CONTRAST   TECHNIQUE: Multiplanar, multisequence MR imaging of the lumbar spine was performed. No intravenous contrast was administered.   COMPARISON:  08/10/2018 MRI lumbar spine, correlation is also made with 09/01/2019 lumbar spine radiographs   FINDINGS: Segmentation:  5 lumbar-type vertebral bodies.   Alignment: S shaped curvature of the thoracolumbar spine. Trace retrolisthesis of T12 on L1 and L1 on L2, which are new compared to prior. 5 mm retrolisthesis of L2 on L3, also new compared to prior. Decreased anterolisthesis of L3 on L4. No residual anterolisthesis is seen at L4-L5. 5 mm anterolisthesis L5 on S1 is new from the prior exam.   Vertebrae: Status post L3-L5 fusion and decompression. Susceptibility artifact from the hardware limits evaluation of the levels. Within this limitation, no acute fracture or suspicious osseous lesion.   Conus medullaris and cauda equina: Conus extends to the L2 level. Conus and cauda equina appear normal, although evaluation of the postoperative levels is limited by susceptibility artifact.   Paraspinal and other soft tissues: Atrophy of the inferior paraspinous muscles.   Disc levels:   T11-T12: Seen only on the sagittal images. Minimal disc bulge. No spinal canal stenosis or neural foraminal narrowing.   T12-L1: Trace retrolisthesis and minimal disc bulge. Mild facet arthropathy. No spinal canal stenosis or neural foraminal narrowing.   L1-L2: Trace retrolisthesis and minimal disc bulge. Mild facet arthropathy. No spinal canal stenosis or neural foraminal narrowing.   L2-L3: 5 mm retrolisthesis. Moderate facet arthropathy. Ligamentum flavum hypertrophy. Moderate spinal canal stenosis. Effacement of the lateral recesses, likely compressing the descending L3 nerve roots. Mild left  neural foraminal narrowing, although evaluation of the neural foramina is somewhat limited by susceptibility artifact from adjacent hardware.   L3-L4: Status post fusion and decompression. Grade 1 anterolisthesis with disc unroofing. No spinal canal stenosis or neural foraminal narrowing, although evaluation of the neural foramina is somewhat limited by susceptibility artifact.   L4-L5: Status post fusion and decompression. No spinal canal stenosis or definite neural foraminal narrowing, although evaluation of the left neural foramen is limited by susceptibility artifact.   L5-S1: 5 mm anterolisthesis with disc unroofing. Mild facet arthropathy. No spinal canal stenosis or neural foraminal narrowing, although evaluation of the neural foramina is somewhat limited by susceptibility artifact.   IMPRESSION: 1. Status post L3-L5 fusion and decompression, with no evidence of residual spinal canal stenosis or neural foraminal narrowing at these levels. 2. L2-L3 moderate spinal canal stenosis and mild left neural foraminal narrowing, which are new from the prior exam. Effacement of the lateral recesses at this level likely compresses the descending L3 nerve roots.     Electronically Signed   By: Wiliam Ke M.D.   On: 08/02/2022 19:12   PATIENT SURVEYS:  FOTO NT    SCREENING FOR RED FLAGS: Bowel or bladder incontinence: No Spinal tumors: No Cauda equina syndrome: No Compression fracture: No  Abdominal aneurysm: No   COGNITION: Overall cognitive status: Within functional limits for tasks assessed                          SENSATION: WFL   MUSCLE LENGTH: Hamstrings: Right 90 deg; Left 90 deg Thomas test: Right 0 deg; Left 0 deg   POSTURE: rounded shoulders   PALPATION: Lumbar, L1-L5 pain radiating outwards to paraspinals    LUMBAR ROM:    AROM eval  Flexion 75%  Extension 85%  Right lateral flexion 75%  Left lateral flexion 75%*  Right rotation 75%  Left  rotation 75%   (Blank rows = not tested)   LOWER EXTREMITY ROM:          Active  Right 03/11/2023 Left 03/11/2023  Hip flexion 120 120  Hip extension 30 30  Hip abduction      Hip adduction      Hip internal rotation      Hip external rotation      Knee flexion 135 135  Knee extension 0 0  Ankle dorsiflexion 20 20  Ankle plantarflexion      Ankle inversion      Ankle eversion       (Blank rows = not tested)        LOWER EXTREMITY MMT:     MMT Right eval Left eval  Hip flexion 4- 4-  Hip extension NT NT  Hip abduction 3+ 3+  Hip adduction 3+ 3+  Hip internal rotation NT NT  Hip external rotation NT NT   Knee flexion 4  4  Knee extension 4+ 4+  Ankle dorsiflexion 4+ 4+  Ankle plantarflexion      Ankle inversion      Ankle eversion       (Blank rows = not tested)   LUMBAR SPECIAL TESTS:  Straight leg raise test: Positive, FABER test: Positive, and Thomas test: Positive   FUNCTIONAL TESTS:  5 times sit to stand: 15 sec  30 seconds chair stand test 9 reps    GAIT: Distance walked: 50 ft  Assistive device utilized: None Level of assistance: Complete Independence Comments: Antalgic gait on right side with decrease stand time on right and step length on  left    TODAY'S TREATMENT:                                                                                                                              DATE:   03/28/23:  Nu-Step seat and arms at 6 with level 2 resistance for 5 min  Lower trunk rotation x 15 with 3 second hold  Supine knee to chest stretch 2 x 30 seconds each LE  Supine bridge x 10  SLR 2 x 10 each LE Modified dead bug 2 x 10 each side  TA activation with red physioball 2 x 10 with 3 second hold   Seated trunk flexion with  red physioball 3 x 10 second hold  Seated row BTB 2 x 15  Standing paloff press x 15 each side, increase in pain in standing  03/26/23: Nu-Step seat and arms at 6 with level 2 resistance for 5 min  10 m horizontal  head turns 4 x 10 -Left sided hip drop   -Decreased stance time on RLE  10 m vertical head turns 4 x 10  Sit to Stand at 18 inch 1 x 10  Sit to Stand using gallon water jug 1 x 3 -Pt reports increased pain in her low back  Sit to Stand using #3 x 2  DBs 2 x 10  Standing HS curls with BUE support  1 x 10 Standing HS curls with 1 UE support 1 x 10  -Difficulty perform on RLE   Standing HS curls with 1 UE support and #3 AW 1 x 10   03/18/23: Nu-Step seat and arms at 5 with level 2 resistance for 5 min  FOTO: 44 with target of 49  10 Meter Walk Test   Gait Speed:    1st trial 12.97 sec , 2nd trial 12.58 sec , Avg= 13.64   sec   Fast Gait speed Avg=  10 m /13.11 sec= 0.76 m/sec   -> 1 m/sec [employer] and cross street safely and normal WS  Abdominal MMT: 4/5  -Forward reach with arms and able to clear scapula off of mat   Sit and Reach 3 x 10   Clinical Test of Sensory Interaction for Balance    (CTSIB):  CONDITION TIME STRATEGY SWAY  Eyes open, firm surface 30 seconds ankle   Eyes closed, firm surface 30 seconds ankle   Eyes open, foam surface 5,4, 7 seconds Stepping Severe   Eyes closed, foam surface 2,3, 2 seconds Stepping              Severe   Romberg eyes closed 3 x 30 sec  Romberg eyes closed with horizontal head turns 2 x 10  Romberg eyes closed with vertical head turns 2 x 10  Half tandem with eyes closed  4 x 30 sec   03/14/23:  10 Meter Walk Test   Gait Speed:    1st trial 12.97 sec , 2nd trial 14.31 sec , Avg= 13.64   sec - Normal Gait speed Avg=  0.73 m/sec    -> 1 m/sec [employer] and cross street safely and normal WS <1 m/sec Need Intervention to reduce falls risk  0.12 m/sec improvement represents a statistically significant improvement in gait speed    - 0.4 - 0.79 m/sec - limited community ambulator - associated with need for intervention to reduce falls; increased risk for LE limitation and death and hospitalization in 1 year;  increased dependence for personal care; 2-year mortality and morbidity; increased functional impairments, severe walking disability   DGI: 17/24 Mild Impairments- Head turns, gait and pivot turn, stairs, obstacles, gait level surface, change in gait speed <=19/24 predictive of falls in the elderly   2 minute walk test: 242 ft  -Pt reports increased pain in her right hip  Mean (SD) for retirement dwelling older adults; 150.4 (23.1) meters (Connelly & Palmer, 2009, Older Adults) MDC (calculated) = 12.2 meters or 40 feet (90% confidence) Justin Mend & Maisie Fus, 2009, Older Adults)  Standing Marches with BUE support  1 x 10        PATIENT EDUCATION:  Education details: form and technique for correct performance of exercise  Person educated: Patient Education method: Explanation, Demonstration, Verbal cues, and Handouts Education comprehension: verbalized understanding and returned demonstration   HOME EXERCISE PROGRAM: Access Code: [email or web address] URL: [email or web address] Date: 03/26/2023 Prepared by: Ellin Goodie  Exercises - Supine Lower Trunk Rotation  - 1 x daily - 3 sets - 10 reps - Supine Single Knee to Chest Stretch  - 1 x daily - 3 sets - 10 reps - Standing March with Counter Support  - 3 x weekly - 3 sets - 10 reps - Figure-8 Walking Around [employer]  - 1 x daily - 10 reps - 5 ft  hold - Half Tandem Stance Balance with Eyes Closed  - 1 x daily - 3 reps - 30 sec  hold - Sit to Stand  - 3 x weekly - 3 sets - 10 reps - Standing Knee Flexion AROM with Chair Support  - 3 x weekly - 3 sets - 10 reps  ASSESSMENT:   CLINICAL IMPRESSION: Patient presents to session this date with increased pain from 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 Adjusted treatment session to patient's pain tolerance. Session focused on core activation/strengthening and lower back stretching. Patient continues to demonstrate core and LE weakness. Patient will continue to benefit from skilled therapy to address  remaining deficits in order to improve quality of life and return to PLOF.     OBJECTIVE IMPAIRMENTS: Abnormal gait, decreased balance, decreased endurance, difficulty walking, decreased strength, hypomobility, impaired flexibility, and pain.    ACTIVITY LIMITATIONS: carrying, lifting, standing, and locomotion level   PARTICIPATION LIMITATIONS: cleaning, laundry, shopping, community activity, yard work, and church   PERSONAL FACTORS: Age, Past/current experiences, and Time since onset of injury/illness/exacerbation are also affecting patient's functional outcome.    REHAB POTENTIAL: Fair chronicity of condition and past failed treatents    CLINICAL DECISION MAKING: Stable/uncomplicated   EVALUATION COMPLEXITY: Low     GOALS: Goals reviewed with patient? No   SHORT TERM GOALS: Target date: 03/25/2023   Pt will be independent with HEP in order to improve strength and balance in order to decrease fall risk and improve function at home and work. Baseline: NT  Goal status: Ongoing      LONG TERM GOALS: Target date: 05/20/2023   Patient will have improved function and activity level as evidenced by an increase in FOTO score by 10 points or more.  Baseline: 44 Goal status: Ongoing    2.  Patient will perform five sit to stand repetitions in <=15 secs as evidence of LE strength that shows that this community dwelling older adult that is older is at a decreased risk of falling. (Buatois 2010)   Baseline:  15 sec  Goal status: MET   3.  Patient will demonstrate improved LE endurance with completion of >=10 reps as indication of decrease risk of falling for age and gender matched norms to remain independent and avoid serious injury from falling. Baseline: 9 reps  Goal status: Ongoing    4.  Pt will decrease 5TSTS by at least 3 seconds in order to demonstrate clinically significant improvement in LE strength Baseline: 15 sec   Goal status: Ongoing    5.  Pt will increase by at  least 12.2 m (40 ft) in order to demonstrate clinically significant improvement in cardiopulmonary endurance and community ambulation Baseline: 242 ft   Goal status: Ongoing    6.  Patient will improve hip and abdominal strength MMT by 1/3 grade MMT (ie 4- to 4) as evidence of improved spinal  stability to decrease neurogenic claudication and improve walking distance without being limited by pain.   Baseline: Hip Flex R/L 4-/4-, Hip Abd R/L 3+/3+, Hip Add R/L 3+/3+ Abd 4/5  Goal status: Ongoing   7. Patient will show decreased risk of falling as evidence by decrease in self-selected gait speed by 0.12 m/sec.  Baseline: 0.73 m/sec Goal status: Ongoing     PLAN:   PT FREQUENCY: 1-2x/week   PT DURATION: 10 weeks   PLANNED INTERVENTIONS: Therapeutic exercises, Therapeutic activity, Neuromuscular re-education, Balance training, Gait training, Joint mobilization, Joint manipulation, Stair training, Aquatic Therapy, Dry Needling, Electrical stimulation, Spinal manipulation, Spinal mobilization, Cryotherapy, Moist heat, Manual therapy, and Re-evaluation.   PLAN FOR NEXT SESSION: Rollator for warm up and attempt [place] and dynamic balance exercises. OMEGA machines to strengthen right hip    Maylon Peppers, PT, DPT Physical Therapist - [facility]   03/28/2023, 11:14 AM

## 2023-04-02 ENCOUNTER — Ambulatory Visit: Payer: PPO | Admitting: Physical Therapy

## 2023-04-02 DIAGNOSIS — M5459 Other low back pain: Secondary | ICD-10-CM

## 2023-04-02 DIAGNOSIS — R262 Difficulty in walking, not elsewhere classified: Secondary | ICD-10-CM

## 2023-04-02 NOTE — Therapy (Signed)
OUTPATIENT PHYSICAL THERAPY TREATMENT NOTE   Patient Name: Brittney Ramsey MRN: [id number] DOB:May 23, 1946, 77 y.o., female Today's Date: 04/02/2023  PCP: Dr. Larwance Sachs  REFERRING PROVIDER: Burman Freestone FNP   END OF SESSION:   PT End of Session - 04/02/23 1033     Visit Number 6    Number of Visits 20    Date for PT Re-Evaluation 05/20/23    Authorization Type HTA [id number]    Authorization - Visit Number 6    Authorization - Number of Visits 20    Progress Note Due on Visit 10    PT Start Time 1030    PT Stop Time 1115    PT Time Calculation (min) 45 min    Activity Tolerance Patient tolerated treatment well    Behavior During Therapy WFL for tasks assessed/performed               Past Medical History:  Diagnosis Date   Allergic rhinitis    Allergy    Anxiety    Arthritis    Complication of anesthesia    Hyperlipemia    Hypertension    Neuropathy    legs   PONV (postoperative nausea and vomiting)    Past Surgical History:  Procedure Laterality Date   CATARACT EXTRACTION W/PHACO Right 01/14/2023   Procedure: CATARACT EXTRACTION PHACO AND INTRAOCULAR LENS PLACEMENT (IOC) RIGHT  20.00  01:36.9;  Surgeon: Estanislado Pandy, MD;  Location: [facility];  Service: Ophthalmology;  Laterality: Right;   CATARACT EXTRACTION W/PHACO Left 01/24/2023   Procedure: CATARACT EXTRACTION PHACO AND INTRAOCULAR LENS PLACEMENT (IOC) LEFT  10.51  00:49.3;  Surgeon: Estanislado Pandy, MD;  Location: [facility];  Service: Ophthalmology;  Laterality: Left;   COLONOSCOPY     ESOPHAGEAL MANOMETRY N/A 12/21/2015   Procedure: ESOPHAGEAL MANOMETRY (EM);  Surgeon: Elnita Maxwell, MD;  Location: [facility];  Service: Endoscopy;  Laterality: N/A;   ESOPHAGOGASTRODUODENOSCOPY (EGD) WITH PROPOFOL N/A 11/04/2015   Procedure: ESOPHAGOGASTRODUODENOSCOPY (EGD) WITH PROPOFOL;  Surgeon: Wallace Cullens, MD;  Location: [facility];  Service: Endoscopy;  Laterality: N/A;   HIP  ARTHROPLASTY Right 08/03/2019   Procedure: ARTHROPLASTY BIPOLAR HIP (HEMIARTHROPLASTY);  Surgeon: Donato Heinz, MD;  Location: [facility];  Service: Orthopedics;  Laterality: Right;   L3-4, L4-5 decompression and fusion  10/20/2018   Dr. Delma Officer   TUBAL LIGATION     Patient Active Problem List   Diagnosis Date Noted   HTN (hypertension) 08/02/2019   HLD (hyperlipidemia) 08/02/2019   Hip fracture, right (HCC) 08/02/2019   Hip fracture (HCC) 08/02/2019   Spondylolisthesis of lumbar region 10/20/2018   Hyponatremia 11/02/2015    REFERRING DIAG: DDD Lumbar   THERAPY DIAG:  Other low back pain  Difficulty in walking, not elsewhere classified  Rationale for Evaluation and Treatment Rehabilitation  PERTINENT HISTORY: Pt reports that she had a prior lumbar fusion in 2019 and her lower back pain improved somewhat. However, it has been getting steadily worse and she has already received three steroid injections. She also has also gotten a partial right hip replacement shortly after the back surgery because she fell and shattered her right hip. She reports having instability especially when walking fast and going up and downstairs. She wants to be able walk longer distances to be able to retrieve mail from her mailbox.   PRECAUTIONS: Falls risk   SUBJECTIVE:  SUBJECTIVE STATEMENT: Pt reports that she has not been feeling too much pain lately. She continues to do exercises without much difficulty. She struggles to do some of her exercises because she is doing them on her bed, which is a softer surface.      PAIN:  Are you having pain? No   OBJECTIVE: (objective measures completed at initial evaluation unless otherwise dated)  OBJECTIVE:    VITALS:bp 135/57 hr 81 SpO2 100   DIAGNOSTIC FINDINGS:   CLINICAL DATA:  Low back pain radiates down posterior right hip into leg   EXAM: MRI LUMBAR SPINE WITHOUT CONTRAST   TECHNIQUE: Multiplanar, multisequence MR imaging of the lumbar spine was performed. No intravenous contrast was administered.   COMPARISON:  08/10/2018 MRI lumbar spine, correlation is also made with 09/01/2019 lumbar spine radiographs   FINDINGS: Segmentation:  5 lumbar-type vertebral bodies.   Alignment: S shaped curvature of the thoracolumbar spine. Trace retrolisthesis of T12 on L1 and L1 on L2, which are new compared to prior. 5 mm retrolisthesis of L2 on L3, also new compared to prior. Decreased anterolisthesis of L3 on L4. No residual anterolisthesis is seen at L4-L5. 5 mm anterolisthesis L5 on S1 is new from the prior exam.   Vertebrae: Status post L3-L5 fusion and decompression. Susceptibility artifact from the hardware limits evaluation of the levels. Within this limitation, no acute fracture or suspicious osseous lesion.   Conus medullaris and cauda equina: Conus extends to the L2 level. Conus and cauda equina appear normal, although evaluation of the postoperative levels is limited by susceptibility artifact.   Paraspinal and other soft tissues: Atrophy of the inferior paraspinous muscles.   Disc levels:   T11-T12: Seen only on the sagittal images. Minimal disc bulge. No spinal canal stenosis or neural foraminal narrowing.   T12-L1: Trace retrolisthesis and minimal disc bulge. Mild facet arthropathy. No spinal canal stenosis or neural foraminal narrowing.   L1-L2: Trace retrolisthesis and minimal disc bulge. Mild facet arthropathy. No spinal canal stenosis or neural foraminal narrowing.   L2-L3: 5 mm retrolisthesis. Moderate facet arthropathy. Ligamentum flavum hypertrophy. Moderate spinal canal stenosis. Effacement of the lateral recesses, likely compressing the descending L3 nerve roots. Mild left neural foraminal narrowing, although  evaluation of the neural foramina is somewhat limited by susceptibility artifact from adjacent hardware.   L3-L4: Status post fusion and decompression. Grade 1 anterolisthesis with disc unroofing. No spinal canal stenosis or neural foraminal narrowing, although evaluation of the neural foramina is somewhat limited by susceptibility artifact.   L4-L5: Status post fusion and decompression. No spinal canal stenosis or definite neural foraminal narrowing, although evaluation of the left neural foramen is limited by susceptibility artifact.   L5-S1: 5 mm anterolisthesis with disc unroofing. Mild facet arthropathy. No spinal canal stenosis or neural foraminal narrowing, although evaluation of the neural foramina is somewhat limited by susceptibility artifact.   IMPRESSION: 1. Status post L3-L5 fusion and decompression, with no evidence of residual spinal canal stenosis or neural foraminal narrowing at these levels. 2. L2-L3 moderate spinal canal stenosis and mild left neural foraminal narrowing, which are new from the prior exam. Effacement of the lateral recesses at this level likely compresses the descending L3 nerve roots.     Electronically Signed   By: Wiliam Ke M.D.   On: 08/02/2022 19:12   PATIENT SURVEYS:  FOTO NT    SCREENING FOR RED FLAGS: Bowel or bladder incontinence: No Spinal tumors: No Cauda equina syndrome: No Compression fracture: No Abdominal aneurysm:  No   COGNITION: Overall cognitive status: Within functional limits for tasks assessed                          SENSATION: WFL   MUSCLE LENGTH: Hamstrings: Right 90 deg; Left 90 deg Thomas test: Right 0 deg; Left 0 deg   POSTURE: rounded shoulders   PALPATION: Lumbar, L1-L5 pain radiating outwards to paraspinals    LUMBAR ROM:    AROM eval  Flexion 75%  Extension 85%  Right lateral flexion 75%  Left lateral flexion 75%*  Right rotation 75%  Left rotation 75%   (Blank rows = not tested)    LOWER EXTREMITY ROM:          Active  Right 03/11/2023 Left 03/11/2023  Hip flexion 120 120  Hip extension 30 30  Hip abduction      Hip adduction      Hip internal rotation      Hip external rotation      Knee flexion 135 135  Knee extension 0 0  Ankle dorsiflexion 20 20  Ankle plantarflexion      Ankle inversion      Ankle eversion       (Blank rows = not tested)        LOWER EXTREMITY MMT:     MMT Right eval Left eval  Hip flexion 4- 4-  Hip extension NT NT  Hip abduction 3+ 3+  Hip adduction 3+ 3+  Hip internal rotation NT NT  Hip external rotation NT NT   Knee flexion 4  4  Knee extension 4+ 4+  Ankle dorsiflexion 4+ 4+  Ankle plantarflexion      Ankle inversion      Ankle eversion       (Blank rows = not tested)   LUMBAR SPECIAL TESTS:  Straight leg raise test: Positive, FABER test: Positive, and Thomas test: Positive   FUNCTIONAL TESTS:  5 times sit to stand: 15 sec  30 seconds chair stand test 9 reps    GAIT: Distance walked: 50 ft  Assistive device utilized: None Level of assistance: Complete Independence Comments: Antalgic gait on right side with decrease stand time on right and step length on  left    TODAY'S TREATMENT:                                                                                                                              DATE:   04/02/23: Nu-step seat and arms at 6 with level 2 resistance for 5 min  5xSTS: 12 sec  30 sec chair stands: 10 reps  FOTO: 53  Over ground ambulation 21 ft x 8  Standing Hip Abduction with BUE support 2 x 10  -Pt unable to abduct RLE without compensation with hip flexor  Seated Hip Abduction with yellow band 1 x 10  Seated Hip Abduction with red band 1 x 10  Seated Hip Abduction with green band 1 x 10  Seated Hip Abduction with blue band 1 x 10 Overground ambulation with SPC 26 ft x 8 in 2 min 34 sec  -NRPS 3/10 and lower than without using [facility]   03/28/23:  Nu-Step seat and arms at 6  with level 2 resistance for 5 min  Lower trunk rotation x 15 with 3 second hold  Supine knee to chest stretch 2 x 30 seconds each LE  Supine bridge x 10  SLR 2 x 10 each LE Modified dead bug 2 x 10 each side  TA activation with red physioball 2 x 10 with 3 second hold   Seated trunk flexion with red physioball 3 x 10 second hold  Seated row BTB 2 x 15  Standing paloff press x 15 each side, increase in pain in standing  03/26/23: Nu-Step seat and arms at 6 with level 2 resistance for 5 min  10 m horizontal head turns 4 x 10 -Left sided hip drop   -Decreased stance time on RLE  10 m vertical head turns 4 x 10  Sit to Stand at 18 inch 1 x 10  Sit to Stand using gallon water jug 1 x 3 -Pt reports increased pain in her low back  Sit to Stand using #3 x 2  DBs 2 x 10  Standing HS curls with BUE support  1 x 10 Standing HS curls with 1 UE support 1 x 10  -Difficulty perform on RLE   Standing HS curls with 1 UE support and #3 AW 1 x 10   03/18/23: Nu-Step seat and arms at 5 with level 2 resistance for 5 min  FOTO: 44 with target of 49 04/02/23: 53  10 Meter Walk Test   Gait Speed:    1st trial 12.97 sec , 2nd trial 12.58 sec , Avg= 13.64   sec   Fast Gait speed Avg=  10 m /13.11 sec= 0.76 m/sec   -> 1 m/sec [employer] and cross street safely and normal WS  Abdominal MMT: 4/5  -Forward reach with arms and able to clear scapula off of mat   Sit and Reach 3 x 10   Clinical Test of Sensory Interaction for Balance    (CTSIB):  CONDITION TIME STRATEGY SWAY  Eyes open, firm surface 30 seconds ankle   Eyes closed, firm surface 30 seconds ankle   Eyes open, foam surface 5,4, 7 seconds Stepping Severe   Eyes closed, foam surface 2,3, 2 seconds Stepping              Severe   Romberg eyes closed 3 x 30 sec  Romberg eyes closed with horizontal head turns 2 x 10  Romberg eyes closed with vertical head turns 2 x 10  Half tandem with eyes closed  4 x 30 sec   03/14/23:  10 Meter  Walk Test   Gait Speed:    1st trial 12.97 sec , 2nd trial 14.31 sec , Avg= 13.64   sec - Normal Gait speed Avg=  0.73 m/sec    -> 1 m/sec [employer] and cross street safely and normal WS <1 m/sec Need Intervention to reduce falls risk  0.12 m/sec improvement represents a statistically significant improvement in gait speed    - 0.4 - 0.79 m/sec - limited community ambulator - associated with need for intervention to reduce falls; increased risk for LE limitation and death and hospitalization in 1 year; increased dependence for  personal care; 2-year mortality and morbidity; increased functional impairments, severe walking disability   DGI: 17/24 Mild Impairments- Head turns, gait and pivot turn, stairs, obstacles, gait level surface, change in gait speed <=19/24 predictive of falls in the elderly   2 minute walk test: 242 ft  -Pt reports increased pain in her right hip  Mean (SD) for retirement dwelling older adults; 150.4 (23.1) meters (Connelly & [place], 2009, Older Adults) MDC (calculated) = 12.2 meters or 40 feet (90% confidence) Justin Mend & Maisie Fus, 2009, Older Adults)  Standing Marches with BUE support  1 x 10        PATIENT EDUCATION:  Education details: form and technique for correct performance of exercise  Person educated: Patient Education method: Explanation, Demonstration, Verbal cues, and Handouts Education comprehension: verbalized understanding and returned demonstration   HOME EXERCISE PROGRAM: Access Code: [email or web address] URL: [email or web address] Date: 03/26/2023 Prepared by: Ellin Goodie  Exercises - Supine Lower Trunk Rotation  - 1 x daily - 3 sets - 10 reps - Supine Single Knee to Chest Stretch  - 1 x daily - 3 sets - 10 reps - Standing March with Counter Support  - 3 x weekly - 3 sets - 10 reps - Figure-8 Walking Around [employer]  - 1 x daily - 10 reps - 5 ft  hold - Half Tandem Stance Balance with Eyes Closed  - 1 x daily - 3 reps  - 30 sec  hold - Sit to Stand  - 3 x weekly - 3 sets - 10 reps - Standing Knee Flexion AROM with Chair Support  - 3 x weekly - 3 sets - 10 reps  ASSESSMENT:   CLINICAL IMPRESSION: Pt shows improvement with functional perception of low back along with improved LE strength and endurance. She continues to show increased right hip pain especially when walking without an AD along with notable left hip drop. Hip abduction exercise added to target strengthening glute med to decreased hip drop and improve stability of stance limb. Pt deferred using rollator for now and she would like to continue to focus on improving ambulation and endurance with SPC. She will continue to benefit from skilled therapy to address remaining deficits in order to improve quality of life and return to PLOF.      OBJECTIVE IMPAIRMENTS: Abnormal gait, decreased balance, decreased endurance, difficulty walking, decreased strength, hypomobility, impaired flexibility, and pain.    ACTIVITY LIMITATIONS: carrying, lifting, standing, and locomotion level   PARTICIPATION LIMITATIONS: cleaning, laundry, shopping, community activity, yard work, and church   PERSONAL FACTORS: Age, Past/current experiences, and Time since onset of injury/illness/exacerbation are also affecting patient's functional outcome.    REHAB POTENTIAL: Fair chronicity of condition and past failed treatents    CLINICAL DECISION MAKING: Stable/uncomplicated   EVALUATION COMPLEXITY: Low     GOALS: Goals reviewed with patient? No   SHORT TERM GOALS: Target date: 03/25/2023   Pt will be independent with HEP in order to improve strength and balance in order to decrease fall risk and improve function at home and work. Baseline: NT 04/02/23: Able to perform independently  Goal status: Achieved       LONG TERM GOALS: Target date: 05/20/2023   Patient will have improved function and activity level as evidenced by an increase in FOTO score by 10 points or more.   Baseline: 44 04/02/23: 53  Goal status: Partially Met    2.  Patient will perform five sit to stand repetitions in <=15 secs as evidence of  LE strength that shows that this community dwelling older adult that is older is at a decreased risk of falling. (Buatois 2010)   Baseline:  15 sec 04/02/23: 12 sec  Goal status: MET   3.  Patient will demonstrate improved LE endurance with completion of >=10 reps as indication of decrease risk of falling for age and gender matched norms to remain independent and avoid serious injury from falling. Baseline: 9 reps 04/02/23: 10 reps   Goal status: MET   4.  Pt will decrease 5TSTS by at least 3 seconds in order to demonstrate clinically significant improvement in LE strength Baseline: 15 sec 04/02/23: 12 sec    Goal status: MET   5.  Pt will increase by at least 12.2 m (40 ft) in order to demonstrate clinically significant improvement in cardiopulmonary endurance and community ambulation Baseline: 242 ft   Goal status: Ongoing    6.  Patient will improve hip and abdominal strength MMT by 1/3 grade MMT (ie 4- to 4) as evidence of improved spinal stability to decrease neurogenic claudication and improve walking distance without being limited by pain.   Baseline: Hip Flex R/L 4-/4-, Hip Abd R/L 3+/3+, Hip Add R/L 3+/3+ Abd 4/5  Goal status: Ongoing   7. Patient will show decreased risk of falling as evidence by decrease in self-selected gait speed by 0.12 m/sec.  Baseline: 0.73 m/sec  Goal status: Ongoing     PLAN:   PT FREQUENCY: 1-2x/week   PT DURATION: 10 weeks   PLANNED INTERVENTIONS: Therapeutic exercises, Therapeutic activity, Neuromuscular re-education, Balance training, Gait training, Joint mobilization, Joint manipulation, Stair training, Aquatic Therapy, Dry Needling, Electrical stimulation, Spinal manipulation, Spinal mobilization, Cryotherapy, Moist heat, Manual therapy, and Re-evaluation.   PLAN FOR NEXT SESSION: and 10 m walk  test.  Hip flexor strength. OTAGO exercises with walking circuits.     Ellin Goodie PT, DPT   04/02/2023, 10:34 AM

## 2023-04-04 ENCOUNTER — Encounter: Payer: Self-pay | Admitting: Physical Therapy

## 2023-04-04 ENCOUNTER — Ambulatory Visit: Payer: PPO | Admitting: Physical Therapy

## 2023-04-04 DIAGNOSIS — M5459 Other low back pain: Secondary | ICD-10-CM | POA: Diagnosis not present

## 2023-04-04 DIAGNOSIS — R262 Difficulty in walking, not elsewhere classified: Secondary | ICD-10-CM

## 2023-04-04 NOTE — Therapy (Signed)
OUTPATIENT PHYSICAL THERAPY TREATMENT NOTE   Patient Name: Brittney Ramsey MRN: [id number] DOB:Aug 01, 1946, 77 y.o., female Today's Date: 04/04/2023  PCP: Dr. Larwance Sachs  REFERRING PROVIDER: Burman Freestone FNP   END OF SESSION:   PT End of Session - 04/04/23 0955     Visit Number 7    Number of Visits 20    Date for PT Re-Evaluation 05/20/23    Authorization Type HTA [id number]    Authorization - Visit Number 7    Authorization - Number of Visits 20    Progress Note Due on Visit 10    PT Start Time 0945    PT Stop Time 1030    PT Time Calculation (min) 45 min    Activity Tolerance Patient tolerated treatment well    Behavior During Therapy WFL for tasks assessed/performed               Past Medical History:  Diagnosis Date   Allergic rhinitis    Allergy    Anxiety    Arthritis    Complication of anesthesia    Hyperlipemia    Hypertension    Neuropathy    legs   PONV (postoperative nausea and vomiting)    Past Surgical History:  Procedure Laterality Date   CATARACT EXTRACTION W/PHACO Right 01/14/2023   Procedure: CATARACT EXTRACTION PHACO AND INTRAOCULAR LENS PLACEMENT (IOC) RIGHT  20.00  01:36.9;  Surgeon: Estanislado Pandy, MD;  Location: [facility];  Service: Ophthalmology;  Laterality: Right;   CATARACT EXTRACTION W/PHACO Left 01/24/2023   Procedure: CATARACT EXTRACTION PHACO AND INTRAOCULAR LENS PLACEMENT (IOC) LEFT  10.51  00:49.3;  Surgeon: Estanislado Pandy, MD;  Location: [facility];  Service: Ophthalmology;  Laterality: Left;   COLONOSCOPY     ESOPHAGEAL MANOMETRY N/A 12/21/2015   Procedure: ESOPHAGEAL MANOMETRY (EM);  Surgeon: Elnita Maxwell, MD;  Location: [facility] END[facility]COPY;  Service: Endoscopy;  Laterality: N/A;   ESOPHAGOGASTRODUODENOSCOPY (EGD) WITH PROPOFOL N/A 11/04/2015   Procedure: ESOPHAGOGASTRODUODENOSCOPY (EGD) WITH PROPOFOL;  Surgeon: Wallace Cullens, MD;  Location: [facility] ENDOSCOPY;  Service: Endoscopy;  Laterality: N/A;   HIP  ARTHROPLASTY Right 08/03/2019   Procedure: ARTHROPLASTY BIPOLAR HIP (HEMIARTHROPLASTY);  Surgeon: Donato Heinz, MD;  Location: [facility];  Service: Orthopedics;  Laterality: Right;   L3-4, L4-5 decompression and fusion  10/20/2018   Dr. Delma Officer   TUBAL LIGATION     Patient Active Problem List   Diagnosis Date Noted   HTN (hypertension) 08/02/2019   HLD (hyperlipidemia) 08/02/2019   Hip fracture, right (HCC) 08/02/2019   Hip fracture (HCC) 08/02/2019   Spondylolisthesis of lumbar region 10/20/2018   Hyponatremia 11/02/2015    REFERRING DIAG: DDD Lumbar   THERAPY DIAG:  Other low back pain  Difficulty in walking, not elsewhere classified  Rationale for Evaluation and Treatment Rehabilitation  PERTINENT HISTORY: Pt reports that she had a prior lumbar fusion in 2019 and her lower back pain improved somewhat. However, it has been getting steadily worse and she has already received three steroid injections. She also has also gotten a partial right hip replacement shortly after the back surgery because she fell and shattered her right hip. She reports having instability especially when walking fast and going up and downstairs. She wants to be able walk longer distances to be able to retrieve mail from her mailbox.   PRECAUTIONS: Falls risk   SUBJECTIVE:  SUBJECTIVE STATEMENT: Pt states that she was able to go furniture shopping and she only felt a little knee and low back pain.      PAIN:  Are you having pain? No   OBJECTIVE: (objective measures completed at initial evaluation unless otherwise dated)  VITALS:bp 135/57 hr 81 SpO2 100   DIAGNOSTIC FINDINGS:  CLINICAL DATA:  Low back pain radiates down posterior right hip into leg   EXAM: MRI LUMBAR SPINE WITHOUT CONTRAST    TECHNIQUE: Multiplanar, multisequence MR imaging of the lumbar spine was performed. No intravenous contrast was administered.   COMPARISON:  08/10/2018 MRI lumbar spine, correlation is also made with 09/01/2019 lumbar spine radiographs   FINDINGS: Segmentation:  5 lumbar-type vertebral bodies.   Alignment: S shaped curvature of the thoracolumbar spine. Trace retrolisthesis of T12 on L1 and L1 on L2, which are new compared to prior. 5 mm retrolisthesis of L2 on L3, also new compared to prior. Decreased anterolisthesis of L3 on L4. No residual anterolisthesis is seen at L4-L5. 5 mm anterolisthesis L5 on S1 is new from the prior exam.   Vertebrae: Status post L3-L5 fusion and decompression. Susceptibility artifact from the hardware limits evaluation of the levels. Within this limitation, no acute fracture or suspicious osseous lesion.   Conus medullaris and cauda equina: Conus extends to the L2 level. Conus and cauda equina appear normal, although evaluation of the postoperative levels is limited by susceptibility artifact.   Paraspinal and other soft tissues: Atrophy of the inferior paraspinous muscles.   Disc levels:   T11-T12: Seen only on the sagittal images. Minimal disc bulge. No spinal canal stenosis or neural foraminal narrowing.   T12-L1: Trace retrolisthesis and minimal disc bulge. Mild facet arthropathy. No spinal canal stenosis or neural foraminal narrowing.   L1-L2: Trace retrolisthesis and minimal disc bulge. Mild facet arthropathy. No spinal canal stenosis or neural foraminal narrowing.   L2-L3: 5 mm retrolisthesis. Moderate facet arthropathy. Ligamentum flavum hypertrophy. Moderate spinal canal stenosis. Effacement of the lateral recesses, likely compressing the descending L3 nerve roots. Mild left neural foraminal narrowing, although evaluation of the neural foramina is somewhat limited by susceptibility artifact from adjacent hardware.   L3-L4: Status  post fusion and decompression. Grade 1 anterolisthesis with disc unroofing. No spinal canal stenosis or neural foraminal narrowing, although evaluation of the neural foramina is somewhat limited by susceptibility artifact.   L4-L5: Status post fusion and decompression. No spinal canal stenosis or definite neural foraminal narrowing, although evaluation of the left neural foramen is limited by susceptibility artifact.   L5-S1: 5 mm anterolisthesis with disc unroofing. Mild facet arthropathy. No spinal canal stenosis or neural foraminal narrowing, although evaluation of the neural foramina is somewhat limited by susceptibility artifact.   IMPRESSION: 1. Status post L3-L5 fusion and decompression, with no evidence of residual spinal canal stenosis or neural foraminal narrowing at these levels. 2. L2-L3 moderate spinal canal stenosis and mild left neural foraminal narrowing, which are new from the prior exam. Effacement of the lateral recesses at this level likely compresses the descending L3 nerve roots.     Electronically Signed   By: Wiliam Ke M.D.   On: 08/02/2022 19:12   PATIENT SURVEYS:  FOTO NT    SCREENING FOR RED FLAGS: Bowel or bladder incontinence: No Spinal tumors: No Cauda equina syndrome: No Compression fracture: No Abdominal aneurysm: No   COGNITION: Overall cognitive status: Within functional limits for tasks assessed  SENSATION: WFL   MUSCLE LENGTH: Hamstrings: Right 90 deg; Left 90 deg Thomas test: Right 0 deg; Left 0 deg   POSTURE: rounded shoulders   PALPATION: Lumbar, L1-L5 pain radiating outwards to paraspinals    LUMBAR ROM:    AROM eval  Flexion 75%  Extension 85%  Right lateral flexion 75%  Left lateral flexion 75%*  Right rotation 75%  Left rotation 75%   (Blank rows = not tested)   LOWER EXTREMITY ROM:          Active  Right 03/11/2023 Left 03/11/2023  Hip flexion 120 120  Hip extension 30 30   Hip abduction      Hip adduction      Hip internal rotation      Hip external rotation      Knee flexion 135 135  Knee extension 0 0  Ankle dorsiflexion 20 20  Ankle plantarflexion      Ankle inversion      Ankle eversion       (Blank rows = not tested)        LOWER EXTREMITY MMT:     MMT Right eval Left eval  Hip flexion 4- 4-  Hip extension NT NT  Hip abduction 3+ 3+  Hip adduction 3+ 3+  Hip internal rotation NT NT  Hip external rotation NT NT   Knee flexion 4  4  Knee extension 4+ 4+  Ankle dorsiflexion 4+ 4+  Ankle plantarflexion      Ankle inversion      Ankle eversion       (Blank rows = not tested)   LUMBAR SPECIAL TESTS:  Straight leg raise test: Positive, FABER test: Positive, and Thomas test: Positive   FUNCTIONAL TESTS:  5 times sit to stand: 15 sec  30 seconds chair stand test 9 reps    GAIT: Distance walked: 50 ft  Assistive device utilized: None Level of assistance: Complete Independence Comments: Antalgic gait on right side with decrease stand time on right and step length on  left    TODAY'S TREATMENT:                                                                                                                              DATE:   04/04/23: Nu-step seat and arms at 6 with level 2 resistance for 5 min  : 300 ft with SPC Standing Marches with BUE support 1 x 10  Standing Marches with 1 UE support 1 x 10  -Pt unable to maintain upright posture Standing Marches with BUE support #3 AW 2 x 10  -Pt reports right QL  Side Lying QL Stretch 4 x 30 sec  Standing Marches with BUE support with #3 AW 2 x 10 -Pt reports ongoing right sided low back  Supine SLR     04/02/23: Nu-step seat and arms at 6 with level 2 resistance for 5 min  5xSTS: 12 sec  30 sec  chair stands: 10 reps  FOTO: 53  Over ground ambulation 21 ft x 8  Standing Hip Abduction with BUE support 2 x 10  -Pt unable to abduct RLE without compensation with hip flexor   Seated Hip Abduction with yellow band 1 x 10  Seated Hip Abduction with red band 1 x 10  Seated Hip Abduction with green band 1 x 10  Seated Hip Abduction with blue band 1 x 10 Overground ambulation with SPC 26 ft x 8 in 2 min 34 sec  -NRPS 3/10 and lower than without using [facility]   03/28/23:  Nu-Step seat and arms at 6 with level 2 resistance for 5 min  Lower trunk rotation x 15 with 3 second hold  Supine knee to chest stretch 2 x 30 seconds each LE  Supine bridge x 10  SLR 2 x 10 each LE Modified dead bug 2 x 10 each side  TA activation with red physioball 2 x 10 with 3 second hold   Seated trunk flexion with red physioball 3 x 10 second hold  Seated row BTB 2 x 15  Standing paloff press x 15 each side, increase in pain in standing      PATIENT EDUCATION:  Education details: form and technique for correct performance of exercise  Person educated: Patient Education method: Explanation, Demonstration, Verbal cues, and Handouts Education comprehension: verbalized understanding and returned demonstration   HOME EXERCISE PROGRAM: Access Code: [email or web address] URL: [email or web address] Date: 04/04/2023 Prepared by: Ellin Goodie  Exercises - Supine Lower Trunk Rotation  - 1 x daily - 3 sets - 10 reps - Supine Single Knee to Chest Stretch  - 1 x daily - 3 sets - 10 reps - Figure-8 Walking Around [place]  - 1 x daily - 10 reps - 5 ft  hold - Half Tandem Stance Balance with Eyes Closed  - 1 x daily - 3 reps - 30 sec  hold - Sit to Stand  - 3 x weekly - 3 sets - 10 reps - Standing Knee Flexion AROM with Chair Support  - 3 x weekly - 3 sets - 10 reps - Seated Hip Abduction with Resistance  - 3-4 x weekly - 3 sets - 10 reps - Seated March  - 3-4 x weekly - 3 sets - 10 reps  ASSESSMENT:   CLINICAL IMPRESSION: Despite improvement with aerobic endurance as evidence by improvement in , pt continues to struggle with right hip flexion exercises and movement. 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 PT to modified HEP to include non-weight bearing position for right hip flexion to avoid pain exacerbation, and she was able to better perform seated hip flexion exercise. She will continue to benefit from skilled therapy to address remaining deficits in order to improve quality of life and return to PLOF.    OBJECTIVE IMPAIRMENTS: Abnormal gait, decreased balance, decreased endurance, difficulty walking, decreased strength, hypomobility, impaired flexibility, and pain.    ACTIVITY LIMITATIONS: carrying, lifting, standing, and locomotion level   PARTICIPATION LIMITATIONS: cleaning, laundry, shopping, community activity, yard work, and church   PERSONAL FACTORS: Age, Past/current experiences, and Time since onset of injury/illness/exacerbation are also affecting patient's functional outcome.    REHAB POTENTIAL: Fair chronicity of condition and past failed treatents    CLINICAL DECISION MAKING: Stable/uncomplicated   EVALUATION COMPLEXITY: Low     GOALS: Goals reviewed with patient? No   SHORT TERM GOALS: Target date: 03/25/2023   Pt will  be independent with HEP in order to improve strength and balance in order to decrease fall risk and improve function at home and work. Baseline: NT 04/02/23: Able to perform independently  Goal status: Achieved       LONG TERM GOALS: Target date: 05/20/2023   Patient will have improved function and activity level as evidenced by an increase in FOTO score by 10 points or more.  Baseline: 44 04/02/23: 53  Goal status: Partially Met    2.  Patient will perform five sit to stand repetitions in <=15 secs as evidence of LE strength that shows that this community dwelling older adult that is older is at a decreased risk of falling. (Buatois 2010)   Baseline:  15 sec 04/02/23: 12 sec  Goal status: MET   3.  Patient will demonstrate improved LE endurance with completion of >=10 reps as indication of decrease  risk of falling for age and gender matched norms to remain independent and avoid serious injury from falling. Baseline: 9 reps 04/02/23: 10 reps   Goal status: MET   4.  Pt will decrease 5TSTS by at least 3 seconds in order to demonstrate clinically significant improvement in LE strength Baseline: 15 sec 04/02/23: 12 sec    Goal status: MET   5.  Pt will increase by at least 12.2 m (40 ft) in order to demonstrate clinically significant improvement in cardiopulmonary endurance and community ambulation Baseline: 242 ft  04/04/23: 300 ft  Goal status: MET    6.  Patient will improve hip and abdominal strength MMT by 1/3 grade MMT (ie 4- to 4) as evidence of improved spinal stability to decrease neurogenic claudication and improve walking distance without being limited by pain.   Baseline: Hip Flex R/L 4-/4-, Hip Abd R/L 3+/3+, Hip Add R/L 3+/3+, Hip Ext 4+/4+  Goal status: Ongoing   7. Patient will show decreased risk of falling as evidence by decrease in self-selected gait speed by 0.12 m/sec.  Baseline: 0.73 m/sec  Goal status: Ongoing     PLAN:   PT FREQUENCY: 1-2x/week   PT DURATION: 10 weeks   PLANNED INTERVENTIONS: Therapeutic exercises, Therapeutic activity, Neuromuscular re-education, Balance training, Gait training, Joint mobilization, Joint manipulation, Stair training, Aquatic Therapy, Dry Needling, Electrical stimulation, Spinal manipulation, Spinal mobilization, Cryotherapy, Moist heat, Manual therapy, and Re-evaluation.   PLAN FOR NEXT SESSION: Hip strengthening and ambulation circuits: Walk 2 laps followed by standing HS curl, walk 2 laps followed by 10 sit to stands, etc. Progress hip abduction (standing aggravates low back pain)    Ellin Goodie PT, DPT   04/04/2023, 9:56 AM

## 2023-04-09 ENCOUNTER — Ambulatory Visit: Payer: PPO

## 2023-04-09 ENCOUNTER — Encounter: Payer: Self-pay | Admitting: Physical Therapy

## 2023-04-09 DIAGNOSIS — M5459 Other low back pain: Secondary | ICD-10-CM

## 2023-04-09 DIAGNOSIS — R262 Difficulty in walking, not elsewhere classified: Secondary | ICD-10-CM

## 2023-04-09 NOTE — Therapy (Signed)
OUTPATIENT PHYSICAL THERAPY TREATMENT NOTE   Patient Name: Brittney Ramsey MRN: [id number] DOB:[birth year], 77 y.o., female Today's Date: 04/09/2023  PCP: Dr. Larwance Sachs  REFERRING PROVIDER: Burman Freestone FNP   END OF SESSION:   PT End of Session - 04/09/23 1026     Visit Number 8    Number of Visits 20    Date for PT Re-Evaluation 05/20/23    Authorization Type HTA [id number]    Authorization - Visit Number 8    Authorization - Number of Visits 20    Progress Note Due on Visit 10    PT Start Time 1030    PT Stop Time 1113    PT Time Calculation (min) 43 min    Activity Tolerance Patient tolerated treatment well    Behavior During Therapy WFL for tasks assessed/performed                Past Medical History:  Diagnosis Date   Allergic rhinitis    Allergy    Anxiety    Arthritis    Complication of anesthesia    Hyperlipemia    Hypertension    Neuropathy    legs   PONV (postoperative nausea and vomiting)    Past Surgical History:  Procedure Laterality Date   CATARACT EXTRACTION W/PHACO Right 01/14/2023   Procedure: CATARACT EXTRACTION PHACO AND INTRAOCULAR LENS PLACEMENT (IOC) RIGHT  20.00  01:36.9;  Surgeon: Estanislado Pandy, MD;  Location: [facility];  Service: Ophthalmology;  Laterality: Right;   CATARACT EXTRACTION W/PHACO Left 01/24/2023   Procedure: CATARACT EXTRACTION PHACO AND INTRAOCULAR LENS PLACEMENT (IOC) LEFT  10.51  00:49.3;  Surgeon: Estanislado Pandy, MD;  Location: [facility];  Service: Ophthalmology;  Laterality: Left;   COLONOSCOPY     ESOPHAGEAL MANOMETRY N/A 12/21/2015   Procedure: ESOPHAGEAL MANOMETRY (EM);  Surgeon: Elnita Maxwell, MD;  Location: [facility];  Service: Endoscopy;  Laterality: N/A;   ESOPHAGOGASTRODUODENOSCOPY (EGD) WITH PROPOFOL N/A 11/04/2015   Procedure: ESOPHAGOGASTRODUODENOSCOPY (EGD) WITH PROPOFOL;  Surgeon: Wallace Cullens, MD;  Location: [facility] END[facility];  Service: Endoscopy;  Laterality: N/A;   HIP  ARTHROPLASTY Right 08/03/2019   Procedure: ARTHROPLASTY BIPOLAR HIP (HEMIARTHROPLASTY);  Surgeon: Donato Heinz, MD;  Location: [facility];  Service: Orthopedics;  Laterality: Right;   L3-4, L4-5 decompression and fusion  10/20/2018   Dr. Delma Officer   TUBAL LIGATION     Patient Active Problem List   Diagnosis Date Noted   HTN (hypertension) 08/02/2019   HLD (hyperlipidemia) 08/02/2019   Hip fracture, right (HCC) 08/02/2019   Hip fracture (HCC) 08/02/2019   Spondylolisthesis of lumbar region 10/20/2018   Hyponatremia 11/02/2015    REFERRING DIAG: DDD Lumbar   THERAPY DIAG:  Other low back pain  Difficulty in walking, not elsewhere classified  Rationale for Evaluation and Treatment Rehabilitation  PERTINENT HISTORY: Pt reports that she had a prior lumbar fusion in 2019 and her lower back pain improved somewhat. However, it has been getting steadily worse and she has already received three steroid injections. She also has also gotten a partial right hip replacement shortly after the back surgery because she fell and shattered her right hip. She reports having instability especially when walking fast and going up and downstairs. She wants to be able walk longer distances to be able to retrieve mail from her mailbox.   PRECAUTIONS: Falls risk   SUBJECTIVE:  SUBJECTIVE STATEMENT: Patient reports back feeling good and not in much pain this AM.     PAIN:  Are you having pain? No   OBJECTIVE: (objective measures completed at initial evaluation unless otherwise dated)  VITALS:bp 135/57 hr 81 SpO2 100   DIAGNOSTIC FINDINGS:  CLINICAL DATA:  Low back pain radiates down posterior right hip into leg   EXAM: MRI LUMBAR SPINE WITHOUT CONTRAST   TECHNIQUE: Multiplanar, multisequence MR imaging of the  lumbar spine was performed. No intravenous contrast was administered.   COMPARISON:  08/10/2018 MRI lumbar spine, correlation is also made with 09/01/2019 lumbar spine radiographs   FINDINGS: Segmentation:  5 lumbar-type vertebral bodies.   Alignment: S shaped curvature of the thoracolumbar spine. Trace retrolisthesis of T12 on L1 and L1 on L2, which are new compared to prior. 5 mm retrolisthesis of L2 on L3, also new compared to prior. Decreased anterolisthesis of L3 on L4. No residual anterolisthesis is seen at L4-L5. 5 mm anterolisthesis L5 on S1 is new from the prior exam.   Vertebrae: Status post L3-L5 fusion and decompression. Susceptibility artifact from the hardware limits evaluation of the levels. Within this limitation, no acute fracture or suspicious osseous lesion.   Conus medullaris and cauda equina: Conus extends to the L2 level. Conus and cauda equina appear normal, although evaluation of the postoperative levels is limited by susceptibility artifact.   Paraspinal and other soft tissues: Atrophy of the inferior paraspinous muscles.   Disc levels:   T11-T12: Seen only on the sagittal images. Minimal disc bulge. No spinal canal stenosis or neural foraminal narrowing.   T12-L1: Trace retrolisthesis and minimal disc bulge. Mild facet arthropathy. No spinal canal stenosis or neural foraminal narrowing.   L1-L2: Trace retrolisthesis and minimal disc bulge. Mild facet arthropathy. No spinal canal stenosis or neural foraminal narrowing.   L2-L3: 5 mm retrolisthesis. Moderate facet arthropathy. Ligamentum flavum hypertrophy. Moderate spinal canal stenosis. Effacement of the lateral recesses, likely compressing the descending L3 nerve roots. Mild left neural foraminal narrowing, although evaluation of the neural foramina is somewhat limited by susceptibility artifact from adjacent hardware.   L3-L4: Status post fusion and decompression. Grade 1 anterolisthesis with  disc unroofing. No spinal canal stenosis or neural foraminal narrowing, although evaluation of the neural foramina is somewhat limited by susceptibility artifact.   L4-L5: Status post fusion and decompression. No spinal canal stenosis or definite neural foraminal narrowing, although evaluation of the left neural foramen is limited by susceptibility artifact.   L5-S1: 5 mm anterolisthesis with disc unroofing. Mild facet arthropathy. No spinal canal stenosis or neural foraminal narrowing, although evaluation of the neural foramina is somewhat limited by susceptibility artifact.   IMPRESSION: 1. Status post L3-L5 fusion and decompression, with no evidence of residual spinal canal stenosis or neural foraminal narrowing at these levels. 2. L2-L3 moderate spinal canal stenosis and mild left neural foraminal narrowing, which are new from the prior exam. Effacement of the lateral recesses at this level likely compresses the descending L3 nerve roots.     Electronically Signed   By: Wiliam Ke M.D.   On: 08/02/2022 19:12   PATIENT SURVEYS:  FOTO NT    SCREENING FOR RED FLAGS: Bowel or bladder incontinence: No Spinal tumors: No Cauda equina syndrome: No Compression fracture: No Abdominal aneurysm: No   COGNITION: Overall cognitive status: Within functional limits for tasks assessed  SENSATION: WFL   MUSCLE LENGTH: Hamstrings: Right 90 deg; Left 90 deg Thomas test: Right 0 deg; Left 0 deg   POSTURE: rounded shoulders   PALPATION: Lumbar, L1-L5 pain radiating outwards to paraspinals    LUMBAR ROM:    AROM eval  Flexion 75%  Extension 85%  Right lateral flexion 75%  Left lateral flexion 75%*  Right rotation 75%  Left rotation 75%   (Blank rows = not tested)   LOWER EXTREMITY ROM:          Active  Right 03/11/2023 Left 03/11/2023  Hip flexion 120 120  Hip extension 30 30  Hip abduction      Hip adduction      Hip internal rotation       Hip external rotation      Knee flexion 135 135  Knee extension 0 0  Ankle dorsiflexion 20 20  Ankle plantarflexion      Ankle inversion      Ankle eversion       (Blank rows = not tested)        LOWER EXTREMITY MMT:     MMT Right eval Left eval  Hip flexion 4- 4-  Hip extension NT NT  Hip abduction 3+ 3+  Hip adduction 3+ 3+  Hip internal rotation NT NT  Hip external rotation NT NT   Knee flexion 4  4  Knee extension 4+ 4+  Ankle dorsiflexion 4+ 4+  Ankle plantarflexion      Ankle inversion      Ankle eversion       (Blank rows = not tested)   LUMBAR SPECIAL TESTS:  Straight leg raise test: Positive, FABER test: Positive, and Thomas test: Positive   FUNCTIONAL TESTS:  5 times sit to stand: 15 sec  30 seconds chair stand test 9 reps    GAIT: Distance walked: 50 ft  Assistive device utilized: None Level of assistance: Complete Independence Comments: Antalgic gait on right side with decrease stand time on right and step length on  left    TODAY'S TREATMENT:                                                                                                                              DATE:   04/09/23: Nu-step seat and arms at 6 with level 3 resistance for 5 min  Ambulation circuit 200' with SPC between each exercise listed below (patient required seated rest break between each bout):  -Standing hamstring curls x 10, each LE  -Sit to stand x 10, no UE support   -Seated marching x 10, each LE   -Standing hip abduction x 10, each LE, B UE support  Seated hip abduction with BTB 3 x 15, each LE  Seated marching with BTB 3 x 10, each LE Seated LAQ with 3# AW 3 x 10, each LE   04/04/23: Nu-step seat and arms at 6 with level 2 resistance for 5  min  : 300 ft with SPC Standing Marches with BUE support 1 x 10  Standing Marches with 1 UE support 1 x 10  -Pt unable to maintain upright posture Standing Marches with BUE support #3 AW 2 x 10  -Pt reports right QL   Side Lying QL Stretch 4 x 30 sec  Standing Marches with BUE support with #3 AW 2 x 10 -Pt reports ongoing right sided low back  Supine SLR   04/02/23: Nu-step seat and arms at 6 with level 2 resistance for 5 min  5xSTS: 12 sec  30 sec chair stands: 10 reps  FOTO: 53  Over ground ambulation 21 ft x 8  Standing Hip Abduction with BUE support 2 x 10  -Pt unable to abduct RLE without compensation with hip flexor  Seated Hip Abduction with yellow band 1 x 10  Seated Hip Abduction with red band 1 x 10  Seated Hip Abduction with green band 1 x 10  Seated Hip Abduction with blue band 1 x 10 Overground ambulation with SPC 26 ft x 8 in 2 min 34 sec  -NRPS 3/10 and lower than without using [facility]     PATIENT EDUCATION:  Education details: form and technique for correct performance of exercise  Person educated: Patient Education method: Explanation, Demonstration, Verbal cues, and Handouts Education comprehension: verbalized understanding and returned demonstration   HOME EXERCISE PROGRAM: Access Code: [email or web address] URL: [email or web address] Date: 04/04/2023 Prepared by: Ellin Goodie  Exercises - Supine Lower Trunk Rotation  - 1 x daily - 3 sets - 10 reps - Supine Single Knee to Chest Stretch  - 1 x daily - 3 sets - 10 reps - Figure-8 Walking Around [place]  - 1 x daily - 10 reps - 5 ft  hold - Half Tandem Stance Balance with Eyes Closed  - 1 x daily - 3 reps - 30 sec  hold - Sit to Stand  - 3 x weekly - 3 sets - 10 reps - Standing Knee Flexion AROM with Chair Support  - 3 x weekly - 3 sets - 10 reps - Seated Hip Abduction with Resistance  - 3-4 x weekly - 3 sets - 10 reps - Seated March  - 3-4 x weekly - 3 sets - 10 reps  ASSESSMENT:   CLINICAL IMPRESSION: Patient arrives to session with [facility] and reports of little to no pain. Patient continues to show improvement in endurance but reports SOB with activity. Patient responded well with ambulation circuits with hip strengthening  exercise between ambulation bouts. Required seated rest breaks between each circuit. Patient will continue to benefit from skilled therapy to address remaining deficits in order to improve quality of life and return to PLOF.    OBJECTIVE IMPAIRMENTS: Abnormal gait, decreased balance, decreased endurance, difficulty walking, decreased strength, hypomobility, impaired flexibility, and pain.    ACTIVITY LIMITATIONS: carrying, lifting, standing, and locomotion level   PARTICIPATION LIMITATIONS: cleaning, laundry, shopping, community activity, yard work, and church   PERSONAL FACTORS: Age, Past/current experiences, and Time since onset of injury/illness/exacerbation are also affecting patient's functional outcome.    REHAB POTENTIAL: Fair chronicity of condition and past failed treatents    CLINICAL DECISION MAKING: Stable/uncomplicated   EVALUATION COMPLEXITY: Low     GOALS: Goals reviewed with patient? No   SHORT TERM GOALS: Target date: 03/25/2023   Pt will be independent with HEP in order to improve strength and balance in order to decrease fall risk and improve function at home and  work. Baseline: NT 04/02/23: Able to perform independently  Goal status: Achieved       LONG TERM GOALS: Target date: 05/20/2023   Patient will have improved function and activity level as evidenced by an increase in FOTO score by 10 points or more.  Baseline: 44 04/02/23: 53  Goal status: Partially Met    2.  Patient will perform five sit to stand repetitions in <=15 secs as evidence of LE strength that shows that this community dwelling older adult that is older is at a decreased risk of falling. (Buatois 2010)   Baseline:  15 sec 04/02/23: 12 sec  Goal status: MET   3.  Patient will demonstrate improved LE endurance with completion of >=10 reps as indication of decrease risk of falling for age and gender matched norms to remain independent and avoid serious injury from falling. Baseline: 9 reps 04/02/23:  10 reps   Goal status: MET   4.  Pt will decrease 5TSTS by at least 3 seconds in order to demonstrate clinically significant improvement in LE strength Baseline: 15 sec 04/02/23: 12 sec    Goal status: MET   5.  Pt will increase by at least 12.2 m (40 ft) in order to demonstrate clinically significant improvement in cardiopulmonary endurance and community ambulation Baseline: 242 ft  04/04/23: 300 ft  Goal status: MET    6.  Patient will improve hip and abdominal strength MMT by 1/3 grade MMT (ie 4- to 4) as evidence of improved spinal stability to decrease neurogenic claudication and improve walking distance without being limited by pain.   Baseline: Hip Flex R/L 4-/4-, Hip Abd R/L 3+/3+, Hip Add R/L 3+/3+, Hip Ext 4+/4+  Goal status: Ongoing   7. Patient will show decreased risk of falling as evidence by decrease in self-selected gait speed by 0.12 m/sec.  Baseline: 0.73 m/sec  Goal status: Ongoing     PLAN:   PT FREQUENCY: 1-2x/week   PT DURATION: 10 weeks   PLANNED INTERVENTIONS: Therapeutic exercises, Therapeutic activity, Neuromuscular re-education, Balance training, Gait training, Joint mobilization, Joint manipulation, Stair training, Aquatic Therapy, Dry Needling, Electrical stimulation, Spinal manipulation, Spinal mobilization, Cryotherapy, Moist heat, Manual therapy, and Re-evaluation.   PLAN FOR NEXT SESSION:Hip strengthening and ambulation circuits: Walk 2 laps followed by standing HS curl, walk 2 laps followed by 10 sit to stands, etc. Progress hip abduction (standing aggravates low back pain)    Maylon Peppers, PT, DPT Physical Therapist - [facility]  [facility]    04/09/2023, 11:14 AM

## 2023-04-10 NOTE — Therapy (Signed)
OUTPATIENT PHYSICAL THERAPY TREATMENT NOTE   Patient Name: Brittney Ramsey MRN: [id number] DOB:03-16-1946, 77 y.o., female Today's Date: 04/11/2023  PCP: Dr. Larwance Sachs  REFERRING PROVIDER: Burman Freestone FNP   END OF SESSION:   PT End of Session - 04/11/23 0941     Visit Number 9    Number of Visits 20    Date for PT Re-Evaluation 05/20/23    Authorization Type HTA [id number]    Authorization - Visit Number 9    Authorization - Number of Visits 20    Progress Note Due on Visit 10    PT Start Time 0945    PT Stop Time 1029    PT Time Calculation (min) 44 min    Activity Tolerance Patient tolerated treatment well    Behavior During Therapy WFL for tasks assessed/performed               Past Medical History:  Diagnosis Date   Allergic rhinitis    Allergy    Anxiety    Arthritis    Complication of anesthesia    Hyperlipemia    Hypertension    Neuropathy    legs   PONV (postoperative nausea and vomiting)    Past Surgical History:  Procedure Laterality Date   CATARACT EXTRACTION W/PHACO Right 01/14/2023   Procedure: CATARACT EXTRACTION PHACO AND INTRAOCULAR LENS PLACEMENT (IOC) RIGHT  20.00  01:36.9;  Surgeon: Estanislado Pandy, MD;  Location: [facility];  Service: Ophthalmology;  Laterality: Right;   CATARACT EXTRACTION W/PHACO Left 01/24/2023   Procedure: CATARACT EXTRACTION PHACO AND INTRAOCULAR LENS PLACEMENT (IOC) LEFT  10.51  00:49.3;  Surgeon: Estanislado Pandy, MD;  Location: [facility];  Service: Ophthalmology;  Laterality: Left;   COLONOSCOPY     ESOPHAGEAL MANOMETRY N/A 12/21/2015   Procedure: ESOPHAGEAL MANOMETRY (EM);  Surgeon: Elnita Maxwell, MD;  Location: [facility];  Service: Endoscopy;  Laterality: N/A;   ESOPHAGOGASTRODUODENOSCOPY (EGD) WITH PROPOFOL N/A 11/04/2015   Procedure: ESOPHAGOGASTRODUODENOSCOPY (EGD) WITH PROPOFOL;  Surgeon: Wallace Cullens, MD;  Location: [facility];  Service: Endoscopy;  Laterality: N/A;   HIP  ARTHROPLASTY Right 08/03/2019   Procedure: ARTHROPLASTY BIPOLAR HIP (HEMIARTHROPLASTY);  Surgeon: Donato Heinz, MD;  Location: [facility];  Service: Orthopedics;  Laterality: Right;   L3-4, L4-5 decompression and fusion  10/20/2018   Dr. Delma Officer   TUBAL LIGATION     Patient Active Problem List   Diagnosis Date Noted   HTN (hypertension) 08/02/2019   HLD (hyperlipidemia) 08/02/2019   Hip fracture, right (HCC) 08/02/2019   Hip fracture (HCC) 08/02/2019   Spondylolisthesis of lumbar region 10/20/2018   Hyponatremia 11/02/2015    REFERRING DIAG: DDD Lumbar   THERAPY DIAG:  Difficulty in walking, not elsewhere classified  Other low back pain  Rationale for Evaluation and Treatment Rehabilitation  PERTINENT HISTORY: Pt reports that she had a prior lumbar fusion in 2019 and her lower back pain improved somewhat. However, it has been getting steadily worse and she has already received three steroid injections. She also has also gotten a partial right hip replacement shortly after the back surgery because she fell and shattered her right hip. She reports having instability especially when walking fast and going up and downstairs. She wants to be able walk longer distances to be able to retrieve mail from her mailbox.   PRECAUTIONS: Falls risk   SUBJECTIVE:  SUBJECTIVE STATEMENT: Patient reports no pain on arrival to therapy session. No significant changes since last session.    PAIN:  Are you having pain? No   OBJECTIVE: (objective measures completed at initial evaluation unless otherwise dated)  VITALS:bp 135/57 hr 81 SpO2 100   DIAGNOSTIC FINDINGS:  CLINICAL DATA:  Low back pain radiates down posterior right hip into leg   EXAM: MRI LUMBAR SPINE WITHOUT CONTRAST   TECHNIQUE: Multiplanar,  multisequence MR imaging of the lumbar spine was performed. No intravenous contrast was administered.   COMPARISON:  08/10/2018 MRI lumbar spine, correlation is also made with 09/01/2019 lumbar spine radiographs   FINDINGS: Segmentation:  5 lumbar-type vertebral bodies.   Alignment: S shaped curvature of the thoracolumbar spine. Trace retrolisthesis of T12 on L1 and L1 on L2, which are new compared to prior. 5 mm retrolisthesis of L2 on L3, also new compared to prior. Decreased anterolisthesis of L3 on L4. No residual anterolisthesis is seen at L4-L5. 5 mm anterolisthesis L5 on S1 is new from the prior exam.   Vertebrae: Status post L3-L5 fusion and decompression. Susceptibility artifact from the hardware limits evaluation of the levels. Within this limitation, no acute fracture or suspicious osseous lesion.   Conus medullaris and cauda equina: Conus extends to the L2 level. Conus and cauda equina appear normal, although evaluation of the postoperative levels is limited by susceptibility artifact.   Paraspinal and other soft tissues: Atrophy of the inferior paraspinous muscles.   Disc levels:   T11-T12: Seen only on the sagittal images. Minimal disc bulge. No spinal canal stenosis or neural foraminal narrowing.   T12-L1: Trace retrolisthesis and minimal disc bulge. Mild facet arthropathy. No spinal canal stenosis or neural foraminal narrowing.   L1-L2: Trace retrolisthesis and minimal disc bulge. Mild facet arthropathy. No spinal canal stenosis or neural foraminal narrowing.   L2-L3: 5 mm retrolisthesis. Moderate facet arthropathy. Ligamentum flavum hypertrophy. Moderate spinal canal stenosis. Effacement of the lateral recesses, likely compressing the descending L3 nerve roots. Mild left neural foraminal narrowing, although evaluation of the neural foramina is somewhat limited by susceptibility artifact from adjacent hardware.   L3-L4: Status post fusion and  decompression. Grade 1 anterolisthesis with disc unroofing. No spinal canal stenosis or neural foraminal narrowing, although evaluation of the neural foramina is somewhat limited by susceptibility artifact.   L4-L5: Status post fusion and decompression. No spinal canal stenosis or definite neural foraminal narrowing, although evaluation of the left neural foramen is limited by susceptibility artifact.   L5-S1: 5 mm anterolisthesis with disc unroofing. Mild facet arthropathy. No spinal canal stenosis or neural foraminal narrowing, although evaluation of the neural foramina is somewhat limited by susceptibility artifact.   IMPRESSION: 1. Status post L3-L5 fusion and decompression, with no evidence of residual spinal canal stenosis or neural foraminal narrowing at these levels. 2. L2-L3 moderate spinal canal stenosis and mild left neural foraminal narrowing, which are new from the prior exam. Effacement of the lateral recesses at this level likely compresses the descending L3 nerve roots.     Electronically Signed   By: Wiliam Ke M.D.   On: 08/02/2022 19:12   PATIENT SURVEYS:  FOTO NT    SCREENING FOR RED FLAGS: Bowel or bladder incontinence: No Spinal tumors: No Cauda equina syndrome: No Compression fracture: No Abdominal aneurysm: No   COGNITION: Overall cognitive status: Within functional limits for tasks assessed  SENSATION: WFL   MUSCLE LENGTH: Hamstrings: Right 90 deg; Left 90 deg Thomas test: Right 0 deg; Left 0 deg   POSTURE: rounded shoulders   PALPATION: Lumbar, L1-L5 pain radiating outwards to paraspinals    LUMBAR ROM:    AROM eval  Flexion 75%  Extension 85%  Right lateral flexion 75%  Left lateral flexion 75%*  Right rotation 75%  Left rotation 75%   (Blank rows = not tested)   LOWER EXTREMITY ROM:          Active  Right 03/11/2023 Left 03/11/2023  Hip flexion 120 120  Hip extension 30 30  Hip abduction       Hip adduction      Hip internal rotation      Hip external rotation      Knee flexion 135 135  Knee extension 0 0  Ankle dorsiflexion 20 20  Ankle plantarflexion      Ankle inversion      Ankle eversion       (Blank rows = not tested)        LOWER EXTREMITY MMT:     MMT Right eval Left eval  Hip flexion 4- 4-  Hip extension NT NT  Hip abduction 3+ 3+  Hip adduction 3+ 3+  Hip internal rotation NT NT  Hip external rotation NT NT   Knee flexion 4  4  Knee extension 4+ 4+  Ankle dorsiflexion 4+ 4+  Ankle plantarflexion      Ankle inversion      Ankle eversion       (Blank rows = not tested)   LUMBAR SPECIAL TESTS:  Straight leg raise test: Positive, FABER test: Positive, and Thomas test: Positive   FUNCTIONAL TESTS:  5 times sit to stand: 15 sec  30 seconds chair stand test 9 reps    GAIT: Distance walked: 50 ft  Assistive device utilized: None Level of assistance: Complete Independence Comments: Antalgic gait on right side with decrease stand time on right and step length on  left    TODAY'S TREATMENT:                                                                                                                              DATE:  04/11/23: Nu-step seat and arms at 6 with level 3 resistance for 5 min  Sit to stand 2 x 10, no UE support Seated marching 3# AW 3 x 10, each LE  Standing marching 3# AW 3x10, each LE, B UE support Seated LAQs 3# AW 3 x 10, each LE  Standing hip abduction 3# AW 3 x 10, each LE, B UE support Seated hip adduction with ball squeeze 3 x 10  Seated heel/toe raises 3 x 10, each direction on each LE Standing hamstring curls 3# AW 3 x 10, each LE, B UE support  Seated step outs over blue TB on floor with 3# AW 3 x 10 each  LE  Paloff press with BTB 3 x 15 each side   04/09/23: Nu-step seat and arms at 6 with level 3 resistance for 5 min  Ambulation circuit 200' with SPC between each exercise listed below (patient required seated rest  break between each bout):  -Standing hamstring curls x 10, each LE  -Sit to stand x 10, no UE support   -Seated marching x 10, each LE   -Standing hip abduction x 10, each LE, B UE support  Seated hip abduction with BTB 3 x 15, each LE  Seated marching with BTB 3 x 10, each LE Seated LAQ with 3# AW 3 x 10, each LE   04/04/23: Nu-step seat and arms at 6 with level 2 resistance for 5 min  : 300 ft with SPC Standing Marches with BUE support 1 x 10  Standing Marches with 1 UE support 1 x 10  -Pt unable to maintain upright posture Standing Marches with BUE support #3 AW 2 x 10  -Pt reports right QL  Side Lying QL Stretch 4 x 30 sec  Standing Marches with BUE support with #3 AW 2 x 10 -Pt reports ongoing right sided low back  Supine SLR   04/02/23: Nu-step seat and arms at 6 with level 2 resistance for 5 min  5xSTS: 12 sec  30 sec chair stands: 10 reps  FOTO: 53  Over ground ambulation 21 ft x 8  Standing Hip Abduction with BUE support 2 x 10  -Pt unable to abduct RLE without compensation with hip flexor  Seated Hip Abduction with yellow band 1 x 10  Seated Hip Abduction with red band 1 x 10  Seated Hip Abduction with green band 1 x 10  Seated Hip Abduction with blue band 1 x 10 Overground ambulation with SPC 26 ft x 8 in 2 min 34 sec  -NRPS 3/10 and lower than without using [facility]     PATIENT EDUCATION:  Education details: form and technique for correct performance of exercise  Person educated: Patient Education method: Explanation, Demonstration, Verbal cues, and Handouts Education comprehension: verbalized understanding and returned demonstration   HOME EXERCISE PROGRAM: Access Code: [email or web address] URL: [email or web address] Date: 04/04/2023 Prepared by: Ellin Goodie  Exercises - Supine Lower Trunk Rotation  - 1 x daily - 3 sets - 10 reps - Supine Single Knee to Chest Stretch  - 1 x daily - 3 sets - 10 reps - Figure-8 Walking Around [place]  - 1 x daily - 10  reps - 5 ft  hold - Half Tandem Stance Balance with Eyes Closed  - 1 x daily - 3 reps - 30 sec  hold - Sit to Stand  - 3 x weekly - 3 sets - 10 reps - Standing Knee Flexion AROM with Chair Support  - 3 x weekly - 3 sets - 10 reps - Seated Hip Abduction with Resistance  - 3-4 x weekly - 3 sets - 10 reps - Seated March  - 3-4 x weekly - 3 sets - 10 reps  ASSESSMENT:   CLINICAL IMPRESSION:  Patient continues to demonstrate decreased B hip strength and decreased activity tolerance. Session focused on seated and standing hip strengthening exercises to limit exacerbation of back pain in standing. Requires seated rest breaks after standing exercises. Patient will continue to benefit from skilled therapy to address remaining deficits in order to improve quality of life and return to PLOF.    OBJECTIVE IMPAIRMENTS: Abnormal gait, decreased balance, decreased endurance, difficulty  walking, decreased strength, hypomobility, impaired flexibility, and pain.    ACTIVITY LIMITATIONS: carrying, lifting, standing, and locomotion level   PARTICIPATION LIMITATIONS: cleaning, laundry, shopping, community activity, yard work, and church   PERSONAL FACTORS: Age, Past/current experiences, and Time since onset of injury/illness/exacerbation are also affecting patient's functional outcome.    REHAB POTENTIAL: Fair chronicity of condition and past failed treatents    CLINICAL DECISION MAKING: Stable/uncomplicated   EVALUATION COMPLEXITY: Low     GOALS: Goals reviewed with patient? No   SHORT TERM GOALS: Target date: 03/25/2023   Pt will be independent with HEP in order to improve strength and balance in order to decrease fall risk and improve function at home and work. Baseline: NT 04/02/23: Able to perform independently  Goal status: Achieved       LONG TERM GOALS: Target date: 05/20/2023   Patient will have improved function and activity level as evidenced by an increase in FOTO score by 10 points or  more.  Baseline: 44 04/02/23: 53  Goal status: Partially Met    2.  Patient will perform five sit to stand repetitions in <=15 secs as evidence of LE strength that shows that this community dwelling older adult that is older is at a decreased risk of falling. (Buatois 2010)   Baseline:  15 sec 04/02/23: 12 sec  Goal status: MET   3.  Patient will demonstrate improved LE endurance with completion of >=10 reps as indication of decrease risk of falling for age and gender matched norms to remain independent and avoid serious injury from falling. Baseline: 9 reps 04/02/23: 10 reps   Goal status: MET   4.  Pt will decrease 5TSTS by at least 3 seconds in order to demonstrate clinically significant improvement in LE strength Baseline: 15 sec 04/02/23: 12 sec    Goal status: MET   5.  Pt will increase by at least 12.2 m (40 ft) in order to demonstrate clinically significant improvement in cardiopulmonary endurance and community ambulation Baseline: 242 ft  04/04/23: 300 ft  Goal status: MET    6.  Patient will improve hip and abdominal strength MMT by 1/3 grade MMT (ie 4- to 4) as evidence of improved spinal stability to decrease neurogenic claudication and improve walking distance without being limited by pain.   Baseline: Hip Flex R/L 4-/4-, Hip Abd R/L 3+/3+, Hip Add R/L 3+/3+, Hip Ext 4+/4+  Goal status: Ongoing   7. Patient will show decreased risk of falling as evidence by decrease in self-selected gait speed by 0.12 m/sec.  Baseline: 0.73 m/sec  Goal status: Ongoing     PLAN:   PT FREQUENCY: 1-2x/week   PT DURATION: 10 weeks   PLANNED INTERVENTIONS: Therapeutic exercises, Therapeutic activity, Neuromuscular re-education, Balance training, Gait training, Joint mobilization, Joint manipulation, Stair training, Aquatic Therapy, Dry Needling, Electrical stimulation, Spinal manipulation, Spinal mobilization, Cryotherapy, Moist heat, Manual therapy, and Re-evaluation.   PLAN FOR NEXT  SESSION: FOTO, Progress note/reassess goals, progress hip strengthening   Maylon Peppers, PT, DPT Physical Therapist -   [facility]    04/11/2023, 12:25 PM

## 2023-04-11 ENCOUNTER — Ambulatory Visit: Payer: PPO

## 2023-04-11 ENCOUNTER — Encounter: Payer: Self-pay | Admitting: Physical Therapy

## 2023-04-11 DIAGNOSIS — M5459 Other low back pain: Secondary | ICD-10-CM

## 2023-04-11 DIAGNOSIS — R262 Difficulty in walking, not elsewhere classified: Secondary | ICD-10-CM

## 2023-04-15 DIAGNOSIS — J0100 Acute maxillary sinusitis, unspecified: Secondary | ICD-10-CM | POA: Diagnosis not present

## 2023-04-16 ENCOUNTER — Ambulatory Visit: Payer: PPO | Attending: Family Medicine | Admitting: Physical Therapy

## 2023-04-16 DIAGNOSIS — R262 Difficulty in walking, not elsewhere classified: Secondary | ICD-10-CM | POA: Insufficient documentation

## 2023-04-16 DIAGNOSIS — M5459 Other low back pain: Secondary | ICD-10-CM | POA: Insufficient documentation

## 2023-04-16 NOTE — Therapy (Signed)
OUTPATIENT PHYSICAL THERAPY PROGRESS  NOTE   Patient Name: Brittney Ramsey MRN: [id number] DOB:01/29/1946, 77 y.o., female Today's Date: 04/16/2023  PCP: Dr. Larwance Sachs  REFERRING PROVIDER: Burman Freestone FNP   END OF SESSION:   PT End of Session - 04/16/23 1037     Visit Number 10    Number of Visits 20    Date for PT Re-Evaluation 05/20/23    Authorization Type HTA [id number]    Authorization - Visit Number 10    Authorization - Number of Visits 20    Progress Note Due on Visit 10    PT Start Time 1035    PT Stop Time 1115    PT Time Calculation (min) 40 min    Activity Tolerance Patient tolerated treatment well    Behavior During Therapy WFL for tasks assessed/performed                Past Medical History:  Diagnosis Date   Allergic rhinitis    Allergy    Anxiety    Arthritis    Complication of anesthesia    Hyperlipemia    Hypertension    Neuropathy    legs   PONV (postoperative nausea and vomiting)    Past Surgical History:  Procedure Laterality Date   CATARACT EXTRACTION W/PHACO Right 01/14/2023   Procedure: CATARACT EXTRACTION PHACO AND INTRAOCULAR LENS PLACEMENT (IOC) RIGHT  20.00  01:36.9;  Surgeon: Estanislado Pandy, MD;  Location: [facility];  Service: Ophthalmology;  Laterality: Right;   CATARACT EXTRACTION W/PHACO Left 01/24/2023   Procedure: CATARACT EXTRACTION PHACO AND INTRAOCULAR LENS PLACEMENT (IOC) LEFT  10.51  00:49.3;  Surgeon: Estanislado Pandy, MD;  Location: [facility];  Service: Ophthalmology;  Laterality: Left;   COLONOSCOPY     ESOPHAGEAL MANOMETRY N/A 12/21/2015   Procedure: ESOPHAGEAL MANOMETRY (EM);  Surgeon: Elnita Maxwell, MD;  Location: [facility];  Service: Endoscopy;  Laterality: N/A;   ESOPHAGOGASTRODUODENOSCOPY (EGD) WITH PROPOFOL N/A 11/04/2015   Procedure: ESOPHAGOGASTRODUODENOSCOPY (EGD) WITH PROPOFOL;  Surgeon: Wallace Cullens, MD;  Location: [facility] END[facility]COPY;  Service: Endoscopy;  Laterality: N/A;   HIP  ARTHROPLASTY Right 08/03/2019   Procedure: ARTHROPLASTY BIPOLAR HIP (HEMIARTHROPLASTY);  Surgeon: Donato Heinz, MD;  Location: [facility] ORS;  Service: Orthopedics;  Laterality: Right;   L3-4, L4-5 decompression and fusion  10/20/2018   Dr. Delma Officer   TUBAL LIGATION     Patient Active Problem List   Diagnosis Date Noted   HTN (hypertension) 08/02/2019   HLD (hyperlipidemia) 08/02/2019   Hip fracture, right (HCC) 08/02/2019   Hip fracture (HCC) 08/02/2019   Spondylolisthesis of lumbar region 10/20/2018   Hyponatremia 11/02/2015    REFERRING DIAG: DDD Lumbar   THERAPY DIAG:  No diagnosis found.  Rationale for Evaluation and Treatment Rehabilitation  PERTINENT HISTORY: Pt reports that she had a prior lumbar fusion in 2019 and her lower back pain improved somewhat. However, it has been getting steadily worse and she has already received three steroid injections. She also has also gotten a partial right hip replacement shortly after the back surgery because she fell and shattered her right hip. She reports having instability especially when walking fast and going up and downstairs. She wants to be able walk longer distances to be able to retrieve mail from her mailbox.   PRECAUTIONS: Falls risk   SUBJECTIVE:  SUBJECTIVE STATEMENT: Pt states she is feeling better today after taking prednisone for a sinus infection. The prednisone has really helped her joint pain. She had a near fall after stepping over uneven ground. She fell on her knees and she was not hurt.   PAIN:  Are you having pain? No   OBJECTIVE: (objective measures completed at initial evaluation unless otherwise dated)  VITALS:bp 135/57 hr 81 SpO2 100   DIAGNOSTIC FINDINGS:  CLINICAL DATA:  Low back pain radiates down posterior right hip  into leg   EXAM: MRI LUMBAR SPINE WITHOUT CONTRAST   TECHNIQUE: Multiplanar, multisequence MR imaging of the lumbar spine was performed. No intravenous contrast was administered.   COMPARISON:  08/10/2018 MRI lumbar spine, correlation is also made with 09/01/2019 lumbar spine radiographs   FINDINGS: Segmentation:  5 lumbar-type vertebral bodies.   Alignment: S shaped curvature of the thoracolumbar spine. Trace retrolisthesis of T12 on L1 and L1 on L2, which are new compared to prior. 5 mm retrolisthesis of L2 on L3, also new compared to prior. Decreased anterolisthesis of L3 on L4. No residual anterolisthesis is seen at L4-L5. 5 mm anterolisthesis L5 on S1 is new from the prior exam.   Vertebrae: Status post L3-L5 fusion and decompression. Susceptibility artifact from the hardware limits evaluation of the levels. Within this limitation, no acute fracture or suspicious osseous lesion.   Conus medullaris and cauda equina: Conus extends to the L2 level. Conus and cauda equina appear normal, although evaluation of the postoperative levels is limited by susceptibility artifact.   Paraspinal and other soft tissues: Atrophy of the inferior paraspinous muscles.   Disc levels:   T11-T12: Seen only on the sagittal images. Minimal disc bulge. No spinal canal stenosis or neural foraminal narrowing.   T12-L1: Trace retrolisthesis and minimal disc bulge. Mild facet arthropathy. No spinal canal stenosis or neural foraminal narrowing.   L1-L2: Trace retrolisthesis and minimal disc bulge. Mild facet arthropathy. No spinal canal stenosis or neural foraminal narrowing.   L2-L3: 5 mm retrolisthesis. Moderate facet arthropathy. Ligamentum flavum hypertrophy. Moderate spinal canal stenosis. Effacement of the lateral recesses, likely compressing the descending L3 nerve roots. Mild left neural foraminal narrowing, although evaluation of the neural foramina is somewhat limited by  susceptibility artifact from adjacent hardware.   L3-L4: Status post fusion and decompression. Grade 1 anterolisthesis with disc unroofing. No spinal canal stenosis or neural foraminal narrowing, although evaluation of the neural foramina is somewhat limited by susceptibility artifact.   L4-L5: Status post fusion and decompression. No spinal canal stenosis or definite neural foraminal narrowing, although evaluation of the left neural foramen is limited by susceptibility artifact.   L5-S1: 5 mm anterolisthesis with disc unroofing. Mild facet arthropathy. No spinal canal stenosis or neural foraminal narrowing, although evaluation of the neural foramina is somewhat limited by susceptibility artifact.   IMPRESSION: 1. Status post L3-L5 fusion and decompression, with no evidence of residual spinal canal stenosis or neural foraminal narrowing at these levels. 2. L2-L3 moderate spinal canal stenosis and mild left neural foraminal narrowing, which are new from the prior exam. Effacement of the lateral recesses at this level likely compresses the descending L3 nerve roots.     Electronically Signed   By: Wiliam Ke M.D.   On: 08/02/2022 19:12   PATIENT SURVEYS:  FOTO NT    SCREENING FOR RED FLAGS: Bowel or bladder incontinence: No Spinal tumors: No Cauda equina syndrome: No Compression fracture: No Abdominal aneurysm: No   COGNITION: Overall cognitive  status: Within functional limits for tasks assessed                          SENSATION: WFL   MUSCLE LENGTH: Hamstrings: Right 90 deg; Left 90 deg Thomas test: Right 0 deg; Left 0 deg   POSTURE: rounded shoulders   PALPATION: Lumbar, L1-L5 pain radiating outwards to paraspinals    LUMBAR ROM:    AROM eval  Flexion 75%  Extension 85%  Right lateral flexion 75%  Left lateral flexion 75%*  Right rotation 75%  Left rotation 75%   (Blank rows = not tested)   LOWER EXTREMITY ROM:          Active   Right 03/11/2023 Left 03/11/2023  Hip flexion 120 120  Hip extension 30 30  Hip abduction      Hip adduction      Hip internal rotation      Hip external rotation      Knee flexion 135 135  Knee extension 0 0  Ankle dorsiflexion 20 20  Ankle plantarflexion      Ankle inversion      Ankle eversion       (Blank rows = not tested)        LOWER EXTREMITY MMT:     MMT Right eval Left eval  Hip flexion 4- 4-  Hip extension NT NT  Hip abduction 3+ 3+  Hip adduction 3+ 3+  Hip internal rotation NT NT  Hip external rotation NT NT   Knee flexion 4  4  Knee extension 4+ 4+  Ankle dorsiflexion 4+ 4+  Ankle plantarflexion      Ankle inversion      Ankle eversion       (Blank rows = not tested)   LUMBAR SPECIAL TESTS:  Straight leg raise test: Positive, FABER test: Positive, and Thomas test: Positive   FUNCTIONAL TESTS:  5 times sit to stand: 15 sec  30 seconds chair stand test 9 reps    GAIT: Distance walked: 50 ft  Assistive device utilized: None Level of assistance: Complete Independence Comments: Antalgic gait on right side with decrease stand time on right and step length on  left    TODAY'S TREATMENT:                                                                                                                              DATE:   04/16/23: Nu-step seat and arms at 6 with level 3 resistance for 5 min   10 Meter Walk Test   Gait Speed:    1st trial 13.39 sec , 2nd trial 14.19 , Avg= 13.79    sec - Normal Gait speed Avg=   0.73 m/sec   < m/sec Need Intervention to reduce falls risk  0.12 m/sec improvement represents a statistically significant improvement in gait speed    - 0.4 - 0.79 m/sec - limited community  ambulator - associated with need for intervention to reduce falls; increased risk for LE limitation and death and hospitalization in 1 year; increased dependence for personal care; 2-year mortality and morbidity; increased functional impairments, severe  walking disability  HIP MMT: Hip Flex R/L 4/4, Hip Abd R/L 3+/4-, Hip Ext R/L 3+/2+  Fast and slow 10 meter walk x 10 with SPC  Standing Hip Ext on RLE with BUE support 2 x 10     04/11/23: Nu-step seat and arms at 6 with level 3 resistance for 5 min  Sit to stand 2 x 10, no UE support Seated marching 3# AW 3 x 10, each LE  Standing marching 3# AW 3x10, each LE, B UE support Seated LAQs 3# AW 3 x 10, each LE  Standing hip abduction 3# AW 3 x 10, each LE, B UE support Seated hip adduction with ball squeeze 3 x 10  Seated heel/toe raises 3 x 10, each direction on each LE Standing hamstring curls 3# AW 3 x 10, each LE, B UE support  Seated step outs over blue TB on floor with 3# AW 3 x 10 each LE  Paloff press with BTB 3 x 15 each side   04/09/23: Nu-step seat and arms at 6 with level 3 resistance for 5 min  Ambulation circuit 200' with SPC between each exercise listed below (patient required seated rest break between each bout):  -Standing hamstring curls x 10, each LE  -Sit to stand x 10, no UE support   -Seated marching x 10, each LE   -Standing hip abduction x 10, each LE, B UE support  Seated hip abduction with BTB 3 x 15, each LE  Seated marching with BTB 3 x 10, each LE Seated LAQ with 3# AW 3 x 10, each LE   04/04/23: Nu-step seat and arms at 6 with level 2 resistance for 5 min  : 300 ft with SPC Standing Marches with BUE support 1 x 10  Standing Marches with 1 UE support 1 x 10  -Pt unable to maintain upright posture Standing Marches with BUE support #3 AW 2 x 10  -Pt reports right QL  Side Lying QL Stretch 4 x 30 sec  Standing Marches with BUE support with #3 AW 2 x 10 -Pt reports ongoing right sided low back  Supine SLR   04/02/23: Nu-step seat and arms at 6 with level 2 resistance for 5 min  5xSTS: 12 sec  30 sec chair stands: 10 reps  FOTO: 53  Over ground ambulation 21 ft x 8  Standing Hip Abduction with BUE support 2 x 10  -Pt unable to abduct RLE  without compensation with hip flexor  Seated Hip Abduction with yellow band 1 x 10  Seated Hip Abduction with red band 1 x 10  Seated Hip Abduction with green band 1 x 10  Seated Hip Abduction with blue band 1 x 10 Overground ambulation with SPC 26 ft x 8 in 2 min 34 sec  -NRPS 3/10 and lower than without using [facility]     PATIENT EDUCATION:  Education details: form and technique for correct performance of exercise  Person educated: Patient Education method: Explanation, Demonstration, Verbal cues, and Handouts Education comprehension: verbalized understanding and returned demonstration   HOME EXERCISE PROGRAM: Access Code: [email or web address] URL: [email or web address] Date: 04/16/2023 Prepared by: Ellin Goodie  Exercises - Supine Lower Trunk Rotation  - 1 x daily - 3 sets - 10 reps - Supine Single Knee  to Chest Stretch  - 1 x daily - 3 sets - 10 reps - Clam  - 3-4 x weekly - 3 sets - 10 reps - Supine Bridge  - 3-4 x weekly - 3 sets - 10 reps - Standing Hip Extension with Counter Support  - 3-4 x weekly - 3 sets - 10 reps  ASSESSMENT:   CLINICAL IMPRESSION:  Pt shows some improvement with hip strength with exception of right hip extension and abduction. She also shows ongoing decreased gait speed with no improvement since eval and she still presents at risk for falls. Future session to focus on right hip strengthening and improvement with gait stability and speed. She will continue to benefit from skilled therapy to address remaining deficits in order to improve quality of life and return to PLOF.    OBJECTIVE IMPAIRMENTS: Abnormal gait, decreased balance, decreased endurance, difficulty walking, decreased strength, hypomobility, impaired flexibility, and pain.    ACTIVITY LIMITATIONS: carrying, lifting, standing, and locomotion level   PARTICIPATION LIMITATIONS: cleaning, laundry, shopping, community activity, yard work, and church   PERSONAL FACTORS: Age, Past/current  experiences, and Time since onset of injury/illness/exacerbation are also affecting patient's functional outcome.    REHAB POTENTIAL: Fair chronicity of condition and past failed treatents    CLINICAL DECISION MAKING: Stable/uncomplicated   EVALUATION COMPLEXITY: Low     GOALS: Goals reviewed with patient? No   SHORT TERM GOALS: Target date: 03/25/2023   Pt will be independent with HEP in order to improve strength and balance in order to decrease fall risk and improve function at home and work. Baseline: NT 04/02/23: Able to perform independently  Goal status: MET      LONG TERM GOALS: Target date: 05/20/2023   Patient will have improved function and activity level as evidenced by an increase in FOTO score by 10 points or more.  Baseline: 44 04/02/23: 53  Goal status: MET   2.  Patient will perform five sit to stand repetitions in <=15 secs as evidence of LE strength that shows that this community dwelling older adult that is older is at a decreased risk of falling. (Buatois 2010)   Baseline:  15 sec 04/02/23: 12 sec  Goal status: MET   3.  Patient will demonstrate improved LE endurance with completion of >=10 reps as indication of decrease risk of falling for age and gender matched norms to remain independent and avoid serious injury from falling. Baseline: 9 reps 04/02/23: 10 reps   Goal status: MET   4.  Pt will decrease 5TSTS by at least 3 seconds in order to demonstrate clinically significant improvement in LE strength Baseline: 15 sec 04/02/23: 12 sec    Goal status: MET   5.  Pt will increase by at least 12.2 m (40 ft) in order to demonstrate clinically significant improvement in cardiopulmonary endurance and community ambulation Baseline: 242 ft  04/04/23: 300 ft  Goal status: MET    6.  Patient will improve hip and abdominal strength MMT by 1/3 grade MMT (ie 4- to 4) as evidence of improved spinal stability to decrease neurogenic claudication and improve walking distance  without being limited by pain.   Baseline: Hip Flex R/L 4-/4-, Hip Abd R/L 3+/3+, Hip Add R/L 3+/3+, Hip Ext 4+/4+ 04/16/23: Hip Flex R/L 4/4, Hip Abd R/L 3+/4-, Hip Ext R/L 3+/2+ Goal status: Ongoing   7. Patient will show decreased risk of falling as evidence by decrease in self-selected gait speed by 0.12 m/sec.  Baseline: 0.73 m/sec 04/16/23: 0.73 m/sec  Goal status: Ongoing     PLAN:   PT FREQUENCY: 1-2x/week   PT DURATION: 10 weeks   PLANNED INTERVENTIONS: Therapeutic exercises, Therapeutic activity, Neuromuscular re-education, Balance training, Gait training, Joint mobilization, Joint manipulation, Stair training, Aquatic Therapy, Dry Needling, Electrical stimulation, Spinal manipulation, Spinal mobilization, Cryotherapy, Moist heat, Manual therapy, and Re-evaluation.   PLAN FOR NEXT SESSION: Progress hip extension and abduction and continue to work on walking speed and endurance   Ellin Goodie PT, DPT  Physical Therapist - [facility]  [facility]    04/16/2023, 10:40 AM

## 2023-04-18 ENCOUNTER — Ambulatory Visit: Payer: PPO | Admitting: Physical Therapy

## 2023-04-18 DIAGNOSIS — M5459 Other low back pain: Secondary | ICD-10-CM

## 2023-04-18 DIAGNOSIS — R262 Difficulty in walking, not elsewhere classified: Secondary | ICD-10-CM | POA: Diagnosis not present

## 2023-04-18 NOTE — Therapy (Signed)
OUTPATIENT PHYSICAL THERAPY TREATMENT  NOTE   Patient Name: Brittney Ramsey MRN: [id number] DOB:04-09-1946, 77 y.o., female Today's Date: 04/18/2023  PCP: Dr. Larwance Sachs  REFERRING PROVIDER: Burman Freestone FNP   END OF SESSION:   PT End of Session - 04/18/23 1123     Visit Number 11    Number of Visits 20    Date for PT Re-Evaluation 05/20/23    Authorization Type HTA [id number]    Authorization - Visit Number 11    Authorization - Number of Visits 15    Progress Note Due on Visit 15    PT Start Time 1120    PT Stop Time 1200    PT Time Calculation (min) 40 min    Activity Tolerance Patient tolerated treatment well    Behavior During Therapy WFL for tasks assessed/performed                 Past Medical History:  Diagnosis Date   Allergic rhinitis    Allergy    Anxiety    Arthritis    Complication of anesthesia    Hyperlipemia    Hypertension    Neuropathy    legs   PONV (postoperative nausea and vomiting)    Past Surgical History:  Procedure Laterality Date   CATARACT EXTRACTION W/PHACO Right 01/14/2023   Procedure: CATARACT EXTRACTION PHACO AND INTRAOCULAR LENS PLACEMENT (IOC) RIGHT  20.00  01:36.9;  Surgeon: Estanislado Pandy, MD;  Location: [facility];  Service: Ophthalmology;  Laterality: Right;   CATARACT EXTRACTION W/PHACO Left 01/24/2023   Procedure: CATARACT EXTRACTION PHACO AND INTRAOCULAR LENS PLACEMENT (IOC) LEFT  10.51  00:49.3;  Surgeon: Estanislado Pandy, MD;  Location: [facility];  Service: Ophthalmology;  Laterality: Left;   COLONOSCOPY     ESOPHAGEAL MANOMETRY N/A 12/21/2015   Procedure: ESOPHAGEAL MANOMETRY (EM);  Surgeon: Elnita Maxwell, MD;  Location: [facility] ENDOSCOPY;  Service: Endoscopy;  Laterality: N/A;   ESOPHAGOGASTRODUODENOSCOPY (EGD) WITH PROPOFOL N/A 11/04/2015   Procedure: ESOPHAGOGASTRODUODENOSCOPY (EGD) WITH PROPOFOL;  Surgeon: Wallace Cullens, MD;  Location: [facility] END[facility];  Service: Endoscopy;  Laterality: N/A;    HIP ARTHROPLASTY Right 08/03/2019   Procedure: ARTHROPLASTY BIPOLAR HIP (HEMIARTHROPLASTY);  Surgeon: Donato Heinz, MD;  Location: [facility] ORS;  Service: Orthopedics;  Laterality: Right;   L3-4, L4-5 decompression and fusion  10/20/2018   Dr. Delma Officer   TUBAL LIGATION     Patient Active Problem List   Diagnosis Date Noted   HTN (hypertension) 08/02/2019   HLD (hyperlipidemia) 08/02/2019   Hip fracture, right (HCC) 08/02/2019   Hip fracture (HCC) 08/02/2019   Spondylolisthesis of lumbar region 10/20/2018   Hyponatremia 11/02/2015    REFERRING DIAG: DDD Lumbar   THERAPY DIAG:  Other low back pain  Difficulty in walking, not elsewhere classified  Rationale for Evaluation and Treatment Rehabilitation  PERTINENT HISTORY: Pt reports that she had a prior lumbar fusion in 2019 and her lower back pain improved somewhat. However, it has been getting steadily worse and she has already received three steroid injections. She also has also gotten a partial right hip replacement shortly after the back surgery because she fell and shattered her right hip. She reports having instability especially when walking fast and going up and downstairs. She wants to be able walk longer distances to be able to retrieve mail from her mailbox.   PRECAUTIONS: Falls risk   SUBJECTIVE:  SUBJECTIVE STATEMENT: Pt reports that she was able to do all exercises without an increase in her pain. She continues to have intermittent right hip pain and requires cane to ambulate safely.     PAIN:  Are you having pain? No   OBJECTIVE: (objective measures completed at initial evaluation unless otherwise dated)  VITALS:bp 135/57 hr 81 SpO2 100   DIAGNOSTIC FINDINGS:  CLINICAL DATA:  Low back pain radiates down posterior right hip into leg    EXAM: MRI LUMBAR SPINE WITHOUT CONTRAST   TECHNIQUE: Multiplanar, multisequence MR imaging of the lumbar spine was performed. No intravenous contrast was administered.   COMPARISON:  08/10/2018 MRI lumbar spine, correlation is also made with 09/01/2019 lumbar spine radiographs   FINDINGS: Segmentation:  5 lumbar-type vertebral bodies.   Alignment: S shaped curvature of the thoracolumbar spine. Trace retrolisthesis of T12 on L1 and L1 on L2, which are new compared to prior. 5 mm retrolisthesis of L2 on L3, also new compared to prior. Decreased anterolisthesis of L3 on L4. No residual anterolisthesis is seen at L4-L5. 5 mm anterolisthesis L5 on S1 is new from the prior exam.   Vertebrae: Status post L3-L5 fusion and decompression. Susceptibility artifact from the hardware limits evaluation of the levels. Within this limitation, no acute fracture or suspicious osseous lesion.   Conus medullaris and cauda equina: Conus extends to the L2 level. Conus and cauda equina appear normal, although evaluation of the postoperative levels is limited by susceptibility artifact.   Paraspinal and other soft tissues: Atrophy of the inferior paraspinous muscles.   Disc levels:   T11-T12: Seen only on the sagittal images. Minimal disc bulge. No spinal canal stenosis or neural foraminal narrowing.   T12-L1: Trace retrolisthesis and minimal disc bulge. Mild facet arthropathy. No spinal canal stenosis or neural foraminal narrowing.   L1-L2: Trace retrolisthesis and minimal disc bulge. Mild facet arthropathy. No spinal canal stenosis or neural foraminal narrowing.   L2-L3: 5 mm retrolisthesis. Moderate facet arthropathy. Ligamentum flavum hypertrophy. Moderate spinal canal stenosis. Effacement of the lateral recesses, likely compressing the descending L3 nerve roots. Mild left neural foraminal narrowing, although evaluation of the neural foramina is somewhat limited by susceptibility  artifact from adjacent hardware.   L3-L4: Status post fusion and decompression. Grade 1 anterolisthesis with disc unroofing. No spinal canal stenosis or neural foraminal narrowing, although evaluation of the neural foramina is somewhat limited by susceptibility artifact.   L4-L5: Status post fusion and decompression. No spinal canal stenosis or definite neural foraminal narrowing, although evaluation of the left neural foramen is limited by susceptibility artifact.   L5-S1: 5 mm anterolisthesis with disc unroofing. Mild facet arthropathy. No spinal canal stenosis or neural foraminal narrowing, although evaluation of the neural foramina is somewhat limited by susceptibility artifact.   IMPRESSION: 1. Status post L3-L5 fusion and decompression, with no evidence of residual spinal canal stenosis or neural foraminal narrowing at these levels. 2. L2-L3 moderate spinal canal stenosis and mild left neural foraminal narrowing, which are new from the prior exam. Effacement of the lateral recesses at this level likely compresses the descending L3 nerve roots.     Electronically Signed   By: Wiliam Ke M.D.   On: 08/02/2022 19:12   PATIENT SURVEYS:  FOTO NT    SCREENING FOR RED FLAGS: Bowel or bladder incontinence: No Spinal tumors: No Cauda equina syndrome: No Compression fracture: No Abdominal aneurysm: No   COGNITION: Overall cognitive status: Within functional limits for tasks assessed  SENSATION: WFL   MUSCLE LENGTH: Hamstrings: Right 90 deg; Left 90 deg Thomas test: Right 0 deg; Left 0 deg   POSTURE: rounded shoulders   PALPATION: Lumbar, L1-L5 pain radiating outwards to paraspinals    LUMBAR ROM:    AROM eval  Flexion 75%  Extension 85%  Right lateral flexion 75%  Left lateral flexion 75%*  Right rotation 75%  Left rotation 75%   (Blank rows = not tested)   LOWER EXTREMITY ROM:          Active  Right 03/11/2023  Left 03/11/2023  Hip flexion 120 120  Hip extension 30 30  Hip abduction      Hip adduction      Hip internal rotation      Hip external rotation      Knee flexion 135 135  Knee extension 0 0  Ankle dorsiflexion 20 20  Ankle plantarflexion      Ankle inversion      Ankle eversion       (Blank rows = not tested)        LOWER EXTREMITY MMT:     MMT Right eval Left eval  Hip flexion 4- 4-  Hip extension NT NT  Hip abduction 3+ 3+  Hip adduction 3+ 3+  Hip internal rotation NT NT  Hip external rotation NT NT   Knee flexion 4  4  Knee extension 4+ 4+  Ankle dorsiflexion 4+ 4+  Ankle plantarflexion      Ankle inversion      Ankle eversion       (Blank rows = not tested)   LUMBAR SPECIAL TESTS:  Straight leg raise test: Positive, FABER test: Positive, and Thomas test: Positive   FUNCTIONAL TESTS:  5 times sit to stand: 15 sec  30 seconds chair stand test 9 reps    GAIT: Distance walked: 50 ft  Assistive device utilized: None Level of assistance: Complete Independence Comments: Antalgic gait on right side with decrease stand time on right and step length on  left    TODAY'S TREATMENT:                                                                                                                              DATE:   04/18/23: Nu-step seat and arms at 7 with level 3 resistance for 5 min  OMEGA Leg Press #35 2 x 10 -min VC to decrease speed of eccentric phase  Modified Thomas Stretch 6 x 30 sec (Pt providing some overpressure) -Pt reports feeling pain on lateral side of hip and pulling along anterior surface  Supine Bridges 3 x 10  -min VC to decrease eccentric phase  Clam Shells 1 x 10  Clam Shells with yellow band 2 x 10   04/16/23: Nu-step seat and arms at 6 with level 3 resistance for 5 min   10 Meter Walk Test   Gait Speed:    1st trial 13.39  sec , 2nd trial 14.19 , Avg= 13.79    sec - Normal Gait speed Avg=   0.73 m/sec   < m/sec Need Intervention to  reduce falls risk  0.12 m/sec improvement represents a statistically significant improvement in gait speed    - 0.4 - 0.79 m/sec - limited community ambulator - associated with need for intervention to reduce falls; increased risk for LE limitation and death and hospitalization in 1 year; increased dependence for personal care; 2-year mortality and morbidity; increased functional impairments, severe walking disability  HIP MMT: Hip Flex R/L 4/4, Hip Abd R/L 3+/4-, Hip Ext R/L 3+/2+  Fast and slow 10 meter walk x 10 with SPC  Standing Hip Ext on RLE with BUE support 2 x 10     04/11/23: Nu-step seat and arms at 6 with level 3 resistance for 5 min  Sit to stand 2 x 10, no UE support Seated marching 3# AW 3 x 10, each LE  Standing marching 3# AW 3x10, each LE, B UE support Seated LAQs 3# AW 3 x 10, each LE  Standing hip abduction 3# AW 3 x 10, each LE, B UE support Seated hip adduction with ball squeeze 3 x 10  Seated heel/toe raises 3 x 10, each direction on each LE Standing hamstring curls 3# AW 3 x 10, each LE, B UE support  Seated step outs over blue TB on floor with 3# AW 3 x 10 each LE  Paloff press with BTB 3 x 15 each side       PATIENT EDUCATION:  Education details: form and technique for correct performance of exercise  Person educated: Patient Education method: Explanation, Demonstration, Verbal cues, and Handouts Education comprehension: verbalized understanding and returned demonstration   HOME EXERCISE PROGRAM: Access Code: [email or web address] URL: [email or web address] Date: 04/18/2023 Prepared by: Ellin Goodie  Exercises - Supine Lower Trunk Rotation  - 1 x daily - 3 sets - 10 reps - Modified Thomas Stretch  - 1 x daily - 3 reps - 60 sec  hold - Clam  - 3-4 x weekly - 3 sets - 10 reps - Supine Bridge  - 3-4 x weekly - 3 sets - 10 reps  ASSESSMENT:   CLINICAL IMPRESSION:  Pt demonstrates improvement with hip strength with ability to perform increased  resistance with hip extension and abduction. She did not experience and increase in pain after performing the exercises and she required minimal cueing to perform correctly. She will continue to benefit from skilled therapy to address remaining deficits in order to improve quality of life and return to PLOF.     OBJECTIVE IMPAIRMENTS: Abnormal gait, decreased balance, decreased endurance, difficulty walking, decreased strength, hypomobility, impaired flexibility, and pain.    ACTIVITY LIMITATIONS: carrying, lifting, standing, and locomotion level   PARTICIPATION LIMITATIONS: cleaning, laundry, shopping, community activity, yard work, and church   PERSONAL FACTORS: Age, Past/current experiences, and Time since onset of injury/illness/exacerbation are also affecting patient's functional outcome.    REHAB POTENTIAL: Fair chronicity of condition and past failed treatents    CLINICAL DECISION MAKING: Stable/uncomplicated   EVALUATION COMPLEXITY: Low     GOALS: Goals reviewed with patient? No   SHORT TERM GOALS: Target date: 03/25/2023   Pt will be independent with HEP in order to improve strength and balance in order to decrease fall risk and improve function at home and work. Baseline: NT 04/02/23: Able to perform independently  Goal status: MET      LONG TERM GOALS:  Target date: 05/20/2023   Patient will have improved function and activity level as evidenced by an increase in FOTO score by 10 points or more.  Baseline: 44 04/02/23: 53  Goal status: MET   2.  Patient will perform five sit to stand repetitions in <=15 secs as evidence of LE strength that shows that this community dwelling older adult that is older is at a decreased risk of falling. (Buatois 2010)   Baseline:  15 sec 04/02/23: 12 sec  Goal status: MET   3.  Patient will demonstrate improved LE endurance with completion of >=10 reps as indication of decrease risk of falling for age and gender matched norms to remain  independent and avoid serious injury from falling. Baseline: 9 reps 04/02/23: 10 reps   Goal status: MET   4.  Pt will decrease 5TSTS by at least 3 seconds in order to demonstrate clinically significant improvement in LE strength Baseline: 15 sec 04/02/23: 12 sec    Goal status: MET   5.  Pt will increase by at least 12.2 m (40 ft) in order to demonstrate clinically significant improvement in cardiopulmonary endurance and community ambulation Baseline: 242 ft  04/04/23: 300 ft  Goal status: MET    6.  Patient will improve hip and abdominal strength MMT by 1/3 grade MMT (ie 4- to 4) as evidence of improved spinal stability to decrease neurogenic claudication and improve walking distance without being limited by pain.   Baseline: Hip Flex R/L 4-/4-, Hip Abd R/L 3+/3+, Hip Add R/L 3+/3+, Hip Ext 4+/4+ 04/16/23: Hip Flex R/L 4/4, Hip Abd R/L 3+/4-, Hip Ext R/L 3+/2+ Goal status: Ongoing   7. Patient will show decreased risk of falling as evidence by decrease in self-selected gait speed by 0.12 m/sec.  Baseline: 0.73 m/sec 04/16/23: 0.73 m/sec  Goal status: Ongoing     PLAN:   PT FREQUENCY: 1-2x/week   PT DURATION: 10 weeks   PLANNED INTERVENTIONS: Therapeutic exercises, Therapeutic activity, Neuromuscular re-education, Balance training, Gait training, Joint mobilization, Joint manipulation, Stair training, Aquatic Therapy, Dry Needling, Electrical stimulation, Spinal manipulation, Spinal mobilization, Cryotherapy, Moist heat, Manual therapy, and Re-evaluation.   PLAN FOR NEXT SESSION: Fast walking speed and obstacle step over. Perform hip stretches and hip strengthening: extension and abduction.   Ellin Goodie PT, DPT  Physical Therapist - [facility] 04/18/2023, 11:24 AM

## 2023-04-22 ENCOUNTER — Ambulatory Visit: Payer: PPO | Admitting: Physical Therapy

## 2023-04-22 DIAGNOSIS — M5459 Other low back pain: Secondary | ICD-10-CM

## 2023-04-22 DIAGNOSIS — R262 Difficulty in walking, not elsewhere classified: Secondary | ICD-10-CM | POA: Diagnosis not present

## 2023-04-22 NOTE — Therapy (Signed)
OUTPATIENT PHYSICAL THERAPY TREATMENT  NOTE   Patient Name: Brittney Ramsey MRN: [id number] DOB:1946-01-10, 77 y.o., female Today's Date: 04/22/2023  PCP: Dr. Larwance Sachs  REFERRING PROVIDER: Burman Freestone FNP   END OF SESSION:   PT End of Session - 04/22/23 1019     Visit Number 12    Number of Visits 20    Date for PT Re-Evaluation 05/20/23    Authorization Type HTA [id number]    Authorization - Visit Number 12    Authorization - Number of Visits 15    Progress Note Due on Visit 15    PT Start Time 0950    PT Stop Time 1030    PT Time Calculation (min) 40 min    Activity Tolerance Patient tolerated treatment well    Behavior During Therapy WFL for tasks assessed/performed                  Past Medical History:  Diagnosis Date   Allergic rhinitis    Allergy    Anxiety    Arthritis    Complication of anesthesia    Hyperlipemia    Hypertension    Neuropathy    legs   PONV (postoperative nausea and vomiting)    Past Surgical History:  Procedure Laterality Date   CATARACT EXTRACTION W/PHACO Right 01/14/2023   Procedure: CATARACT EXTRACTION PHACO AND INTRAOCULAR LENS PLACEMENT (IOC) RIGHT  20.00  01:36.9;  Surgeon: Estanislado Pandy, MD;  Location: [facility];  Service: Ophthalmology;  Laterality: Right;   CATARACT EXTRACTION W/PHACO Left 01/24/2023   Procedure: CATARACT EXTRACTION PHACO AND INTRAOCULAR LENS PLACEMENT (IOC) LEFT  10.51  00:49.3;  Surgeon: Estanislado Pandy, MD;  Location: [facility];  Service: Ophthalmology;  Laterality: Left;   COLONOSCOPY     ESOPHAGEAL MANOMETRY N/A 12/21/2015   Procedure: ESOPHAGEAL MANOMETRY (EM);  Surgeon: Elnita Maxwell, MD;  Location: [facility];  Service: Endoscopy;  Laterality: N/A;   ESOPHAGOGASTRODUODENOSCOPY (EGD) WITH PROPOFOL N/A 11/04/2015   Procedure: ESOPHAGOGASTRODUODENOSCOPY (EGD) WITH PROPOFOL;  Surgeon: Wallace Cullens, MD;  Location: [facility];  Service: Endoscopy;  Laterality: N/A;    HIP ARTHROPLASTY Right 08/03/2019   Procedure: ARTHROPLASTY BIPOLAR HIP (HEMIARTHROPLASTY);  Surgeon: Donato Heinz, MD;  Location: [facility];  Service: Orthopedics;  Laterality: Right;   L3-4, L4-5 decompression and fusion  10/20/2018   Dr. Delma Officer   TUBAL LIGATION     Patient Active Problem List   Diagnosis Date Noted   HTN (hypertension) 08/02/2019   HLD (hyperlipidemia) 08/02/2019   Hip fracture, right (HCC) 08/02/2019   Hip fracture (HCC) 08/02/2019   Spondylolisthesis of lumbar region 10/20/2018   Hyponatremia 11/02/2015    REFERRING DIAG: DDD Lumbar   THERAPY DIAG:  No diagnosis found.  Rationale for Evaluation and Treatment Rehabilitation  PERTINENT HISTORY: Pt reports that she had a prior lumbar fusion in 2019 and her lower back pain improved somewhat. However, it has been getting steadily worse and she has already received three steroid injections. She also has also gotten a partial right hip replacement shortly after the back surgery because she fell and shattered her right hip. She reports having instability especially when walking fast and going up and downstairs. She wants to be able walk longer distances to be able to retrieve mail from her mailbox.   PRECAUTIONS: Falls risk   SUBJECTIVE:  SUBJECTIVE STATEMENT: Pt states that doing exercises on her bed is difficult. She has not been feeling as much pain in the right hip as she used to.   PAIN:  Are you having pain? No   OBJECTIVE: (objective measures completed at initial evaluation unless otherwise dated)  VITALS:bp 135/57 hr 81 SpO2 100   DIAGNOSTIC FINDINGS:  CLINICAL DATA:  Low back pain radiates down posterior right hip into leg   EXAM: MRI LUMBAR SPINE WITHOUT CONTRAST   TECHNIQUE: Multiplanar, multisequence MR  imaging of the lumbar spine was performed. No intravenous contrast was administered.   COMPARISON:  08/10/2018 MRI lumbar spine, correlation is also made with 09/01/2019 lumbar spine radiographs   FINDINGS: Segmentation:  5 lumbar-type vertebral bodies.   Alignment: S shaped curvature of the thoracolumbar spine. Trace retrolisthesis of T12 on L1 and L1 on L2, which are new compared to prior. 5 mm retrolisthesis of L2 on L3, also new compared to prior. Decreased anterolisthesis of L3 on L4. No residual anterolisthesis is seen at L4-L5. 5 mm anterolisthesis L5 on S1 is new from the prior exam.   Vertebrae: Status post L3-L5 fusion and decompression. Susceptibility artifact from the hardware limits evaluation of the levels. Within this limitation, no acute fracture or suspicious osseous lesion.   Conus medullaris and cauda equina: Conus extends to the L2 level. Conus and cauda equina appear normal, although evaluation of the postoperative levels is limited by susceptibility artifact.   Paraspinal and other soft tissues: Atrophy of the inferior paraspinous muscles.   Disc levels:   T11-T12: Seen only on the sagittal images. Minimal disc bulge. No spinal canal stenosis or neural foraminal narrowing.   T12-L1: Trace retrolisthesis and minimal disc bulge. Mild facet arthropathy. No spinal canal stenosis or neural foraminal narrowing.   L1-L2: Trace retrolisthesis and minimal disc bulge. Mild facet arthropathy. No spinal canal stenosis or neural foraminal narrowing.   L2-L3: 5 mm retrolisthesis. Moderate facet arthropathy. Ligamentum flavum hypertrophy. Moderate spinal canal stenosis. Effacement of the lateral recesses, likely compressing the descending L3 nerve roots. Mild left neural foraminal narrowing, although evaluation of the neural foramina is somewhat limited by susceptibility artifact from adjacent hardware.   L3-L4: Status post fusion and decompression. Grade 1  anterolisthesis with disc unroofing. No spinal canal stenosis or neural foraminal narrowing, although evaluation of the neural foramina is somewhat limited by susceptibility artifact.   L4-L5: Status post fusion and decompression. No spinal canal stenosis or definite neural foraminal narrowing, although evaluation of the left neural foramen is limited by susceptibility artifact.   L5-S1: 5 mm anterolisthesis with disc unroofing. Mild facet arthropathy. No spinal canal stenosis or neural foraminal narrowing, although evaluation of the neural foramina is somewhat limited by susceptibility artifact.   IMPRESSION: 1. Status post L3-L5 fusion and decompression, with no evidence of residual spinal canal stenosis or neural foraminal narrowing at these levels. 2. L2-L3 moderate spinal canal stenosis and mild left neural foraminal narrowing, which are new from the prior exam. Effacement of the lateral recesses at this level likely compresses the descending L3 nerve roots.     Electronically Signed   By: Wiliam Ke M.D.   On: 08/02/2022 19:12   PATIENT SURVEYS:  FOTO NT    SCREENING FOR RED FLAGS: Bowel or bladder incontinence: No Spinal tumors: No Cauda equina syndrome: No Compression fracture: No Abdominal aneurysm: No   COGNITION: Overall cognitive status: Within functional limits for tasks assessed  SENSATION: WFL   MUSCLE LENGTH: Hamstrings: Right 90 deg; Left 90 deg Thomas test: Right 0 deg; Left 0 deg   POSTURE: rounded shoulders   PALPATION: Lumbar, L1-L5 pain radiating outwards to paraspinals    LUMBAR ROM:    AROM eval  Flexion 75%  Extension 85%  Right lateral flexion 75%  Left lateral flexion 75%*  Right rotation 75%  Left rotation 75%   (Blank rows = not tested)   LOWER EXTREMITY ROM:          Active  Right 03/11/2023 Left 03/11/2023  Hip flexion 120 120  Hip extension 30 30  Hip abduction      Hip adduction       Hip internal rotation      Hip external rotation      Knee flexion 135 135  Knee extension 0 0  Ankle dorsiflexion 20 20  Ankle plantarflexion      Ankle inversion      Ankle eversion       (Blank rows = not tested)        LOWER EXTREMITY MMT:     MMT Right eval Left eval  Hip flexion 4- 4-  Hip extension NT NT  Hip abduction 3+ 3+  Hip adduction 3+ 3+  Hip internal rotation NT NT  Hip external rotation NT NT   Knee flexion 4  4  Knee extension 4+ 4+  Ankle dorsiflexion 4+ 4+  Ankle plantarflexion      Ankle inversion      Ankle eversion       (Blank rows = not tested)   LUMBAR SPECIAL TESTS:  Straight leg raise test: Positive, FABER test: Positive, and Thomas test: Positive   FUNCTIONAL TESTS:  5 times sit to stand: 15 sec  30 seconds chair stand test 9 reps    GAIT: Distance walked: 50 ft  Assistive device utilized: None Level of assistance: Complete Independence Comments: Antalgic gait on right side with decrease stand time on right and step length on  left    TODAY'S TREATMENT:                                                                                                                              DATE:   04/20/23: Nu-Step seat and arms at 8 with level 3 resistance for 5 min  10 m fast walk x 8 10 m 3 ankle weight step over x 10  10 m x 3 1 ft obstacles (yellow) x 10  Mini-Squat with BUE support 3 x 10 -min VC to decrease speed of lowering  Supine HS Stretch R/L 70 deg/ 70 deg   Seated HS Stretch R/L 2 X 60 sec  04/18/23: Nu-step seat and arms at 7 with level 3 resistance for 5 min  OMEGA Leg Press #35 2 x 10 -min VC to decrease speed of eccentric phase  Modified Thomas Stretch 6 x 30 sec (Pt providing  some overpressure) -Pt reports feeling pain on lateral side of hip and pulling along anterior surface  Supine Bridges 3 x 10  -min VC to decrease eccentric phase  Clam Shells 1 x 10  Clam Shells with yellow band 2 x 10   04/16/23: Nu-step seat  and arms at 6 with level 3 resistance for 5 min   10 Meter Walk Test   Gait Speed:    1st trial 13.39 sec , 2nd trial 14.19 , Avg= 13.79    sec - Normal Gait speed Avg=   0.73 m/sec   < m/sec Need Intervention to reduce falls risk  0.12 m/sec improvement represents a statistically significant improvement in gait speed    - 0.4 - 0.79 m/sec - limited community ambulator - associated with need for intervention to reduce falls; increased risk for LE limitation and death and hospitalization in 1 year; increased dependence for personal care; 2-year mortality and morbidity; increased functional impairments, severe walking disability  HIP MMT: Hip Flex R/L 4/4, Hip Abd R/L 3+/4-, Hip Ext R/L 3+/2+  Fast and slow 10 meter walk x 10 with SPC  Standing Hip Ext on RLE with BUE support 2 x 10       PATIENT EDUCATION:  Education details: form and technique for correct performance of exercise  Person educated: Patient Education method: Explanation, Demonstration, Verbal cues, and Handouts Education comprehension: verbalized understanding and returned demonstration   HOME EXERCISE PROGRAM: Access Code: [email or web address] URL: [email or web address] Date: 04/22/2023 Prepared by: Ellin Goodie  Exercises - Supine Lower Trunk Rotation  - 1 x daily - 3 sets - 10 reps - Seated Hamstring Stretch  - 1 x daily - 3 reps - 30-60 sec  hold - Modified Thomas Stretch  - 1 x daily - 3 reps - 60 sec  hold - Clam  - 3-4 x weekly - 3 sets - 10 reps - Mini Squat  - 3-4 x weekly - 3 sets - 10 reps  ASSESSMENT:   CLINICAL IMPRESSION:  Pt continues to show improvement with activity tolerance with ability to perform dynamic balance exercises without an increase in hip pain. HEP modified to include non-supine exercises to avoid pt having difficulty performing on her own bed which is too soft. She will continue to benefit from skilled therapy to address remaining deficits in order to improve quality of life  and return to PLOF.   OBJECTIVE IMPAIRMENTS: Abnormal gait, decreased balance, decreased endurance, difficulty walking, decreased strength, hypomobility, impaired flexibility, and pain.    ACTIVITY LIMITATIONS: carrying, lifting, standing, and locomotion level   PARTICIPATION LIMITATIONS: cleaning, laundry, shopping, community activity, yard work, and church   PERSONAL FACTORS: Age, Past/current experiences, and Time since onset of injury/illness/exacerbation are also affecting patient's functional outcome.    REHAB POTENTIAL: Fair chronicity of condition and past failed treatents    CLINICAL DECISION MAKING: Stable/uncomplicated   EVALUATION COMPLEXITY: Low     GOALS: Goals reviewed with patient? No   SHORT TERM GOALS: Target date: 03/25/2023   Pt will be independent with HEP in order to improve strength and balance in order to decrease fall risk and improve function at home and work. Baseline: NT 04/02/23: Able to perform independently  Goal status: MET      LONG TERM GOALS: Target date: 05/20/2023   Patient will have improved function and activity level as evidenced by an increase in FOTO score by 10 points or more.  Baseline: 44 04/02/23: 53  Goal status: MET  2.  Patient will perform five sit to stand repetitions in <=15 secs as evidence of LE strength that shows that this community dwelling older adult that is older is at a decreased risk of falling. (Buatois 2010)   Baseline:  15 sec 04/02/23: 12 sec  Goal status: MET   3.  Patient will demonstrate improved LE endurance with completion of >=10 reps as indication of decrease risk of falling for age and gender matched norms to remain independent and avoid serious injury from falling. Baseline: 9 reps 04/02/23: 10 reps   Goal status: MET   4.  Pt will decrease 5TSTS by at least 3 seconds in order to demonstrate clinically significant improvement in LE strength Baseline: 15 sec 04/02/23: 12 sec    Goal status: MET   5.  Pt  will increase by at least 12.2 m (40 ft) in order to demonstrate clinically significant improvement in cardiopulmonary endurance and community ambulation Baseline: 242 ft  04/04/23: 300 ft  Goal status: MET    6.  Patient will improve hip and abdominal strength MMT by 1/3 grade MMT (ie 4- to 4) as evidence of improved spinal stability to decrease neurogenic claudication and improve walking distance without being limited by pain.   Baseline: Hip Flex R/L 4-/4-, Hip Abd R/L 3+/3+, Hip Add R/L 3+/3+, Hip Ext 4+/4+ 04/16/23: Hip Flex R/L 4/4, Hip Abd R/L 3+/4-, Hip Ext R/L 3+/2+ Goal status: Ongoing   7. Patient will show decreased risk of falling as evidence by decrease in self-selected gait speed by 0.12 m/sec.  Baseline: 0.73 m/sec 04/16/23: 0.73 m/sec  Goal status: Ongoing     PLAN:   PT FREQUENCY: 1-2x/week   PT DURATION: 10 weeks   PLANNED INTERVENTIONS: Therapeutic exercises, Therapeutic activity, Neuromuscular re-education, Balance training, Gait training, Joint mobilization, Joint manipulation, Stair training, Aquatic Therapy, Dry Needling, Electrical stimulation, Spinal manipulation, Spinal mobilization, Cryotherapy, Moist heat, Manual therapy, and Re-evaluation.   PLAN FOR NEXT SESSION: Continue to progress hip abduction and extension strengthening exercises and dynamic balance exercises.   Ellin Goodie PT, DPT  Physical Therapist - [facility] 04/22/2023, 10:24 AM

## 2023-04-23 DIAGNOSIS — K5909 Other constipation: Secondary | ICD-10-CM | POA: Diagnosis not present

## 2023-04-23 DIAGNOSIS — R141 Gas pain: Secondary | ICD-10-CM | POA: Diagnosis not present

## 2023-04-23 DIAGNOSIS — K219 Gastro-esophageal reflux disease without esophagitis: Secondary | ICD-10-CM | POA: Diagnosis not present

## 2023-04-23 DIAGNOSIS — K449 Diaphragmatic hernia without obstruction or gangrene: Secondary | ICD-10-CM | POA: Diagnosis not present

## 2023-04-23 DIAGNOSIS — K222 Esophageal obstruction: Secondary | ICD-10-CM | POA: Diagnosis not present

## 2023-04-24 ENCOUNTER — Ambulatory Visit: Payer: PPO | Admitting: Physical Therapy

## 2023-04-24 DIAGNOSIS — R262 Difficulty in walking, not elsewhere classified: Secondary | ICD-10-CM

## 2023-04-24 DIAGNOSIS — M5459 Other low back pain: Secondary | ICD-10-CM

## 2023-04-24 NOTE — Therapy (Signed)
OUTPATIENT PHYSICAL THERAPY TREATMENT  NOTE   Patient Name: Brittney Ramsey MRN: [id number] DOB:09-Jul-1946, 77 y.o., female Today's Date: 04/24/2023  PCP: Dr. Larwance Sachs  REFERRING PROVIDER: Burman Freestone FNP   END OF SESSION:   PT End of Session - 04/24/23 1041     Visit Number 13    Number of Visits 20    Date for PT Re-Evaluation 05/20/23    Authorization Type HTA [id number]    Authorization - Visit Number 13    Authorization - Number of Visits 15    Progress Note Due on Visit 15    PT Start Time 1035    PT Stop Time 1115    PT Time Calculation (min) 40 min    Activity Tolerance Patient tolerated treatment well    Behavior During Therapy WFL for tasks assessed/performed                   Past Medical History:  Diagnosis Date   Allergic rhinitis    Allergy    Anxiety    Arthritis    Complication of anesthesia    Hyperlipemia    Hypertension    Neuropathy    legs   PONV (postoperative nausea and vomiting)    Past Surgical History:  Procedure Laterality Date   CATARACT EXTRACTION W/PHACO Right 01/14/2023   Procedure: CATARACT EXTRACTION PHACO AND INTRAOCULAR LENS PLACEMENT (IOC) RIGHT  20.00  01:36.9;  Surgeon: Estanislado Pandy, MD;  Location: [facility];  Service: Ophthalmology;  Laterality: Right;   CATARACT EXTRACTION W/PHACO Left 01/24/2023   Procedure: CATARACT EXTRACTION PHACO AND INTRAOCULAR LENS PLACEMENT (IOC) LEFT  10.51  00:49.3;  Surgeon: Estanislado Pandy, MD;  Location: [facility];  Service: Ophthalmology;  Laterality: Left;   COLONOSCOPY     ESOPHAGEAL MANOMETRY N/A 12/21/2015   Procedure: ESOPHAGEAL MANOMETRY (EM);  Surgeon: Elnita Maxwell, MD;  Location: [facility];  Service: Endoscopy;  Laterality: N/A;   ESOPHAGOGASTRODUODENOSCOPY (EGD) WITH PROPOFOL N/A 11/04/2015   Procedure: ESOPHAGOGASTRODUODENOSCOPY (EGD) WITH PROPOFOL;  Surgeon: Wallace Cullens, MD;  Location: [facility] END[facility]COPY;  Service: Endoscopy;  Laterality:  N/A;   HIP ARTHROPLASTY Right 08/03/2019   Procedure: ARTHROPLASTY BIPOLAR HIP (HEMIARTHROPLASTY);  Surgeon: Donato Heinz, MD;  Location: [facility];  Service: Orthopedics;  Laterality: Right;   L3-4, L4-5 decompression and fusion  10/20/2018   Dr. Delma Officer   TUBAL LIGATION     Patient Active Problem List   Diagnosis Date Noted   HTN (hypertension) 08/02/2019   HLD (hyperlipidemia) 08/02/2019   Hip fracture, right (HCC) 08/02/2019   Hip fracture (HCC) 08/02/2019   Spondylolisthesis of lumbar region 10/20/2018   Hyponatremia 11/02/2015    REFERRING DIAG: DDD Lumbar   THERAPY DIAG:  Other low back pain  Difficulty in walking, not elsewhere classified  Rationale for Evaluation and Treatment Rehabilitation  PERTINENT HISTORY: Pt reports that she had a prior lumbar fusion in 2019 and her lower back pain improved somewhat. However, it has been getting steadily worse and she has already received three steroid injections. She also has also gotten a partial right hip replacement shortly after the back surgery because she fell and shattered her right hip. She reports having instability especially when walking fast and going up and downstairs. She wants to be able walk longer distances to be able to retrieve mail from her mailbox.   PRECAUTIONS: Falls risk   SUBJECTIVE:  SUBJECTIVE STATEMENT: She states that her exercises have been going well with the exception of doing exercises in the bed which have proven to be very difficult. Pt reports that she is still having low back pain when walking around her house especially when she does not use her cane and she plans on using her cane more.   PAIN:  Are you having pain? No   OBJECTIVE: (objective measures completed at initial evaluation unless otherwise  dated)  VITALS:bp 135/57 hr 81 SpO2 100   DIAGNOSTIC FINDINGS:  CLINICAL DATA:  Low back pain radiates down posterior right hip into leg   EXAM: MRI LUMBAR SPINE WITHOUT CONTRAST   TECHNIQUE: Multiplanar, multisequence MR imaging of the lumbar spine was performed. No intravenous contrast was administered.   COMPARISON:  08/10/2018 MRI lumbar spine, correlation is also made with 09/01/2019 lumbar spine radiographs   FINDINGS: Segmentation:  5 lumbar-type vertebral bodies.   Alignment: S shaped curvature of the thoracolumbar spine. Trace retrolisthesis of T12 on L1 and L1 on L2, which are new compared to prior. 5 mm retrolisthesis of L2 on L3, also new compared to prior. Decreased anterolisthesis of L3 on L4. No residual anterolisthesis is seen at L4-L5. 5 mm anterolisthesis L5 on S1 is new from the prior exam.   Vertebrae: Status post L3-L5 fusion and decompression. Susceptibility artifact from the hardware limits evaluation of the levels. Within this limitation, no acute fracture or suspicious osseous lesion.   Conus medullaris and cauda equina: Conus extends to the L2 level. Conus and cauda equina appear normal, although evaluation of the postoperative levels is limited by susceptibility artifact.   Paraspinal and other soft tissues: Atrophy of the inferior paraspinous muscles.   Disc levels:   T11-T12: Seen only on the sagittal images. Minimal disc bulge. No spinal canal stenosis or neural foraminal narrowing.   T12-L1: Trace retrolisthesis and minimal disc bulge. Mild facet arthropathy. No spinal canal stenosis or neural foraminal narrowing.   L1-L2: Trace retrolisthesis and minimal disc bulge. Mild facet arthropathy. No spinal canal stenosis or neural foraminal narrowing.   L2-L3: 5 mm retrolisthesis. Moderate facet arthropathy. Ligamentum flavum hypertrophy. Moderate spinal canal stenosis. Effacement of the lateral recesses, likely compressing the descending  L3 nerve roots. Mild left neural foraminal narrowing, although evaluation of the neural foramina is somewhat limited by susceptibility artifact from adjacent hardware.   L3-L4: Status post fusion and decompression. Grade 1 anterolisthesis with disc unroofing. No spinal canal stenosis or neural foraminal narrowing, although evaluation of the neural foramina is somewhat limited by susceptibility artifact.   L4-L5: Status post fusion and decompression. No spinal canal stenosis or definite neural foraminal narrowing, although evaluation of the left neural foramen is limited by susceptibility artifact.   L5-S1: 5 mm anterolisthesis with disc unroofing. Mild facet arthropathy. No spinal canal stenosis or neural foraminal narrowing, although evaluation of the neural foramina is somewhat limited by susceptibility artifact.   IMPRESSION: 1. Status post L3-L5 fusion and decompression, with no evidence of residual spinal canal stenosis or neural foraminal narrowing at these levels. 2. L2-L3 moderate spinal canal stenosis and mild left neural foraminal narrowing, which are new from the prior exam. Effacement of the lateral recesses at this level likely compresses the descending L3 nerve roots.     Electronically Signed   By: Wiliam Ke M.D.   On: 08/02/2022 19:12   PATIENT SURVEYS:  FOTO NT    SCREENING FOR RED FLAGS: Bowel or bladder incontinence: No Spinal tumors: No Cauda  equina syndrome: No Compression fracture: No Abdominal aneurysm: No   COGNITION: Overall cognitive status: Within functional limits for tasks assessed                          SENSATION: WFL   MUSCLE LENGTH: Hamstrings: Right 90 deg; Left 90 deg Thomas test: Right 0 deg; Left 0 deg   POSTURE: rounded shoulders   PALPATION: Lumbar, L1-L5 pain radiating outwards to paraspinals    LUMBAR ROM:    AROM eval  Flexion 75%  Extension 85%  Right lateral flexion 75%  Left lateral flexion 75%*  Right  rotation 75%  Left rotation 75%   (Blank rows = not tested)   LOWER EXTREMITY ROM:          Active  Right 03/11/2023 Left 03/11/2023  Hip flexion 120 120  Hip extension 30 30  Hip abduction      Hip adduction      Hip internal rotation      Hip external rotation      Knee flexion 135 135  Knee extension 0 0  Ankle dorsiflexion 20 20  Ankle plantarflexion      Ankle inversion      Ankle eversion       (Blank rows = not tested)        LOWER EXTREMITY MMT:     MMT Right eval Left eval  Hip flexion 4- 4-  Hip extension NT NT  Hip abduction 3+ 3+  Hip adduction 3+ 3+  Hip internal rotation NT NT  Hip external rotation NT NT   Knee flexion 4  4  Knee extension 4+ 4+  Ankle dorsiflexion 4+ 4+  Ankle plantarflexion      Ankle inversion      Ankle eversion       (Blank rows = not tested)   LUMBAR SPECIAL TESTS:  Straight leg raise test: Positive, FABER test: Positive, and Thomas test: Positive   FUNCTIONAL TESTS:  5 times sit to stand: 15 sec  30 seconds chair stand test 9 reps    GAIT: Distance walked: 50 ft  Assistive device utilized: None Level of assistance: Complete Independence Comments: Antalgic gait on right side with decrease stand time on right and step length on  left    TODAY'S TREATMENT:                                                                                                                              DATE:   04/24/23: Nu-Step seat and arms at 7 with level 3 resistance for 5 min  OMEGA Leg Press #55 with seat at 3-  3 x 10  Seated Hip Abduction with Blue Band 3 x 10  Mini-Squat with red yoga ball 1 x 10 Mini-Squat with red yoga ball #4 Db  2 x 10   Descending Marches 1 x 10 -Pt reports increased pain in low  back  Supine Marches with knee extension 2 x 10  -mod VC due to pt's difficulty coordinating exercise   04/20/23: Nu-Step seat and arms at 8 with level 3 resistance for 5 min  10 m fast walk x 8 10 m 3 ankle weight step over x 10   10 m x 3 1 ft obstacles (yellow) x 10  Mini-Squat with BUE support 3 x 10 -min VC to decrease speed of lowering  Supine HS Stretch R/L 70 deg/ 70 deg   Seated HS Stretch R/L 2 X 60 sec  04/18/23: Nu-step seat and arms at 7 with level 3 resistance for 5 min  OMEGA Leg Press #35 2 x 10 -min VC to decrease speed of eccentric phase  Modified Thomas Stretch 6 x 30 sec (Pt providing some overpressure) -Pt reports feeling pain on lateral side of hip and pulling along anterior surface  Supine Bridges 3 x 10  -min VC to decrease eccentric phase  Clam Shells 1 x 10  Clam Shells with yellow band 2 x 10   04/16/23: Nu-step seat and arms at 6 with level 3 resistance for 5 min   10 Meter Walk Test   Gait Speed:    1st trial 13.39 sec , 2nd trial 14.19 , Avg= 13.79    sec - Normal Gait speed Avg=   0.73 m/sec   < m/sec Need Intervention to reduce falls risk  0.12 m/sec improvement represents a statistically significant improvement in gait speed    - 0.4 - 0.79 m/sec - limited community ambulator - associated with need for intervention to reduce falls; increased risk for LE limitation and death and hospitalization in 1 year; increased dependence for personal care; 2-year mortality and morbidity; increased functional impairments, severe walking disability  HIP MMT: Hip Flex R/L 4/4, Hip Abd R/L 3+/4-, Hip Ext R/L 3+/2+  Fast and slow 10 meter walk x 10 with SPC  Standing Hip Ext on RLE with BUE support 2 x 10       PATIENT EDUCATION:  Education details: form and technique for correct performance of exercise  Person educated: Patient Education method: Explanation, Demonstration, Verbal cues, and Handouts Education comprehension: verbalized understanding and returned demonstration   HOME EXERCISE PROGRAM: Access Code: [email or web address] URL: [email or web address] Date: 04/24/2023 Prepared by: Ellin Goodie  Exercises - Supine Lower Trunk Rotation  - 1 x daily - 3 sets - 10  reps - Seated Hamstring Stretch  - 1 x daily - 3 reps - 30-60 sec  hold - Modified Thomas Stretch  - 1 x daily - 3 reps - 60 sec  hold - Mini Squat  - 3-4 x weekly - 3 sets - 10 reps - Seated Hip Abduction with Resistance  - 3-4 x weekly - 3 sets - 10 reps - Supine March  - 3 x weekly - 3 sets - 10 reps  ASSESSMENT:   CLINICAL IMPRESSION:  Pt demonstrates improvement with hip and abdominal strength with ability to complete exercises with increased resistance and with little to no increase in her low back pain. She did benefit from modification to HEP that includes use of yoga ball to better help support spine during mini-squat and seated hip abduction to avoid difficulty performing clam shells on bed. She will continue to benefit from skilled therapy to address remaining deficits in order to improve quality of life and return to PLOF.    OBJECTIVE IMPAIRMENTS: Abnormal gait, decreased balance, decreased endurance, difficulty walking, decreased strength, hypomobility,  impaired flexibility, and pain.    ACTIVITY LIMITATIONS: carrying, lifting, standing, and locomotion level   PARTICIPATION LIMITATIONS: cleaning, laundry, shopping, community activity, yard work, and church   PERSONAL FACTORS: Age, Past/current experiences, and Time since onset of injury/illness/exacerbation are also affecting patient's functional outcome.    REHAB POTENTIAL: Fair chronicity of condition and past failed treatents    CLINICAL DECISION MAKING: Stable/uncomplicated   EVALUATION COMPLEXITY: Low     GOALS: Goals reviewed with patient? No   SHORT TERM GOALS: Target date: 03/25/2023   Pt will be independent with HEP in order to improve strength and balance in order to decrease fall risk and improve function at home and work. Baseline: NT 04/02/23: Able to perform independently  Goal status: MET      LONG TERM GOALS: Target date: 05/20/2023   Patient will have improved function and activity level as  evidenced by an increase in FOTO score by 10 points or more.  Baseline: 44 04/02/23: 53  Goal status: MET   2.  Patient will perform five sit to stand repetitions in <=15 secs as evidence of LE strength that shows that this community dwelling older adult that is older is at a decreased risk of falling. ([place]atois 2010)   Baseline:  15 sec 04/02/23: 12 sec  Goal status: MET   3.  Patient will demonstrate improved LE endurance with completion of >=10 reps as indication of decrease risk of falling for age and gender matched norms to remain independent and avoid serious injury from falling. Baseline: 9 reps 04/02/23: 10 reps   Goal status: MET   4.  Pt will decrease 5TSTS by at least 3 seconds in order to demonstrate clinically significant improvement in LE strength Baseline: 15 sec 04/02/23: 12 sec    Goal status: MET   5.  Pt will increase by at least 12.2 m (40 ft) in order to demonstrate clinically significant improvement in cardiopulmonary endurance and community ambulation Baseline: 242 ft  04/04/23: 300 ft  Goal status: MET    6.  Patient will improve hip and abdominal strength MMT by 1/3 grade MMT (ie 4- to 4) as evidence of improved spinal stability to decrease neurogenic claudication and improve walking distance without being limited by pain.   Baseline: Hip Flex R/L 4-/4-, Hip Abd R/L 3+/3+, Hip Add R/L 3+/3+, Hip Ext 4+/4+ 04/16/23: Hip Flex R/L 4/4, Hip Abd R/L 3+/4-, Hip Ext R/L 3+/2+ Goal status: Ongoing   7. Patient will show decreased risk of falling as evidence by decrease in self-selected gait speed by 0.12 m/sec.  Baseline: 0.73 m/sec 04/16/23: 0.73 m/sec  Goal status: Ongoing     PLAN:   PT FREQUENCY: 1-2x/week   PT DURATION: 10 weeks   PLANNED INTERVENTIONS: Therapeutic exercises, Therapeutic activity, Neuromuscular re-education, Balance training, Gait training, Joint mobilization, Joint manipulation, Stair training, Aquatic Therapy, Dry Needling, Electrical  stimulation, Spinal manipulation, Spinal mobilization, Cryotherapy, Moist heat, Manual therapy, and Re-evaluation.   PLAN FOR NEXT SESSION: TM warm up. Continue to progress hip abduction and extension strengthening exercises and dynamic balance exercises: fast and slow walking, wall sits and seated hip abduction with increased resistance.  Ellin Goodie PT, DPT  Physical Therapist - [facility] 04/24/2023, 11:18 AM

## 2023-04-29 ENCOUNTER — Ambulatory Visit: Payer: PPO | Admitting: Physical Therapy

## 2023-04-29 DIAGNOSIS — R262 Difficulty in walking, not elsewhere classified: Secondary | ICD-10-CM

## 2023-04-29 DIAGNOSIS — M5459 Other low back pain: Secondary | ICD-10-CM

## 2023-04-29 NOTE — Therapy (Signed)
OUTPATIENT PHYSICAL THERAPY TREATMENT  NOTE   Patient Name: Brittney Ramsey MRN: [id number] DOB:Feb 24, 1946, 77 y.o., female Today's Date: 04/24/2023  PCP: Dr. Larwance Sachs  REFERRING PROVIDER: Burman Freestone FNP   END OF SESSION:   PT End of Session - 04/24/23 1041     Visit Number 14   Number of Visits 20    Date for PT Re-Evaluation 05/20/23    Authorization Type HTA [id number]    Authorization - Visit Number 14    Authorization - Number of Visits 15    Progress Note Due on Visit 15    PT Start Time 1035    PT Stop Time 1115    PT Time Calculation (min) 40 min    Activity Tolerance Patient tolerated treatment well    Behavior During Therapy WFL for tasks assessed/performed                   Past Medical History:  Diagnosis Date   Allergic rhinitis    Allergy    Anxiety    Arthritis    Complication of anesthesia    Hyperlipemia    Hypertension    Neuropathy    legs   PONV (postoperative nausea and vomiting)    Past Surgical History:  Procedure Laterality Date   CATARACT EXTRACTION W/PHACO Right 01/14/2023   Procedure: CATARACT EXTRACTION PHACO AND INTRAOCULAR LENS PLACEMENT (IOC) RIGHT  20.00  01:36.9;  Surgeon: Estanislado Pandy, MD;  Location: [facility];  Service: Ophthalmology;  Laterality: Right;   CATARACT EXTRACTION W/PHACO Left 01/24/2023   Procedure: CATARACT EXTRACTION PHACO AND INTRAOCULAR LENS PLACEMENT (IOC) LEFT  10.51  00:49.3;  Surgeon: Estanislado Pandy, MD;  Location: [facility];  Service: Ophthalmology;  Laterality: Left;   COLONOSCOPY     ESOPHAGEAL MANOMETRY N/A 12/21/2015   Procedure: ESOPHAGEAL MANOMETRY (EM);  Surgeon: Elnita Maxwell, MD;  Location: [facility];  Service: Endoscopy;  Laterality: N/A;   ESOPHAGOGASTRODUODENOSCOPY (EGD) WITH PROPOFOL N/A 11/04/2015   Procedure: ESOPHAGOGASTRODUODENOSCOPY (EGD) WITH PROPOFOL;  Surgeon: Wallace Cullens, MD;  Location: [facility];  Service: Endoscopy;  Laterality: N/A;    HIP ARTHROPLASTY Right 08/03/2019   Procedure: ARTHROPLASTY BIPOLAR HIP (HEMIARTHROPLASTY);  Surgeon: Donato Heinz, MD;  Location: [facility];  Service: Orthopedics;  Laterality: Right;   L3-4, L4-5 decompression and fusion  10/20/2018   Dr. Delma Officer   TUBAL LIGATION     Patient Active Problem List   Diagnosis Date Noted   HTN (hypertension) 08/02/2019   HLD (hyperlipidemia) 08/02/2019   Hip fracture, right (HCC) 08/02/2019   Hip fracture (HCC) 08/02/2019   Spondylolisthesis of lumbar region 10/20/2018   Hyponatremia 11/02/2015    REFERRING DIAG: DDD Lumbar   THERAPY DIAG:  Other low back pain  Difficulty in walking, not elsewhere classified. Rationale for Evaluation and Treatment Rehabilitation  PERTINENT HISTORY: Pt reports that she had a prior lumbar fusion in 2019 and her lower back pain improved somewhat. However, it has been getting steadily worse and she has already received three steroid injections. She also has also gotten a partial right hip replacement shortly after the back surgery because she fell and shattered her right hip. She reports having instability especially when walking fast and going up and downstairs. She wants to be able walk longer distances to be able to retrieve mail from her mailbox.   PRECAUTIONS: Falls risk   SUBJECTIVE:  SUBJECTIVE STATEMENT: Pt states that she continues to feel a low amount of pain and she has not had any falls since last visit.  She showed up 15 min late.   PAIN:  Are you having pain? No   OBJECTIVE: (objective measures completed at initial evaluation unless otherwise dated)  VITALS:bp 135/57 hr 81 SpO2 100   DIAGNOSTIC FINDINGS:  CLINICAL DATA:  Low back pain radiates down posterior right hip into leg   EXAM: MRI LUMBAR SPINE WITHOUT  CONTRAST   TECHNIQUE: Multiplanar, multisequence MR imaging of the lumbar spine was performed. No intravenous contrast was administered.   COMPARISON:  08/10/2018 MRI lumbar spine, correlation is also made with 09/01/2019 lumbar spine radiographs   FINDINGS: Segmentation:  5 lumbar-type vertebral bodies.   Alignment: S shaped curvature of the thoracolumbar spine. Trace retrolisthesis of T12 on L1 and L1 on L2, which are new compared to prior. 5 mm retrolisthesis of L2 on L3, also new compared to prior. Decreased anterolisthesis of L3 on L4. No residual anterolisthesis is seen at L4-L5. 5 mm anterolisthesis L5 on S1 is new from the prior exam.   Vertebrae: Status post L3-L5 fusion and decompression. Susceptibility artifact from the hardware limits evaluation of the levels. Within this limitation, no acute fracture or suspicious osseous lesion.   Conus medullaris and cauda equina: Conus extends to the L2 level. Conus and cauda equina appear normal, although evaluation of the postoperative levels is limited by susceptibility artifact.   Paraspinal and other soft tissues: Atrophy of the inferior paraspinous muscles.   Disc levels:   T11-T12: Seen only on the sagittal images. Minimal disc bulge. No spinal canal stenosis or neural foraminal narrowing.   T12-L1: Trace retrolisthesis and minimal disc bulge. Mild facet arthropathy. No spinal canal stenosis or neural foraminal narrowing.   L1-L2: Trace retrolisthesis and minimal disc bulge. Mild facet arthropathy. No spinal canal stenosis or neural foraminal narrowing.   L2-L3: 5 mm retrolisthesis. Moderate facet arthropathy. Ligamentum flavum hypertrophy. Moderate spinal canal stenosis. Effacement of the lateral recesses, likely compressing the descending L3 nerve roots. Mild left neural foraminal narrowing, although evaluation of the neural foramina is somewhat limited by susceptibility artifact from adjacent hardware.    L3-L4: Status post fusion and decompression. Grade 1 anterolisthesis with disc unroofing. No spinal canal stenosis or neural foraminal narrowing, although evaluation of the neural foramina is somewhat limited by susceptibility artifact.   L4-L5: Status post fusion and decompression. No spinal canal stenosis or definite neural foraminal narrowing, although evaluation of the left neural foramen is limited by susceptibility artifact.   L5-S1: 5 mm anterolisthesis with disc unroofing. Mild facet arthropathy. No spinal canal stenosis or neural foraminal narrowing, although evaluation of the neural foramina is somewhat limited by susceptibility artifact.   IMPRESSION: 1. Status post L3-L5 fusion and decompression, with no evidence of residual spinal canal stenosis or neural foraminal narrowing at these levels. 2. L2-L3 moderate spinal canal stenosis and mild left neural foraminal narrowing, which are new from the prior exam. Effacement of the lateral recesses at this level likely compresses the descending L3 nerve roots.     Electronically Signed   By: Wiliam Ke M.D.   On: 08/02/2022 19:12   PATIENT SURVEYS:  FOTO NT    SCREENING FOR RED FLAGS: Bowel or bladder incontinence: No Spinal tumors: No Cauda equina syndrome: No Compression fracture: No Abdominal aneurysm: No   COGNITION: Overall cognitive status: Within functional limits for tasks assessed  SENSATION: WFL   MUSCLE LENGTH: Hamstrings: Right 90 deg; Left 90 deg Thomas test: Right 0 deg; Left 0 deg   POSTURE: rounded shoulders   PALPATION: Lumbar, L1-L5 pain radiating outwards to paraspinals    LUMBAR ROM:    AROM eval  Flexion 75%  Extension 85%  Right lateral flexion 75%  Left lateral flexion 75%*  Right rotation 75%  Left rotation 75%   (Blank rows = not tested)   LOWER EXTREMITY ROM:          Active  Right 03/11/2023 Left 03/11/2023  Hip flexion 120 120  Hip  extension 30 30  Hip abduction      Hip adduction      Hip internal rotation      Hip external rotation      Knee flexion 135 135  Knee extension 0 0  Ankle dorsiflexion 20 20  Ankle plantarflexion      Ankle inversion      Ankle eversion       (Blank rows = not tested)        LOWER EXTREMITY MMT:     MMT Right eval Left eval  Hip flexion 4- 4-  Hip extension NT NT  Hip abduction 3+ 3+  Hip adduction 3+ 3+  Hip internal rotation NT NT  Hip external rotation NT NT   Knee flexion 4  4  Knee extension 4+ 4+  Ankle dorsiflexion 4+ 4+  Ankle plantarflexion      Ankle inversion      Ankle eversion       (Blank rows = not tested)   LUMBAR SPECIAL TESTS:  Straight leg raise test: Positive, FABER test: Positive, and Thomas test: Positive   FUNCTIONAL TESTS:  5 times sit to stand: 15 sec  30 seconds chair stand test 9 reps    GAIT: Distance walked: 50 ft  Assistive device utilized: None Level of assistance: Complete Independence Comments: Antalgic gait on right side with decrease stand time on right and step length on  left    TODAY'S TREATMENT:                                                                                                                              DATE:   04/29/23: Pt using SPC for all ambulation  Nu-Step seat and arms at 7 with level 3 resistance for 5 min  Fast walk 10 m with ambulation x 4  Fast and slow walk 10 m with ambulation x 4  Seated Hip Abduction with Blue Band 1 x 10  Standing Hip Abduction with BUE support 2 x 10  Mini-Squat 3 x 10  -min VC to maintain    04/24/23: Nu-Step seat and arms at 7 with level 3 resistance for 5 min  OMEGA Leg Press #55 with seat at 3-  3 x 10  Seated Hip Abduction with Blue Band 3 x 10  Mini-Squat with red yoga  ball 1 x 10 Mini-Squat with red yoga ball #4 Db  2 x 10   Descending Marches 1 x 10 -Pt reports increased pain in low back  Supine Marches with knee extension 2 x 10  -mod VC due to pt's  difficulty coordinating exercise   04/20/23: Nu-Step seat and arms at 8 with level 3 resistance for 5 min  10 m fast walk x 8 10 m 3 ankle weight step over x 10  10 m x 3 1 ft obstacles (yellow) x 10  Mini-Squat with BUE support 3 x 10 -min VC to decrease speed of lowering  Supine HS Stretch R/L 70 deg/ 70 deg   Seated HS Stretch R/L 2 X 60 sec  04/18/23: Nu-step seat and arms at 7 with level 3 resistance for 5 min  OMEGA Leg Press #35 2 x 10 -min VC to decrease speed of eccentric phase  Modified Thomas Stretch 6 x 30 sec (Pt providing some overpressure) -Pt reports feeling pain on lateral side of hip and pulling along anterior surface  Supine Bridges 3 x 10  -min VC to decrease eccentric phase  Clam Shells 1 x 10  Clam Shells with yellow band 2 x 10    PATIENT EDUCATION:  Education details: form and technique for correct performance of exercise  Person educated: Patient Education method: Programmer, multimedia, Demonstration, Verbal cues, and Handouts Education comprehension: verbalized understanding and returned demonstration   HOME EXERCISE PROGRAM: Access Code: [email or web address] URL: [email or web address] Date: 04/29/2023 Prepared by: Ellin Goodie  Exercises - Supine Lower Trunk Rotation  - 1 x daily - 3 sets - 10 reps - Seated Hamstring Stretch  - 1 x daily - 3 reps - 30-60 sec  hold - Modified Thomas Stretch  - 1 x daily - 3 reps - 60 sec  hold - Mini Squat  - 3-4 x weekly - 3 sets - 10 reps - Supine March  - 3-4 x weekly - 3 sets - 10 reps - Standing Hip Abduction with Counter Support  - 3-4 x weekly - 3 sets - 10 reps  ASSESSMENT:   CLINICAL IMPRESSION:  Pt shows improvement with LE strength with ability to perform standing hip abduction and mini-squats. She does require minimal verbal cues to perform exercises correctly. She is nearing end of plan of care and she continues to progress towards her goals. She will continue to benefit from skilled therapy to address  remaining deficits in order to improve quality of life and return to PLOF.   OBJECTIVE IMPAIRMENTS: Abnormal gait, decreased balance, decreased endurance, difficulty walking, decreased strength, hypomobility, impaired flexibility, and pain.    ACTIVITY LIMITATIONS: carrying, lifting, standing, and locomotion level   PARTICIPATION LIMITATIONS: cleaning, laundry, shopping, community activity, yard work, and church   PERSONAL FACTORS: Age, Past/current experiences, and Time since onset of injury/illness/exacerbation are also affecting patient's functional outcome.    REHAB POTENTIAL: Fair chronicity of condition and past failed treatents    CLINICAL DECISION MAKING: Stable/uncomplicated   EVALUATION COMPLEXITY: Low     GOALS: Goals reviewed with patient? No   SHORT TERM GOALS: Target date: 03/25/2023   Pt will be independent with HEP in order to improve strength and balance in order to decrease fall risk and improve function at home and work. Baseline: NT 04/02/23: Able to perform independently  Goal status: MET      LONG TERM GOALS: Target date: 05/20/2023   Patient will have improved function and activity level as evidenced by an  increase in FOTO score by 10 points or more.  Baseline: 44 04/02/23: 53  Goal status: MET   2.  Patient will perform five sit to stand repetitions in <=15 secs as evidence of LE strength that shows that this community dwelling older adult that is older is at a decreased risk of falling. (Buatois 2010)   Baseline:  15 sec 04/02/23: 12 sec  Goal status: MET   3.  Patient will demonstrate improved LE endurance with completion of >=10 reps as indication of decrease risk of falling for age and gender matched norms to remain independent and avoid serious injury from falling. Baseline: 9 reps 04/02/23: 10 reps   Goal status: MET   4.  Pt will decrease 5TSTS by at least 3 seconds in order to demonstrate clinically significant improvement in LE strength Baseline:  15 sec 04/02/23: 12 sec    Goal status: MET   5.  Pt will increase by at least 12.2 m (40 ft) in order to demonstrate clinically significant improvement in cardiopulmonary endurance and community ambulation Baseline: 242 ft  04/04/23: 300 ft  Goal status: MET    6.  Patient will improve hip and abdominal strength MMT by 1/3 grade MMT (ie 4- to 4) as evidence of improved spinal stability to decrease neurogenic claudication and improve walking distance without being limited by pain.   Baseline: Hip Flex R/L 4-/4-, Hip Abd R/L 3+/3+, Hip Add R/L 3+/3+, Hip Ext 4+/4+ 04/16/23: Hip Flex R/L 4/4, Hip Abd R/L 3+/4-, Hip Ext R/L 3+/2+ Goal status: Ongoing   7. Patient will show decreased risk of falling as evidence by decrease in self-selected gait speed by 0.12 m/sec.  Baseline: 0.73 m/sec 04/16/23: 0.73 m/sec  Goal status: Ongoing     PLAN:   PT FREQUENCY: 1-2x/week   PT DURATION: 10 weeks   PLANNED INTERVENTIONS: Therapeutic exercises, Therapeutic activity, Neuromuscular re-education, Balance training, Gait training, Joint mobilization, Joint manipulation, Stair training, Aquatic Therapy, Dry Needling, Electrical stimulation, Spinal manipulation, Spinal mobilization, Cryotherapy, Moist heat, Manual therapy, and Re-evaluation.   PLAN FOR NEXT SESSION: TM warm up. Continue to progress hip abduction and extension strengthening exercises and dynamic balance exercises: walking with head turns and changing speed and weight mini-squat and hip abduction   Ellin Goodie PT, DPT  Physical Therapist - [facility] 04/24/2023, 11:18 AM

## 2023-05-01 ENCOUNTER — Ambulatory Visit: Payer: PPO | Admitting: Physical Therapy

## 2023-05-01 ENCOUNTER — Encounter: Payer: Self-pay | Admitting: Physical Therapy

## 2023-05-01 ENCOUNTER — Other Ambulatory Visit: Payer: Self-pay | Admitting: Family Medicine

## 2023-05-01 DIAGNOSIS — R262 Difficulty in walking, not elsewhere classified: Secondary | ICD-10-CM

## 2023-05-01 DIAGNOSIS — M5459 Other low back pain: Secondary | ICD-10-CM

## 2023-05-01 DIAGNOSIS — Z1231 Encounter for screening mammogram for malignant neoplasm of breast: Secondary | ICD-10-CM

## 2023-05-01 NOTE — Therapy (Addendum)
OUTPATIENT PHYSICAL THERAPY DISCHARGE NOTE   Patient Name: Brittney Ramsey MRN: [id number] DOB:1946/02/14, 77 y.o., female Today's Date: 05/01/2023  PCP: Dr. Larwance Sachs  REFERRING PROVIDER: Burman Freestone FNP   END OF SESSION:   PT End of Session - 05/01/23 1038     Visit Number 15    Number of Visits 20    Date for PT Re-Evaluation 05/20/23    Authorization Type HTA [id number]    Authorization - Number of Visits 15    Progress Note Due on Visit 15    PT Start Time 1035    PT Stop Time 1115    PT Time Calculation (min) 40 min    Activity Tolerance Patient tolerated treatment well    Behavior During Therapy WFL for tasks assessed/performed                   Past Medical History:  Diagnosis Date   Allergic rhinitis    Allergy    Anxiety    Arthritis    Complication of anesthesia    Hyperlipemia    Hypertension    Neuropathy    legs   PONV (postoperative nausea and vomiting)    Past Surgical History:  Procedure Laterality Date   CATARACT EXTRACTION W/PHACO Right 01/14/2023   Procedure: CATARACT EXTRACTION PHACO AND INTRAOCULAR LENS PLACEMENT (IOC) RIGHT  20.00  01:36.9;  Surgeon: Estanislado Pandy, MD;  Location: [facility];  Service: Ophthalmology;  Laterality: Right;   CATARACT EXTRACTION W/PHACO Left 01/24/2023   Procedure: CATARACT EXTRACTION PHACO AND INTRAOCULAR LENS PLACEMENT (IOC) LEFT  10.51  00:49.3;  Surgeon: Estanislado Pandy, MD;  Location: [facility];  Service: Ophthalmology;  Laterality: Left;   COLONOSCOPY     ESOPHAGEAL MANOMETRY N/A 12/21/2015   Procedure: ESOPHAGEAL MANOMETRY (EM);  Surgeon: Elnita Maxwell, MD;  Location: [facility] ENDOSCOPY;  Service: Endoscopy;  Laterality: N/A;   ESOPHAGOGASTRODUODENOSCOPY (EGD) WITH PROPOFOL N/A 11/04/2015   Procedure: ESOPHAGOGASTRODUODENOSCOPY (EGD) WITH PROPOFOL;  Surgeon: Wallace Cullens, MD;  Location: [facility] ENDOSCOPY;  Service: Endoscopy;  Laterality: N/A;   HIP ARTHROPLASTY Right 08/03/2019    Procedure: ARTHROPLASTY BIPOLAR HIP (HEMIARTHROPLASTY);  Surgeon: Donato Heinz, MD;  Location: [facility] ORS;  Service: Orthopedics;  Laterality: Right;   L3-4, L4-5 decompression and fusion  10/20/2018   Dr. Delma Officer   TUBAL LIGATION     Patient Active Problem List   Diagnosis Date Noted   HTN (hypertension) 08/02/2019   HLD (hyperlipidemia) 08/02/2019   Hip fracture, right (HCC) 08/02/2019   Hip fracture (HCC) 08/02/2019   Spondylolisthesis of lumbar region 10/20/2018   Hyponatremia 11/02/2015    REFERRING DIAG: DDD Lumbar   THERAPY DIAG:  Other low back pain  Difficulty in walking, not elsewhere classified. Rationale for Evaluation and Treatment Rehabilitation  PERTINENT HISTORY: Pt reports that she had a prior lumbar fusion in 2019 and her lower back pain improved somewhat. However, it has been getting steadily worse and she has already received three steroid injections. She also has also gotten a partial right hip replacement shortly after the back surgery because she fell and shattered her right hip. She reports having instability especially when walking fast and going up and downstairs. She wants to be able walk longer distances to be able to retrieve mail from her mailbox.   PRECAUTIONS: Falls risk   SUBJECTIVE:  SUBJECTIVE STATEMENT: Pt reports ongoing relief for her low back pain.   PAIN:  Are you having pain? No   OBJECTIVE: (objective measures completed at initial evaluation unless otherwise dated)  VITALS:bp 135/57 hr 81 SpO2 100   DIAGNOSTIC FINDINGS:  CLINICAL DATA:  Low back pain radiates down posterior right hip into leg   EXAM: MRI LUMBAR SPINE WITHOUT CONTRAST   TECHNIQUE: Multiplanar, multisequence MR imaging of the lumbar spine was performed. No intravenous contrast  was administered.   COMPARISON:  08/10/2018 MRI lumbar spine, correlation is also made with 09/01/2019 lumbar spine radiographs   FINDINGS: Segmentation:  5 lumbar-type vertebral bodies.   Alignment: S shaped curvature of the thoracolumbar spine. Trace retrolisthesis of T12 on L1 and L1 on L2, which are new compared to prior. 5 mm retrolisthesis of L2 on L3, also new compared to prior. Decreased anterolisthesis of L3 on L4. No residual anterolisthesis is seen at L4-L5. 5 mm anterolisthesis L5 on S1 is new from the prior exam.   Vertebrae: Status post L3-L5 fusion and decompression. Susceptibility artifact from the hardware limits evaluation of the levels. Within this limitation, no acute fracture or suspicious osseous lesion.   Conus medullaris and cauda equina: Conus extends to the L2 level. Conus and cauda equina appear normal, although evaluation of the postoperative levels is limited by susceptibility artifact.   Paraspinal and other soft tissues: Atrophy of the inferior paraspinous muscles.   Disc levels:   T11-T12: Seen only on the sagittal images. Minimal disc bulge. No spinal canal stenosis or neural foraminal narrowing.   T12-L1: Trace retrolisthesis and minimal disc bulge. Mild facet arthropathy. No spinal canal stenosis or neural foraminal narrowing.   L1-L2: Trace retrolisthesis and minimal disc bulge. Mild facet arthropathy. No spinal canal stenosis or neural foraminal narrowing.   L2-L3: 5 mm retrolisthesis. Moderate facet arthropathy. Ligamentum flavum hypertrophy. Moderate spinal canal stenosis. Effacement of the lateral recesses, likely compressing the descending L3 nerve roots. Mild left neural foraminal narrowing, although evaluation of the neural foramina is somewhat limited by susceptibility artifact from adjacent hardware.   L3-L4: Status post fusion and decompression. Grade 1 anterolisthesis with disc unroofing. No spinal canal stenosis or neural  foraminal narrowing, although evaluation of the neural foramina is somewhat limited by susceptibility artifact.   L4-L5: Status post fusion and decompression. No spinal canal stenosis or definite neural foraminal narrowing, although evaluation of the left neural foramen is limited by susceptibility artifact.   L5-S1: 5 mm anterolisthesis with disc unroofing. Mild facet arthropathy. No spinal canal stenosis or neural foraminal narrowing, although evaluation of the neural foramina is somewhat limited by susceptibility artifact.   IMPRESSION: 1. Status post L3-L5 fusion and decompression, with no evidence of residual spinal canal stenosis or neural foraminal narrowing at these levels. 2. L2-L3 moderate spinal canal stenosis and mild left neural foraminal narrowing, which are new from the prior exam. Effacement of the lateral recesses at this level likely compresses the descending L3 nerve roots.     Electronically Signed   By: Wiliam Ke M.D.   On: 08/02/2022 19:12   PATIENT SURVEYS:  FOTO NT    SCREENING FOR RED FLAGS: Bowel or bladder incontinence: No Spinal tumors: No Cauda equina syndrome: No Compression fracture: No Abdominal aneurysm: No   COGNITION: Overall cognitive status: Within functional limits for tasks assessed                          SENSATION:  WFL   MUSCLE LENGTH: Hamstrings: Right 90 deg; Left 90 deg Thomas test: Right 0 deg; Left 0 deg   POSTURE: rounded shoulders   PALPATION: Lumbar, L1-L5 pain radiating outwards to paraspinals    LUMBAR ROM:    AROM eval  Flexion 75%  Extension 85%  Right lateral flexion 75%  Left lateral flexion 75%*  Right rotation 75%  Left rotation 75%   (Blank rows = not tested)   LOWER EXTREMITY ROM:          Active  Right 03/11/2023 Left 03/11/2023  Hip flexion 120 120  Hip extension 30 30  Hip abduction      Hip adduction      Hip internal rotation      Hip external rotation      Knee flexion 135  135  Knee extension 0 0  Ankle dorsiflexion 20 20  Ankle plantarflexion      Ankle inversion      Ankle eversion       (Blank rows = not tested)        LOWER EXTREMITY MMT:     MMT Right eval Left eval  Hip flexion 4- 4-  Hip extension NT NT  Hip abduction 3+ 3+  Hip adduction 3+ 3+  Hip internal rotation NT NT  Hip external rotation NT NT   Knee flexion 4  4  Knee extension 4+ 4+  Ankle dorsiflexion 4+ 4+  Ankle plantarflexion      Ankle inversion      Ankle eversion       (Blank rows = not tested)   LUMBAR SPECIAL TESTS:  Straight leg raise test: Positive, FABER test: Positive, and Thomas test: Positive   FUNCTIONAL TESTS:  5 times sit to stand: 15 sec  30 seconds chair stand test 9 reps    GAIT: Distance walked: 50 ft  Assistive device utilized: None Level of assistance: Complete Independence Comments: Antalgic gait on right side with decrease stand time on right and step length on  left    TODAY'S TREATMENT:                                                                                                                              DATE:   05/01/23: Pt using SPC for all ambulation  Nu-Step seat and arms at 7 with level 3 resistance for 5 min  Gait Speed: Attempt 1: 10.61 sec , Attempt 2: 10.97 sec Average: 10.79 10 m/10.79 sec= 0.93 m/sec Hip MMT: -Flex R/L 4/4  -Hip Abd R/L 4-/4-   Educated on HEP and progressions: Standing Hip Marches 2 x 10 with BUE support  Mini-Squat while hold water jug 1 x 10  -min VC to maintain knees behind toes   04/29/23: Pt using SPC for all ambulation  Nu-Step seat and arms at 7 with level 3 resistance for 5 min  Fast walk 10 m with ambulation x 4  Fast  and slow walk 10 m with ambulation x 4  Seated Hip Abduction with Blue Band 1 x 10  Standing Hip Abduction with BUE support 2 x 10  Mini-Squat 3 x 10  -min VC to maintain    04/24/23: Nu-Step seat and arms at 7 with level 3 resistance for 5 min  OMEGA Leg Press #55  with seat at 3-  3 x 10  Seated Hip Abduction with Blue Band 3 x 10  Mini-Squat with red yoga ball 1 x 10 Mini-Squat with red yoga ball #4 Db  2 x 10   Descending Marches 1 x 10 -Pt reports increased pain in low back  Supine Marches with knee extension 2 x 10  -mod VC due to pt's difficulty coordinating exercise   PATIENT EDUCATION:  Education details: form and technique for correct performance of exercise  Person educated: Patient Education method: Explanation, Demonstration, Verbal cues, and Handouts Education comprehension: verbalized understanding and returned demonstration   HOME EXERCISE PROGRAM: Access Code: [email or web address] URL: [email or web address] Date: 05/01/2023 Prepared by: Ellin Goodie  Exercises - Supine Lower Trunk Rotation  - 1 x daily - 3 sets - 10 reps - Supine Single Knee to Chest Stretch  - 1 x daily - 10 reps - 3 sec  hold - Seated Hamstring Stretch  - 1 x daily - 3 reps - 30-60 sec  hold - Modified Thomas Stretch  - 1 x daily - 3 reps - 60 sec  hold - Mini Squat  - 3-4 x weekly - 3 sets - 10 reps - Standing Hip Abduction with Counter Support  - 3-4 x weekly - 3 sets - 10 reps - Standing March with Counter Support  - 3-4 x weekly - 3 sets - 10 reps  ASSESSMENT:   CLINICAL IMPRESSION:  Pt has now met all her rehab goals with an improvement in gait speed and hip strength. She has demonstrated understanding of HEP and progressions and she will continue to perform HEP to maintain functional gains. Pt is ready for discharge from PT.  OBJECTIVE IMPAIRMENTS: Abnormal gait, decreased balance, decreased endurance, difficulty walking, decreased strength, hypomobility, impaired flexibility, and pain.    ACTIVITY LIMITATIONS: carrying, lifting, standing, and locomotion level   PARTICIPATION LIMITATIONS: cleaning, laundry, shopping, community activity, yard work, and church   PERSONAL FACTORS: Age, Past/current experiences, and Time since onset of  injury/illness/exacerbation are also affecting patient's functional outcome.    REHAB POTENTIAL: Fair chronicity of condition and past failed treatents    CLINICAL DECISION MAKING: Stable/uncomplicated   EVALUATION COMPLEXITY: Low     GOALS: Goals reviewed with patient? No   SHORT TERM GOALS: Target date: 03/25/2023   Pt will be independent with HEP in order to improve strength and balance in order to decrease fall risk and improve function at home and work. Baseline: NT 04/02/23: Able to perform independently  Goal status: MET      LONG TERM GOALS: Target date: 05/20/2023   Patient will have improved function and activity level as evidenced by an increase in FOTO score by 10 points or more.  Baseline: 44 04/02/23: 53  Goal status: MET   2.  Patient will perform five sit to stand repetitions in <=15 secs as evidence of LE strength that shows that this community dwelling older adult that is older is at a decreased risk of falling. (Buatois 2010)   Baseline:  15 sec 04/02/23: 12 sec  Goal status: MET   3.  Patient will demonstrate  improved LE endurance with completion of >=10 reps as indication of decrease risk of falling for age and gender matched norms to remain independent and avoid serious injury from falling. Baseline: 9 reps 04/02/23: 10 reps   Goal status: MET   4.  Pt will decrease 5TSTS by at least 3 seconds in order to demonstrate clinically significant improvement in LE strength Baseline: 15 sec 04/02/23: 12 sec    Goal status: MET   5.  Pt will increase by at least 12.2 m (40 ft) in order to demonstrate clinically significant improvement in cardiopulmonary endurance and community ambulation Baseline: 242 ft  04/04/23: 300 ft  Goal status: MET    6.  Patient will improve hip and abdominal strength MMT by 1/3 grade MMT (ie 4- to 4) as evidence of improved spinal stability to decrease neurogenic claudication and improve walking distance without being limited by pain.    Baseline: Hip Flex R/L 4-/4-, Hip Abd R/L 3+/3+, Hip Add R/L 3+/3+, Hip Ext 4+/4+ 04/16/23: Hip Flex R/L 4/4, Hip Abd R/L 3+/4-, Hip Ext R/L 3+/2+ 05/01/23: Hip Flex R/L 4/4, Hip Abd R/L 4-/4-  Goal status: Achieved   7. Patient will show decreased risk of falling as evidence by decrease in self-selected gait speed by 0.12 m/sec.  Baseline: 0.73 m/sec 04/16/23: 0.73 m/sec 05/01/23: 0.93 Goal status: Achieved     PLAN:   PT FREQUENCY: 1-2x/week   PT DURATION: 10 weeks   PLANNED INTERVENTIONS: Therapeutic exercises, Therapeutic activity, Neuromuscular re-education, Balance training, Gait training, Joint mobilization, Joint manipulation, Stair training, Aquatic Therapy, Dry Needling, Electrical stimulation, Spinal manipulation, Spinal mobilization, Cryotherapy, Moist heat, Manual therapy, and Re-evaluation.   PLAN FOR NEXT SESSION: Discharge form PT   Ellin Goodie PT, DPT  Physical Therapist - [facility]  [facility] 05/01/2023, 10:40 AM

## 2023-05-06 ENCOUNTER — Ambulatory Visit: Payer: PPO | Admitting: Physical Therapy

## 2023-05-08 ENCOUNTER — Encounter: Payer: PPO | Admitting: Physical Therapy

## 2023-05-13 ENCOUNTER — Encounter: Payer: PPO | Admitting: Physical Therapy

## 2023-05-15 ENCOUNTER — Encounter: Payer: PPO | Admitting: Physical Therapy

## 2023-05-23 ENCOUNTER — Ambulatory Visit
Admission: EM | Admit: 2023-05-23 | Discharge: 2023-05-23 | Disposition: A | Discharge status: Home / Self Care | Payer: PPO | Attending: Urgent Care | Admitting: Urgent Care

## 2023-05-23 ENCOUNTER — Encounter: Payer: Self-pay | Admitting: Emergency Medicine

## 2023-05-23 DIAGNOSIS — R0602 Shortness of breath: Secondary | ICD-10-CM | POA: Diagnosis not present

## 2023-05-23 DIAGNOSIS — R6889 Other general symptoms and signs: Secondary | ICD-10-CM

## 2023-05-23 MED ORDER — DEXAMETHASONE SODIUM PHOSPHATE 10 MG/ML IJ SOLN
10.0000 mg | Freq: Once | INTRAMUSCULAR | Status: AC
  Administered 2023-05-23: 10 mg via INTRAMUSCULAR

## 2023-05-23 MED ORDER — IPRATROPIUM BROMIDE 0.02 % IN SOLN
0.5000 mg | Freq: Once | RESPIRATORY_TRACT | Status: AC
  Administered 2023-05-23: 0.5 mg via RESPIRATORY_TRACT

## 2023-05-23 MED ORDER — ALBUTEROL SULFATE (2.5 MG/3ML) 0.083% IN NEBU
2.5000 mg | INHALATION_SOLUTION | Freq: Once | RESPIRATORY_TRACT | Status: AC
  Administered 2023-05-23: 2.5 mg via RESPIRATORY_TRACT

## 2023-05-23 NOTE — Discharge Instructions (Signed)
Go to the emergency room if your symptoms of shortness of breath worsen or if you have difficulty breathing.

## 2023-05-23 NOTE — ED Triage Notes (Signed)
Reports new weakness, cough, fever x 3 days.  Reports headache, lack of appetite, PND, bilateral ear pain, SOB, fatigue, nausea. Denies eye pain, sore throat, CP, abdominal pain, changes to bowel or bladder habits, sick contacts.  Was taking zyrtec and flonase with no improvment

## 2023-05-23 NOTE — ED Provider Notes (Signed)
Renaldo Fiddler    CSN: [id number] Arrival date & time: 05/23/23  4098      History   Chief Complaint Chief Complaint  Patient presents with   Weakness   Cough    HPI Brittney Ramsey is a 77 y.o. female.    Weakness Associated symptoms: cough   Cough  Presents to urgent care with complaint of symptoms x 3 days.  She reports weakness, cough, fever, headache, lack of appetite, postnasal drip, bilateral ear pain, shortness of breath, fatigue, nausea.  Denies sore throat, chest pain, abdominal pain.  No changes to bowel or bladder.  Denies any sick contacts.  Using Zyrtec and Flonase without improvement.  She denies history of chronic respiratory illness but does states that she has an albuterol inhaler which she uses at home for occasional "wheezing".  Review of the patient's chart shows multiple visits in March through June where she complained of shortness of breath, especially when walking for which an albuterol inhaler was ordered by her PCP.  Past Medical History:  Diagnosis Date   Allergic rhinitis    Allergy    Anxiety    Arthritis    Complication of anesthesia    Hyperlipemia    Hypertension    Neuropathy    legs   PONV (postoperative nausea and vomiting)     Patient Active Problem List   Diagnosis Date Noted   HTN (hypertension) 08/02/2019   HLD (hyperlipidemia) 08/02/2019   Hip fracture, right (HCC) 08/02/2019   Hip fracture (HCC) 08/02/2019   Spondylolisthesis of lumbar region 10/20/2018   Hyponatremia 11/02/2015    Past Surgical History:  Procedure Laterality Date   CATARACT EXTRACTION W/PHACO Right 01/14/2023   Procedure: CATARACT EXTRACTION PHACO AND INTRAOCULAR LENS PLACEMENT (IOC) RIGHT  20.00  01:36.9;  Surgeon: Estanislado Pandy, MD;  Location: [facility];  Service: Ophthalmology;  Laterality: Right;   CATARACT EXTRACTION W/PHACO Left 01/24/2023   Procedure: CATARACT EXTRACTION PHACO AND INTRAOCULAR LENS PLACEMENT (IOC)  LEFT  10.51  00:49.3;  Surgeon: Estanislado Pandy, MD;  Location: [facility];  Service: Ophthalmology;  Laterality: Left;   COLONOSCOPY     ESOPHAGEAL MANOMETRY N/A 12/21/2015   Procedure: ESOPHAGEAL MANOMETRY (EM);  Surgeon: Elnita Maxwell, MD;  Location: [facility] ENDOSCOPY;  Service: Endoscopy;  Laterality: N/A;   ESOPHAGOGASTRODUODENOSCOPY (EGD) WITH PROPOFOL N/A 11/04/2015   Procedure: ESOPHAGOGASTRODUODENOSCOPY (EGD) WITH PROPOFOL;  Surgeon: Wallace Cullens, MD;  Location: [facility] ENDOSCOPY;  Service: Endoscopy;  Laterality: N/A;   HIP ARTHROPLASTY Right 08/03/2019   Procedure: ARTHROPLASTY BIPOLAR HIP (HEMIARTHROPLASTY);  Surgeon: Donato Heinz, MD;  Location: [facility] ORS;  Service: Orthopedics;  Laterality: Right;   L3-4, L4-5 decompression and fusion  10/20/2018   Dr. Delma Officer   TUBAL LIGATION      OB History   No obstetric history on file.      Home Medications    Prior to Admission medications   Medication Sig Start Date End Date Taking? Authorizing Provider  acetaminophen (TYLENOL) 500 MG tablet Take 1,000 mg by mouth daily as needed for moderate pain or headache.    [provider]  albuterol (VENTOLIN HFA) 108 (90 Base) MCG/ACT inhaler Inhale into the lungs every 6 (six) hours as needed for wheezing or shortness of breath.    [provider]  busPIRone (BUSPAR) 10 MG tablet Take 10 mg by mouth daily.    [provider]  Carboxymethylcellul-Glycerin (LUBRICATING EYE DROPS OP) Place 1 drop into  both eyes daily as needed (dry eyes).    [provider]  Cholecalciferol (VITAMIN D-3) 125 MCG (5000 UT) TABS Take by mouth daily.    [provider]  Coenzyme Q10 (COQ10 PO) Take by mouth daily.    [provider]  Cyanocobalamin (VITAMIN B-12 PO) Take by mouth daily.    [provider]  diclofenac Sodium (VOLTAREN) 1 % GEL Apply topically as needed.    [provider]  docusate sodium (COLACE) 100 MG  capsule Take 1 capsule (100 mg total) by mouth 2 (two) times daily. 10/22/18   Tressie Stalker, MD  DULoxetine (CYMBALTA) 60 MG capsule Take 60 mg by mouth 2 (two) times daily.    [provider]  fluticasone (FLONASE) 50 MCG/ACT nasal spray Place 2 sprays into both nostrils daily as needed for allergies.     [provider]  gabapentin (NEURONTIN) 100 MG capsule Take 100 mg by mouth 3 (three) times daily as needed.    [provider]  Lidocaine 4 % PTCH Apply 1 patch topically daily as needed (pain).    [provider]  LORazepam (ATIVAN) 0.5 MG tablet Take 0.5 mg by mouth at bedtime.     [provider]  losartan-hydrochlorothiazide (HYZAAR) 100-25 MG tablet Take 1 tablet by mouth daily.    [provider]  meloxicam (MOBIC) 7.5 MG tablet Take 7.5 mg by mouth 2 (two) times daily.    [provider]  Misc Natural Products (OSTEO BI-FLEX ADV TRIPLE ST) TABS Take 1 tablet by mouth daily.    [provider]  montelukast (SINGULAIR) 10 MG tablet Take 10 mg by mouth daily.    [provider]  Omega-3 Fatty Acids (FISH OIL PO) Take by mouth daily.    [provider]  pantoprazole (PROTONIX) 40 MG tablet Take 1 tablet (40 mg total) by mouth daily. 11/04/15   Houston Siren, MD  polyethylene glycol (MIRALAX / GLYCOLAX) packet Take 17 g by mouth daily as needed.    [provider]  Probiotic CAPS Take 1 capsule by mouth daily.    [provider]  simvastatin (ZOCOR) 20 MG tablet Take 20 mg by mouth daily at 6 PM.    [provider]  traMADol (ULTRAM) 50 MG tablet Take 50 mg by mouth See admin instructions. Take 50 mg daily, may take a second 50 mg dose as needed for pain    [provider]    Family History Family History  Problem Relation Age of Onset   Breast cancer Neg Hx     Social History Social History   Tobacco Use   Smoking status: Former    Types: Cigarettes    Smokeless tobacco: Never   Tobacco comments:    Smoked for about 5 years in her 43s  Vaping Use   Vaping status: Never Used  Substance Use Topics   Alcohol use: No   Drug use: Never     Allergies   Flagyl [metronidazole], Biaxin [clarithromycin], and Erythromycin   Review of Systems Review of Systems  Respiratory:  Positive for cough.   Neurological:  Positive for weakness.     Physical Exam Triage Vital Signs ED Triage Vitals  Encounter Vitals Group     BP 05/23/23 0929 (!) 160/76     Systolic BP Percentile --      Diastolic BP Percentile --      Pulse Rate 05/23/23 0929 66     Resp 05/23/23  0929 18     Temp 05/23/23 0929 99.4 F (37.4 C)     Temp Source 05/23/23 0929 Oral     SpO2 05/23/23 0929 93 %     Weight --      Height --      Head Circumference --      Peak Flow --      Pain Score 05/23/23 0933 0     Pain Loc --      Pain Education --      Exclude from Growth Chart --    No data found.  Updated Vital Signs BP (!) 160/76 (BP Location: Left Arm)   Pulse 66   Temp 99.4 F (37.4 C) (Oral)   Resp 18   SpO2 93%   Visual Acuity Right Eye Distance:   Left Eye Distance:   Bilateral Distance:    Right Eye Near:   Left Eye Near:    Bilateral Near:     Physical Exam Vitals reviewed.  Constitutional:      Appearance: Normal appearance. She is not ill-appearing.  HENT:     Right Ear: Tympanic membrane normal.     Left Ear: Tympanic membrane normal.     Nose: No congestion.     Mouth/Throat:     Pharynx: No oropharyngeal exudate or posterior oropharyngeal erythema.  Cardiovascular:     Rate and Rhythm: Normal rate and regular rhythm.     Pulses: Normal pulses.     Heart sounds: Normal heart sounds.  Pulmonary:     Effort: Pulmonary effort is normal.     Breath sounds: Decreased breath sounds present.  Skin:    General: Skin is warm and dry.  Neurological:     General: No focal deficit present.     Mental Status: She is alert and  oriented to person, place, and time.  Psychiatric:        Mood and Affect: Mood normal.        Behavior: Behavior normal.      UC Treatments / Results  Labs (all labs ordered are listed, but only abnormal results are displayed) Labs Reviewed - No data to display  EKG   Radiology No results found.  Procedures Procedures (including critical care time)  Medications Ordered in UC Medications - No data to display  Initial Impression / Assessment and Plan / UC Course  I have reviewed the triage vital signs and the nursing notes.  Pertinent labs & imaging results that were available during my care of the patient were reviewed by me and considered in my medical decision making (see chart for details).   Brittney Ramsey is a 77 y.o. female presenting with flu-like symptoms. Patient is afebrile without recent antipyretics, satting 93% on room air. Overall is well appearing, well hydrated, without respiratory distress. Pulmonary exam is remarkable for decreased breath sounds.  Lungs CTAB without wheezing, rhonchi, rales. RRR without murmurs, rubs, gallops.  Mild pharyngeal erythema. No peritonsillar exudates.  TMs are WNL bilaterally.  Reviewed relevant chart history.   Patient's symptoms are consistent with an acute viral process, possibly COVID.  Swab obtained and results pending.  I am concerned for possible underlying COPD given her recent complaints of shortness of breath as well as worsening symptoms today.  Albuterol and ipratropium administered via nebulizer.  Patient endorses some improvement in symptoms but continues to experience feeling of shortness of breath.  Administered dexamethasone IM injection and discharge patient to home, advising her to contact  her daughter to inform her of her illness and ask her to check in on her.  Patient should go to the ED for evaluation and treatment if shortness of breath continues or worsens or she develops difficulty breathing.  Counseled  patient on potential for adverse effects with medications prescribed/recommended today, ER and return-to-clinic precautions discussed, patient verbalized understanding and agreement with care plan.  Final Clinical Impressions(s) / UC Diagnoses   Final diagnoses:  None   Discharge Instructions   None    ED Prescriptions   None    PDMP not reviewed this encounter.   Charma Igo, [place] 05/23/23 1029

## 2023-05-30 DIAGNOSIS — R0602 Shortness of breath: Secondary | ICD-10-CM | POA: Diagnosis not present

## 2023-05-30 DIAGNOSIS — J0100 Acute maxillary sinusitis, unspecified: Secondary | ICD-10-CM | POA: Diagnosis not present

## 2023-05-30 DIAGNOSIS — R051 Acute cough: Secondary | ICD-10-CM | POA: Diagnosis not present

## 2023-06-04 ENCOUNTER — Ambulatory Visit
Admission: RE | Admit: 2023-06-04 | Discharge: 2023-06-04 | Disposition: A | Discharge status: Home / Self Care | Payer: PPO | Source: Ambulatory Visit | Attending: Family Medicine | Admitting: Family Medicine

## 2023-06-04 DIAGNOSIS — Z1231 Encounter for screening mammogram for malignant neoplasm of breast: Secondary | ICD-10-CM | POA: Insufficient documentation

## 2023-06-27 DIAGNOSIS — Z79899 Other long term (current) drug therapy: Secondary | ICD-10-CM | POA: Diagnosis not present

## 2023-06-27 DIAGNOSIS — R7303 Prediabetes: Secondary | ICD-10-CM | POA: Diagnosis not present

## 2023-06-27 DIAGNOSIS — E7800 Pure hypercholesterolemia, unspecified: Secondary | ICD-10-CM | POA: Diagnosis not present

## 2023-07-04 DIAGNOSIS — F321 Major depressive disorder, single episode, moderate: Secondary | ICD-10-CM | POA: Diagnosis not present

## 2023-07-04 DIAGNOSIS — G8929 Other chronic pain: Secondary | ICD-10-CM | POA: Diagnosis not present

## 2023-07-04 DIAGNOSIS — F419 Anxiety disorder, unspecified: Secondary | ICD-10-CM | POA: Diagnosis not present

## 2023-07-04 DIAGNOSIS — M545 Low back pain, unspecified: Secondary | ICD-10-CM | POA: Diagnosis not present

## 2023-07-04 DIAGNOSIS — E7800 Pure hypercholesterolemia, unspecified: Secondary | ICD-10-CM | POA: Diagnosis not present

## 2023-07-04 DIAGNOSIS — I10 Essential (primary) hypertension: Secondary | ICD-10-CM | POA: Diagnosis not present

## 2023-07-04 DIAGNOSIS — R7303 Prediabetes: Secondary | ICD-10-CM | POA: Diagnosis not present

## 2023-07-04 DIAGNOSIS — Z79899 Other long term (current) drug therapy: Secondary | ICD-10-CM | POA: Diagnosis not present

## 2023-07-19 DIAGNOSIS — S40861A Insect bite (nonvenomous) of right upper arm, initial encounter: Secondary | ICD-10-CM | POA: Diagnosis not present

## 2023-07-19 DIAGNOSIS — D2262 Melanocytic nevi of left upper limb, including shoulder: Secondary | ICD-10-CM | POA: Diagnosis not present

## 2023-07-19 DIAGNOSIS — D2272 Melanocytic nevi of left lower limb, including hip: Secondary | ICD-10-CM | POA: Diagnosis not present

## 2023-07-19 DIAGNOSIS — D225 Melanocytic nevi of trunk: Secondary | ICD-10-CM | POA: Diagnosis not present

## 2023-07-19 DIAGNOSIS — L565 Disseminated superficial actinic porokeratosis (DSAP): Secondary | ICD-10-CM | POA: Diagnosis not present

## 2023-07-19 DIAGNOSIS — Z85828 Personal history of other malignant neoplasm of skin: Secondary | ICD-10-CM | POA: Diagnosis not present

## 2023-07-19 DIAGNOSIS — D2261 Melanocytic nevi of right upper limb, including shoulder: Secondary | ICD-10-CM | POA: Diagnosis not present

## 2023-07-19 DIAGNOSIS — S40862A Insect bite (nonvenomous) of left upper arm, initial encounter: Secondary | ICD-10-CM | POA: Diagnosis not present

## 2023-07-19 DIAGNOSIS — L814 Other melanin hyperpigmentation: Secondary | ICD-10-CM | POA: Diagnosis not present

## 2023-08-26 DIAGNOSIS — J0100 Acute maxillary sinusitis, unspecified: Secondary | ICD-10-CM | POA: Diagnosis not present

## 2023-09-19 DIAGNOSIS — E7800 Pure hypercholesterolemia, unspecified: Secondary | ICD-10-CM | POA: Diagnosis not present

## 2023-09-19 DIAGNOSIS — R7303 Prediabetes: Secondary | ICD-10-CM | POA: Diagnosis not present

## 2023-09-19 DIAGNOSIS — Z79899 Other long term (current) drug therapy: Secondary | ICD-10-CM | POA: Diagnosis not present

## 2023-09-27 DIAGNOSIS — R7303 Prediabetes: Secondary | ICD-10-CM | POA: Diagnosis not present

## 2023-09-27 DIAGNOSIS — I10 Essential (primary) hypertension: Secondary | ICD-10-CM | POA: Diagnosis not present

## 2023-09-27 DIAGNOSIS — E871 Hypo-osmolality and hyponatremia: Secondary | ICD-10-CM | POA: Diagnosis not present

## 2023-09-27 DIAGNOSIS — E7800 Pure hypercholesterolemia, unspecified: Secondary | ICD-10-CM | POA: Diagnosis not present

## 2023-09-27 DIAGNOSIS — F419 Anxiety disorder, unspecified: Secondary | ICD-10-CM | POA: Diagnosis not present

## 2023-09-27 DIAGNOSIS — M545 Low back pain, unspecified: Secondary | ICD-10-CM | POA: Diagnosis not present

## 2023-09-27 DIAGNOSIS — F321 Major depressive disorder, single episode, moderate: Secondary | ICD-10-CM | POA: Diagnosis not present

## 2023-09-27 DIAGNOSIS — Z23 Encounter for immunization: Secondary | ICD-10-CM | POA: Diagnosis not present

## 2023-09-27 DIAGNOSIS — G8929 Other chronic pain: Secondary | ICD-10-CM | POA: Diagnosis not present

## 2023-09-27 DIAGNOSIS — Z79899 Other long term (current) drug therapy: Secondary | ICD-10-CM | POA: Diagnosis not present

## 2024-04-08 ENCOUNTER — Other Ambulatory Visit: Payer: Self-pay | Admitting: Family Medicine

## 2024-04-08 DIAGNOSIS — R1011 Right upper quadrant pain: Secondary | ICD-10-CM

## 2024-04-09 ENCOUNTER — Ambulatory Visit
Admission: RE | Admit: 2024-04-09 | Discharge: 2024-04-09 | Disposition: A | Discharge status: Home / Self Care | Source: Ambulatory Visit | Attending: Family Medicine | Admitting: Family Medicine

## 2024-04-09 DIAGNOSIS — R1011 Right upper quadrant pain: Secondary | ICD-10-CM | POA: Insufficient documentation

## 2024-07-31 ENCOUNTER — Ambulatory Visit (INDEPENDENT_AMBULATORY_CARE_PROVIDER_SITE_OTHER): Admitting: Internal Medicine

## 2024-07-31 ENCOUNTER — Ambulatory Visit: Payer: Self-pay | Admitting: Internal Medicine

## 2024-07-31 ENCOUNTER — Encounter: Payer: Self-pay | Admitting: Internal Medicine

## 2024-07-31 VITALS — BP 126/70 | HR 73 | Ht 63.0 in | Wt 143.8 lb

## 2024-07-31 DIAGNOSIS — M545 Low back pain, unspecified: Secondary | ICD-10-CM | POA: Diagnosis not present

## 2024-07-31 DIAGNOSIS — E782 Mixed hyperlipidemia: Secondary | ICD-10-CM

## 2024-07-31 DIAGNOSIS — F331 Major depressive disorder, recurrent, moderate: Secondary | ICD-10-CM

## 2024-07-31 DIAGNOSIS — Z96649 Presence of unspecified artificial hip joint: Secondary | ICD-10-CM

## 2024-07-31 DIAGNOSIS — J302 Other seasonal allergic rhinitis: Secondary | ICD-10-CM

## 2024-07-31 DIAGNOSIS — R269 Unspecified abnormalities of gait and mobility: Secondary | ICD-10-CM | POA: Insufficient documentation

## 2024-07-31 DIAGNOSIS — I10 Essential (primary) hypertension: Secondary | ICD-10-CM | POA: Diagnosis not present

## 2024-07-31 DIAGNOSIS — R413 Other amnesia: Secondary | ICD-10-CM | POA: Diagnosis not present

## 2024-07-31 DIAGNOSIS — Z79899 Other long term (current) drug therapy: Secondary | ICD-10-CM | POA: Insufficient documentation

## 2024-07-31 DIAGNOSIS — G8929 Other chronic pain: Secondary | ICD-10-CM | POA: Diagnosis not present

## 2024-07-31 DIAGNOSIS — F411 Generalized anxiety disorder: Secondary | ICD-10-CM

## 2024-07-31 DIAGNOSIS — M159 Polyosteoarthritis, unspecified: Secondary | ICD-10-CM

## 2024-07-31 DIAGNOSIS — Z13820 Encounter for screening for osteoporosis: Secondary | ICD-10-CM | POA: Insufficient documentation

## 2024-07-31 DIAGNOSIS — R7303 Prediabetes: Secondary | ICD-10-CM

## 2024-07-31 DIAGNOSIS — Z1211 Encounter for screening for malignant neoplasm of colon: Secondary | ICD-10-CM | POA: Insufficient documentation

## 2024-07-31 LAB — POCT URINALYSIS DIPSTICK
Bilirubin, UA: NEGATIVE
Glucose, UA: NEGATIVE
Ketones, UA: NEGATIVE
Leukocytes, UA: NEGATIVE
Nitrite, UA: NEGATIVE
Protein, UA: NEGATIVE
Spec Grav, UA: 1.015 (ref 1.010–1.025)
Urobilinogen, UA: 0.2 U/dL
pH, UA: 5.5 (ref 5.0–8.0)

## 2024-07-31 NOTE — Progress Notes (Signed)
 New Patient Office Visit  Subjective   Patient ID: Brittney Ramsey, female    DOB: Jul 01, 1946  Age: 78 y.o. MRN: [id number]  CC:  Chief Complaint  Patient presents with   Establish Care    NPE    HPI Brittney Ramsey presents to establish care Previous Primary Care provider/office:   she does not have additional concerns to discuss today.   Patient is here to establish care as her primary care office. She states she is doing well and denies any new complaints. She is accompanied by her daughter who states she is concerned about her mothers decline in short term memory that has continued to progress over the last few months. Daughter states she she was not taking medications correctly and keeps asking repeat questions and getting zoned out during conversations. Patients PHQ-9 score today is 7; GAD-7 score today is 9. She has never been seen by psychiatrist for her MDD and GAD but due to polypharmacy a referral to psychiatry has been sent. Patient is currently taking bupropion, Duloxetine , Buspirone, and lorazepam . Educated patient and family that memory changes could be age related, TIA/stroke, or in relation to uncontrolled anxiety, or polypharmacy. Will also collect UA to rule out UTI. Ordered CT of head to rule out TIA/stroke. Patient denies fever, chills, pain/burning with urination. Patient and family also reports dysfunctional sleep patterns Will increase Buspar to 10 mg twice daily from once daily. Decreased Lorazepam  to 0.25 mg as needed for anxiety. Patient and family educated on risks and benefits of Lorazepam  and addictive nature. Patient and family agreeable on plan to wean patient off Lorazepam  and capture better control of depression and anxiety with medication adjustments and psychiatric referral.  Patient reports fall approximately 2 weeks ago while outside watering her flowers. Says she was bending over and fell onto her buttock. She had a bruise that has since resolved.  Patient has chronic lumbar pain with spinal fusion and partial right hip replacement. Will send referral for PT to work on gait and strength training. Patient used cane with ambulation to enhance balance and prevent falls. Patient is taking Gabapentin , Cymbalta , Meloxicam, and Tramadol  for pain control. Patient reports that she has not had Tramadol  for over a month. She does not endorse noticing a change in her pain since running out her Tramadol . Will increase Gabapentin  to 200 mg at bedtime, keep morning dose gabapentin  100 mg.  She is also past due for DEXA scan; will order. She had a mammogram 05/2023 and requesting wanting to skip this year. She denies having any abnormal mammograms in the past. Denies family hx of breast cancer. Patients last colon cancer screening was cologuard in 2022; will order as she is now due for repeat screening. Patient is due for routine fasting lab work and will return early next week to be collected.    Outpatient Encounter Medications as of 07/31/2024  Medication Sig   acetaminophen  (TYLENOL ) 500 MG tablet Take 1,000 mg by mouth daily as needed for moderate pain or headache.   albuterol  (VENTOLIN  HFA) 108 (90 Base) MCG/ACT inhaler Inhale into the lungs every 6 (six) hours as needed for wheezing or shortness of breath.   atorvastatin (LIPITOR) 20 MG tablet Take 20 mg by mouth daily.   buPROPion (WELLBUTRIN XL) 150 MG 24 hr tablet Take 150 mg by mouth daily.   busPIRone (BUSPAR) 10 MG tablet Take 10 mg by mouth daily.   Carboxymethylcellul-Glycerin  (LUBRICATING EYE DROPS OP) Place 1 drop into both  eyes daily as needed (dry eyes).   cetirizine (ZYRTEC) 10 MG tablet Take 10 mg by mouth daily.   Cholecalciferol (VITAMIN D-3) 125 MCG (5000 UT) TABS Take by mouth daily.   diclofenac Sodium (VOLTAREN) 1 % GEL Apply topically as needed.   docusate sodium  (COLACE) 100 MG capsule Take 1 capsule (100 mg total) by mouth 2 (two) times daily.   DULoxetine  (CYMBALTA ) 60 MG  capsule Take 60 mg by mouth 2 (two) times daily. (Patient taking differently: Take 60 mg by mouth daily.)   fluticasone  (FLONASE ) 50 MCG/ACT nasal spray Place 2 sprays into both nostrils daily as needed for allergies.    gabapentin  (NEURONTIN ) 100 MG capsule Take 100 mg by mouth 3 (three) times daily as needed. (Patient taking differently: Take 100 mg by mouth 2 (two) times daily.)   Lidocaine  4 % PTCH Apply 1 patch topically daily as needed (pain).   LORazepam  (ATIVAN ) 0.5 MG tablet Take 0.5 mg by mouth at bedtime.    meloxicam (MOBIC) 7.5 MG tablet Take 7.5 mg by mouth 2 (two) times daily. (Patient taking differently: Take 7.5 mg by mouth daily.)   Misc Natural Products (OSTEO BI-FLEX ADV TRIPLE ST) TABS Take 1 tablet by mouth daily.   montelukast  (SINGULAIR ) 10 MG tablet Take 10 mg by mouth daily.   Omega-3 Fatty Acids (FISH OIL PO) Take by mouth daily.   pantoprazole  (PROTONIX ) 40 MG tablet Take 1 tablet (40 mg total) by mouth daily.   polyethylene glycol (MIRALAX / GLYCOLAX) packet Take 17 g by mouth daily as needed.   telmisartan-hydrochlorothiazide  (MICARDIS HCT) 80-12.5 MG tablet Take 1 tablet by mouth daily.   traMADol  (ULTRAM ) 50 MG tablet Take 50 mg by mouth See admin instructions. Take 50 mg daily, may take a second 50 mg dose as needed for pain   Coenzyme Q10 (COQ10 PO) Take by mouth daily. (Patient not taking: Reported on 07/31/2024)   Cyanocobalamin (VITAMIN B-12 PO) Take by mouth daily. (Patient not taking: Reported on 07/31/2024)   Probiotic CAPS Take 1 capsule by mouth daily. (Patient not taking: Reported on 07/31/2024)   [DISCONTINUED] losartan -hydrochlorothiazide  (HYZAAR) 100-25 MG tablet Take 1 tablet by mouth daily. (Patient not taking: Reported on 07/31/2024)   [DISCONTINUED] simvastatin  (ZOCOR ) 20 MG tablet Take 20 mg by mouth daily at 6 PM. (Patient not taking: Reported on 07/31/2024)   No facility-administered encounter medications on file as of 07/31/2024.    Past Medical  History:  Diagnosis Date   Allergic rhinitis    Allergy    Anxiety    Arthritis    Complication of anesthesia    Hyperlipemia    Hypertension    Neuropathy    legs   PONV (postoperative nausea and vomiting)     Past Surgical History:  Procedure Laterality Date   CATARACT EXTRACTION W/PHACO Right 01/14/2023   Procedure: CATARACT EXTRACTION PHACO AND INTRAOCULAR LENS PLACEMENT (IOC) RIGHT  20.00  01:36.9;  Surgeon: Enola Feliciano Hugger, MD;  Location: [facility];  Service: Ophthalmology;  Laterality: Right;   CATARACT EXTRACTION W/PHACO Left 01/24/2023   Procedure: CATARACT EXTRACTION PHACO AND INTRAOCULAR LENS PLACEMENT (IOC) LEFT  10.51  00:49.3;  Surgeon: Enola Feliciano Hugger, MD;  Location: [facility];  Service: Ophthalmology;  Laterality: Left;   COLONOSCOPY     ESOPHAGEAL MANOMETRY N/A 12/21/2015   Procedure: ESOPHAGEAL MANOMETRY (EM);  Surgeon: Donnice Vaughn Manes, MD;  Location: [facility] ENDOSCOPY;  Service: Endoscopy;  Laterality: N/A;   ESOPHAGOGASTRODUODENOSCOPY (EGD) WITH PROPOFOL   N/A 11/04/2015   Procedure: ESOPHAGOGASTRODUODENOSCOPY (EGD) WITH PROPOFOL ;  Surgeon: Deward CINDERELLA Piedmont, MD;  Location: [facility] ENDOSCOPY;  Service: Endoscopy;  Laterality: N/A;   HIP ARTHROPLASTY Right 08/03/2019   Procedure: ARTHROPLASTY BIPOLAR HIP (HEMIARTHROPLASTY);  Surgeon: Mardee Lynwood SQUIBB, MD;  Location: [facility] ORS;  Service: Orthopedics;  Laterality: Right;   L3-4, L4-5 decompression and fusion  10/20/2018   Dr. Chyrl Budge   TUBAL LIGATION      Family History  Problem Relation Age of Onset   Breast cancer Neg Hx     Social History   Socioeconomic History   Marital status: Widowed    Spouse name: Not on file   Number of children: Not on file   Years of education: Not on file   Highest education level: Not on file  Occupational History   Not on file  Tobacco Use   Smoking status: Former    Types: Cigarettes   Smokeless tobacco: Never   Tobacco comments:    Smoked for  about 5 years in her 77s  Vaping Use   Vaping status: Never Used  Substance and Sexual Activity   Alcohol use: No   Drug use: Never   Sexual activity: Not on file  Other Topics Concern   Not on file  Social History Narrative   Not on file   Social Drivers of Health   Financial Resource Strain: Patient Declined (01/10/2024)   Received from [facility]   Overall Financial Resource Strain (CARDIA)    Difficulty of Paying Living Expenses: Patient declined  Food Insecurity: Patient Declined (01/10/2024)   Received from [facility]   Hunger Vital Sign    Within the past 12 months, you worried that your food would run out before you got the money to buy more.: Patient declined    Within the past 12 months, the food you bought just didn't last and you didn't have money to get more.: Patient declined  Transportation Needs: Patient Declined (01/10/2024)   Received from [facility] - Transportation    In the past 12 months, has lack of transportation kept you from medical appointments or from getting medications?: Patient declined    Lack of Transportation (Non-Medical): Patient declined  Physical Activity: Not on file  Stress: Not on file  Social Connections: Not on file  Intimate Partner Violence: Not on file    Review of Systems  Constitutional: Negative.  Negative for chills, fever and malaise/fatigue.  HENT: Negative.  Negative for congestion and sore throat.   Eyes: Negative.  Negative for blurred vision and pain.  Respiratory: Negative.  Negative for cough and shortness of breath.   Cardiovascular: Negative.  Negative for chest pain, palpitations and leg swelling.  Gastrointestinal: Negative.  Negative for abdominal pain, blood in stool, constipation, diarrhea, heartburn, melena, nausea and vomiting.  Genitourinary: Negative.  Negative for dysuria, flank pain, frequency and urgency.  Musculoskeletal:  Positive for back  pain and joint pain. Negative for myalgias.  Skin: Negative.   Neurological: Negative.  Negative for dizziness, tingling, sensory change, weakness and headaches.  Endo/Heme/Allergies: Negative.   Psychiatric/Behavioral:  Positive for depression. Negative for suicidal ideas. The patient is nervous/anxious.         Objective   BP 126/70   Pulse 73   Ht 5' 3 (1.6 m)   Wt 143 lb 12.8 oz (65.2 kg)   SpO2 95%   BMI 25.47 kg/m   Physical Exam Vitals  and nursing note reviewed.  Constitutional:      Appearance: Normal appearance.  HENT:     Head: Normocephalic and atraumatic.     Nose: Nose normal.     Mouth/Throat:     Mouth: Mucous membranes are moist.     Pharynx: Oropharynx is clear.  Eyes:     Conjunctiva/sclera: Conjunctivae normal.     Pupils: Pupils are equal, round, and reactive to light.  Cardiovascular:     Rate and Rhythm: Normal rate and regular rhythm.     Pulses: Normal pulses.     Heart sounds: Normal heart sounds. No murmur heard. Pulmonary:     Effort: Pulmonary effort is normal.     Breath sounds: Normal breath sounds. No wheezing.  Abdominal:     General: Bowel sounds are normal.     Palpations: Abdomen is soft.     Tenderness: There is no abdominal tenderness. There is no right CVA tenderness or left CVA tenderness.  Musculoskeletal:        General: Normal range of motion.     Cervical back: Normal range of motion.     Right lower leg: No edema.     Left lower leg: No edema.  Skin:    General: Skin is warm and dry.  Neurological:     General: No focal deficit present.     Mental Status: She is alert and oriented to person, place, and time.  Psychiatric:        Mood and Affect: Mood normal.        Behavior: Behavior normal.        Assessment & Plan:  Start gabapentin  200 mg at bedtime, keep am dose at 100 mg. Reduce lorazepam  to 0.25 mg as needed for anxiety. Increase Buspar to 10 mg twice daily. Psychiatric referral sent. Referral for PT  through home health sent. Will collect UA today rule out UTI memory impairment. DEXA scan ordered. Cologuard ordered. Patient will return for routine fasting blood work. Problem List Items Addressed This Visit     HTN (hypertension) - Primary   Relevant Medications   atorvastatin (LIPITOR) 20 MG tablet   telmisartan-hydrochlorothiazide  (MICARDIS HCT) 80-12.5 MG tablet   Other Relevant Orders   Vitamin D (25 hydroxy)   Vitamin B12   CBC with Diff   CMP14+EGFR   Lipid Panel w/o Chol/HDL Ratio   TSH+T4F+T3Free   HLD (hyperlipidemia)   Relevant Medications   atorvastatin (LIPITOR) 20 MG tablet   telmisartan-hydrochlorothiazide  (MICARDIS HCT) 80-12.5 MG tablet   Other Relevant Orders   CBC with Diff   CMP14+EGFR   Lipid Panel w/o Chol/HDL Ratio   Allergic rhinitis, seasonal   Chronic midline low back pain without sciatica   Relevant Medications   buPROPion (WELLBUTRIN XL) 150 MG 24 hr tablet   Other Relevant Orders   Ambulatory referral to Home Health   POCT Urinalysis Dipstick (18997) (Completed)   Generalized anxiety disorder   Relevant Medications   buPROPion (WELLBUTRIN XL) 150 MG 24 hr tablet   Other Relevant Orders   Ambulatory referral to Psychiatry   Osteoarthritis   Relevant Orders   Ambulatory referral to Home Health   Status post hip hemiarthroplasty   Relevant Orders   Ambulatory referral to Home Health   Colon cancer screening   Prediabetes   Relevant Orders   Hemoglobin A1c   Screening for osteoporosis   Relevant Orders   HM DEXA SCAN (Completed)   MDD (major depressive disorder), recurrent episode,  moderate (HCC)   Relevant Medications   buPROPion (WELLBUTRIN XL) 150 MG 24 hr tablet   Other Relevant Orders   Ambulatory referral to Psychiatry   Polypharmacy   Relevant Orders   Ambulatory referral to Psychiatry   Memory changes   Relevant Orders   CT HEAD WO CONTRAST ( )   POCT Urinalysis Dipstick (18997) (Completed)   Gait disturbance   Relevant  Orders   Ambulatory referral to Home Health    Return in about 3 weeks (around 08/21/2024).   Total time spent: 45 minutes  FERNAND FREDY RAMAN, MD  07/31/2024   This document may have been prepared by [facility] Voice Recognition software and as such may include unintentional dictation errors.

## 2024-08-04 ENCOUNTER — Other Ambulatory Visit: Payer: Self-pay

## 2024-08-04 MED ORDER — ATORVASTATIN CALCIUM 20 MG PO TABS: 20.0000 mg | ORAL_TABLET | Freq: Every day | ORAL | 1 refills | Status: AC

## 2024-08-04 MED ORDER — BUPROPION HCL ER (XL) 150 MG PO TB24: 150.0000 mg | ORAL_TABLET | Freq: Every day | ORAL | 1 refills | Status: AC

## 2024-08-04 MED ORDER — BUSPIRONE HCL 10 MG PO TABS: 10.0000 mg | ORAL_TABLET | Freq: Two times a day (BID) | ORAL | 1 refills | Status: AC

## 2024-08-04 MED ORDER — DULOXETINE HCL 60 MG PO CPEP: 60.0000 mg | ORAL_CAPSULE | Freq: Every day | ORAL | 1 refills | Status: AC

## 2024-08-04 MED ORDER — MELOXICAM 7.5 MG PO TABS: 7.5000 mg | ORAL_TABLET | Freq: Every day | ORAL | 1 refills | Status: DC

## 2024-08-04 MED ORDER — MONTELUKAST SODIUM 10 MG PO TABS: 10.0000 mg | ORAL_TABLET | Freq: Every day | ORAL | 1 refills | Status: AC

## 2024-08-04 MED ORDER — CETIRIZINE HCL 10 MG PO TABS: 10.0000 mg | ORAL_TABLET | Freq: Every day | ORAL | 1 refills | Status: AC

## 2024-08-04 MED ORDER — PANTOPRAZOLE SODIUM 40 MG PO TBEC: 40.0000 mg | DELAYED_RELEASE_TABLET | Freq: Every day | ORAL | 1 refills | Status: AC

## 2024-08-04 MED ORDER — GABAPENTIN 100 MG PO CAPS: 100.0000 mg | ORAL_CAPSULE | Freq: Three times a day (TID) | ORAL | 1 refills | Status: AC

## 2024-08-04 NOTE — Addendum Note (Signed)
 Addended by: FERNAND FREDY RAMAN on: 08/04/2024 03:45 PM   Modules accepted: Orders

## 2024-08-05 ENCOUNTER — Other Ambulatory Visit

## 2024-08-05 DIAGNOSIS — R7303 Prediabetes: Secondary | ICD-10-CM

## 2024-08-05 DIAGNOSIS — I10 Essential (primary) hypertension: Secondary | ICD-10-CM

## 2024-08-05 DIAGNOSIS — E782 Mixed hyperlipidemia: Secondary | ICD-10-CM

## 2024-08-06 LAB — CBC WITH DIFFERENTIAL/PLATELET
Basophils Absolute: 0 x10E3/uL (ref 0.0–0.2)
Basos: 1 %
EOS (ABSOLUTE): 0.2 x10E3/uL (ref 0.0–0.4)
Eos: 4 %
Hematocrit: 32.9 % — ABNORMAL LOW (ref 34.0–46.6)
Hemoglobin: 10.8 g/dL — ABNORMAL LOW (ref 11.1–15.9)
Immature Grans (Abs): 0 x10E3/uL (ref 0.0–0.1)
Immature Granulocytes: 0 %
Lymphocytes Absolute: 1.2 x10E3/uL (ref 0.7–3.1)
Lymphs: 25 %
MCH: 31.8 pg (ref 26.6–33.0)
MCHC: 32.8 g/dL (ref 31.5–35.7)
MCV: 97 fL (ref 79–97)
Monocytes Absolute: 0.5 x10E3/uL (ref 0.1–0.9)
Monocytes: 10 %
Neutrophils Absolute: 2.9 x10E3/uL (ref 1.4–7.0)
Neutrophils: 60 %
Platelets: 305 x10E3/uL (ref 150–450)
RBC: 3.4 x10E6/uL — ABNORMAL LOW (ref 3.77–5.28)
RDW: 13.2 % (ref 11.7–15.4)
WBC: 4.8 x10E3/uL (ref 3.4–10.8)

## 2024-08-06 LAB — LIPID PANEL W/O CHOL/HDL RATIO
Cholesterol, Total: 167 mg/dL (ref 100–199)
HDL: 60 mg/dL (ref >39)
LDL Chol Calc (NIH): 90 mg/dL (ref 0–99)
Triglycerides: 91 mg/dL (ref 0–149)
VLDL Cholesterol Cal: 17 mg/dL (ref 5–40)

## 2024-08-06 LAB — CMP14+EGFR
ALT: 17 IU/L (ref 0–32)
AST: 21 IU/L (ref 0–40)
Albumin: 4.1 g/dL (ref 3.8–4.8)
Alkaline Phosphatase: 87 IU/L (ref 49–135)
BUN/Creatinine Ratio: 24 (ref 12–28)
BUN: 25 mg/dL (ref 8–27)
Bilirubin Total: 0.3 mg/dL (ref 0.0–1.2)
CO2: 23 mmol/L (ref 20–29)
Calcium: 9.2 mg/dL (ref 8.7–10.3)
Chloride: 100 mmol/L (ref 96–106)
Creatinine, Ser: 1.06 mg/dL — ABNORMAL HIGH (ref 0.57–1.00)
Globulin, Total: 2.3 g/dL (ref 1.5–4.5)
Glucose: 88 mg/dL (ref 70–99)
Potassium: 4.2 mmol/L (ref 3.5–5.2)
Sodium: 137 mmol/L (ref 134–144)
Total Protein: 6.4 g/dL (ref 6.0–8.5)
eGFR: 53 mL/min/1.73 — ABNORMAL LOW (ref >59)

## 2024-08-06 LAB — VITAMIN B12: Vitamin B-12: 866 pg/mL (ref 232–1245)

## 2024-08-06 LAB — HEMOGLOBIN A1C
Est. average glucose Bld gHb Est-mCnc: 117 mg/dL
Hgb A1c MFr Bld: 5.7 % — ABNORMAL HIGH (ref 4.8–5.6)

## 2024-08-06 LAB — TSH+T4F+T3FREE
Free T4: 0.7 ng/dL — ABNORMAL LOW (ref 0.82–1.77)
T3, Free: 2.3 pg/mL (ref 2.0–4.4)
TSH: 4.2 u[IU]/mL (ref 0.450–4.500)

## 2024-08-06 LAB — VITAMIN D 25 HYDROXY (VIT D DEFICIENCY, FRACTURES): Vit D, 25-Hydroxy: 54.6 ng/mL (ref 30.0–100.0)

## 2024-08-10 ENCOUNTER — Telehealth: Payer: Self-pay | Admitting: Internal Medicine

## 2024-08-10 NOTE — Telephone Encounter (Signed)
 [place], PT with [facility], left VM requesting verbal orders for PT 2w2 then 1w2.  Spoke with [employer] and gave verbal orders for PT.

## 2024-08-21 ENCOUNTER — Ambulatory Visit: Admitting: Internal Medicine

## 2024-09-29 ENCOUNTER — Other Ambulatory Visit: Payer: Self-pay

## 2024-09-29 MED ORDER — TELMISARTAN-HCTZ 80-12.5 MG PO TABS
1.0000 | ORAL_TABLET | Freq: Every day | ORAL | 1 refills | Status: AC
Start: 2024-09-29 — End: ?

## 2024-09-29 NOTE — Telephone Encounter (Signed)
 Can you send refill for my grandma's bp rx? Please advise  Also she will be making a follow up appt with you soon- she is finally having the CT done tomorrow

## 2024-09-30 ENCOUNTER — Ambulatory Visit
Admission: RE | Admit: 2024-09-30 | Discharge: 2024-09-30 | Disposition: A | Discharge status: Home / Self Care | Source: Ambulatory Visit | Attending: Internal Medicine | Admitting: Internal Medicine

## 2024-09-30 DIAGNOSIS — R413 Other amnesia: Secondary | ICD-10-CM | POA: Diagnosis present

## 2024-10-06 ENCOUNTER — Telehealth: Payer: Self-pay

## 2024-10-06 DIAGNOSIS — G8929 Other chronic pain: Secondary | ICD-10-CM

## 2024-10-06 DIAGNOSIS — M159 Polyosteoarthritis, unspecified: Secondary | ICD-10-CM

## 2024-10-06 MED ORDER — MELOXICAM 7.5 MG PO TABS: 7.5000 mg | ORAL_TABLET | Freq: Two times a day (BID) | ORAL | 2 refills | Status: AC | PRN

## 2024-10-06 MED ORDER — MELOXICAM 15 MG PO TABS: 15.0000 mg | ORAL_TABLET | Freq: Every day | ORAL | 1 refills | Status: DC

## 2024-10-06 NOTE — Addendum Note (Signed)
 Addended by: FERNAND FREDY RAMAN on: 10/06/2024 03:28 PM   Modules accepted: Orders

## 2024-10-06 NOTE — Progress Notes (Signed)
 Patient notified

## 2024-10-06 NOTE — Telephone Encounter (Signed)
 Pt's rx meloxicam  7.5mg  is written for once daily, we discussed at appt to up rx to twice daily. Can you send a corrected rx for directions to say twice daily? She is out and needs corrected rx sent to [facility]. Please advise

## 2024-12-24 ENCOUNTER — Ambulatory Visit: Admitting: Internal Medicine
